# Patient Record
Sex: Female | Born: 1955
Health system: Southern US, Community
[De-identification: ages and names within clinical notes are randomized; demographics above are authoritative.]

## PROBLEM LIST (undated history)

## (undated) DIAGNOSIS — G4700 Insomnia, unspecified: Secondary | ICD-10-CM

## (undated) DIAGNOSIS — I493 Ventricular premature depolarization: Secondary | ICD-10-CM

## (undated) DIAGNOSIS — M8580 Other specified disorders of bone density and structure, unspecified site: Secondary | ICD-10-CM

## (undated) DIAGNOSIS — H269 Unspecified cataract: Secondary | ICD-10-CM

## (undated) DIAGNOSIS — I10 Essential (primary) hypertension: Secondary | ICD-10-CM

## (undated) DIAGNOSIS — E785 Hyperlipidemia, unspecified: Secondary | ICD-10-CM

## (undated) DIAGNOSIS — K219 Gastro-esophageal reflux disease without esophagitis: Secondary | ICD-10-CM

## (undated) DIAGNOSIS — E282 Polycystic ovarian syndrome: Secondary | ICD-10-CM

## (undated) DIAGNOSIS — F32A Depression, unspecified: Secondary | ICD-10-CM

## (undated) DIAGNOSIS — I341 Nonrheumatic mitral (valve) prolapse: Secondary | ICD-10-CM

## (undated) DIAGNOSIS — Z973 Presence of spectacles and contact lenses: Secondary | ICD-10-CM

## (undated) DIAGNOSIS — R5383 Other fatigue: Secondary | ICD-10-CM

## (undated) DIAGNOSIS — R7303 Prediabetes: Secondary | ICD-10-CM

## (undated) DIAGNOSIS — M1990 Unspecified osteoarthritis, unspecified site: Secondary | ICD-10-CM

## (undated) DIAGNOSIS — F419 Anxiety disorder, unspecified: Secondary | ICD-10-CM

## (undated) DIAGNOSIS — R739 Hyperglycemia, unspecified: Secondary | ICD-10-CM

## (undated) DIAGNOSIS — R011 Cardiac murmur, unspecified: Secondary | ICD-10-CM

## (undated) DIAGNOSIS — T7840XA Allergy, unspecified, initial encounter: Secondary | ICD-10-CM

## (undated) DIAGNOSIS — K589 Irritable bowel syndrome without diarrhea: Secondary | ICD-10-CM

## (undated) DIAGNOSIS — M797 Fibromyalgia: Secondary | ICD-10-CM

## (undated) DIAGNOSIS — E039 Hypothyroidism, unspecified: Secondary | ICD-10-CM

## (undated) DIAGNOSIS — F329 Major depressive disorder, single episode, unspecified: Secondary | ICD-10-CM

## (undated) HISTORY — DX: Other fatigue: R53.83

## (undated) HISTORY — DX: Hyperglycemia, unspecified: R73.9

## (undated) HISTORY — DX: Polycystic ovarian syndrome: E28.2

## (undated) HISTORY — PX: ABDOMINAL HYSTERECTOMY: SHX81

## (undated) HISTORY — DX: Allergy, unspecified, initial encounter: T78.40XA

## (undated) HISTORY — PX: CHOLECYSTECTOMY: SHX55

## (undated) HISTORY — PX: TONSILLECTOMY: SUR1361

## (undated) HISTORY — DX: Depression, unspecified: F32.A

## (undated) HISTORY — DX: Irritable bowel syndrome, unspecified: K58.9

## (undated) HISTORY — DX: Essential (primary) hypertension: I10

## (undated) HISTORY — DX: Presence of spectacles and contact lenses: Z97.3

## (undated) HISTORY — DX: Anxiety disorder, unspecified: F41.9

## (undated) HISTORY — DX: Hypothyroidism, unspecified: E03.9

## (undated) HISTORY — DX: Major depressive disorder, single episode, unspecified: F32.9

## (undated) HISTORY — PX: COLONOSCOPY: SHX174

## (undated) HISTORY — DX: Nonrheumatic mitral (valve) prolapse: I34.1

## (undated) HISTORY — DX: Unspecified cataract: H26.9

## (undated) HISTORY — DX: Prediabetes: R73.03

## (undated) HISTORY — DX: Gastro-esophageal reflux disease without esophagitis: K21.9

## (undated) HISTORY — PX: OVARY SURGERY: SHX727

## (undated) HISTORY — DX: Insomnia, unspecified: G47.00

## (undated) HISTORY — DX: Unspecified osteoarthritis, unspecified site: M19.90

## (undated) HISTORY — DX: Other specified disorders of bone density and structure, unspecified site: M85.80

## (undated) HISTORY — DX: Ventricular premature depolarization: I49.3

## (undated) HISTORY — DX: Cardiac murmur, unspecified: R01.1

## (undated) HISTORY — DX: Fibromyalgia: M79.7

## (undated) HISTORY — DX: Hyperlipidemia, unspecified: E78.5

---

## 1997-10-15 ENCOUNTER — Other Ambulatory Visit: Admission: RE | Admit: 1997-10-15 | Discharge: 1997-10-15 | Payer: Self-pay | Admitting: *Deleted

## 1998-09-10 ENCOUNTER — Ambulatory Visit: Admission: RE | Admit: 1998-09-10 | Discharge: 1998-09-10 | Payer: Self-pay | Admitting: Family Medicine

## 1998-11-06 ENCOUNTER — Other Ambulatory Visit: Admission: RE | Admit: 1998-11-06 | Discharge: 1998-11-06 | Payer: Self-pay | Admitting: *Deleted

## 1999-12-01 ENCOUNTER — Other Ambulatory Visit: Admission: RE | Admit: 1999-12-01 | Discharge: 1999-12-01 | Payer: Self-pay | Admitting: *Deleted

## 2000-12-14 ENCOUNTER — Other Ambulatory Visit: Admission: RE | Admit: 2000-12-14 | Discharge: 2000-12-14 | Payer: Self-pay | Admitting: *Deleted

## 2000-12-26 ENCOUNTER — Encounter: Admission: RE | Admit: 2000-12-26 | Discharge: 2000-12-26 | Payer: Self-pay | Admitting: *Deleted

## 2000-12-26 ENCOUNTER — Encounter: Payer: Self-pay | Admitting: *Deleted

## 2001-07-27 ENCOUNTER — Encounter: Admission: RE | Admit: 2001-07-27 | Discharge: 2001-07-27 | Payer: Self-pay | Admitting: Family Medicine

## 2001-07-27 ENCOUNTER — Encounter: Payer: Self-pay | Admitting: Family Medicine

## 2001-08-07 ENCOUNTER — Encounter: Admission: RE | Admit: 2001-08-07 | Discharge: 2001-08-07 | Payer: Self-pay | Admitting: Family Medicine

## 2001-08-07 ENCOUNTER — Encounter: Payer: Self-pay | Admitting: Family Medicine

## 2001-12-19 ENCOUNTER — Other Ambulatory Visit: Admission: RE | Admit: 2001-12-19 | Discharge: 2001-12-19 | Payer: Self-pay | Admitting: *Deleted

## 2002-08-22 ENCOUNTER — Emergency Department (HOSPITAL_COMMUNITY): Admission: EM | Admit: 2002-08-22 | Discharge: 2002-08-22 | Payer: Self-pay | Admitting: Emergency Medicine

## 2002-08-22 ENCOUNTER — Encounter: Payer: Self-pay | Admitting: Emergency Medicine

## 2002-12-18 ENCOUNTER — Encounter: Payer: Self-pay | Admitting: *Deleted

## 2002-12-18 ENCOUNTER — Encounter: Admission: RE | Admit: 2002-12-18 | Discharge: 2002-12-18 | Payer: Self-pay | Admitting: *Deleted

## 2002-12-25 ENCOUNTER — Other Ambulatory Visit: Admission: RE | Admit: 2002-12-25 | Discharge: 2002-12-25 | Payer: Self-pay | Admitting: *Deleted

## 2003-05-28 ENCOUNTER — Ambulatory Visit (HOSPITAL_COMMUNITY): Admission: RE | Admit: 2003-05-28 | Discharge: 2003-05-28 | Payer: Self-pay | Admitting: Family Medicine

## 2003-11-28 ENCOUNTER — Emergency Department (HOSPITAL_COMMUNITY): Admission: EM | Admit: 2003-11-28 | Discharge: 2003-11-28 | Payer: Self-pay | Admitting: Emergency Medicine

## 2003-12-19 ENCOUNTER — Encounter: Admission: RE | Admit: 2003-12-19 | Discharge: 2003-12-19 | Payer: Self-pay | Admitting: *Deleted

## 2004-01-16 ENCOUNTER — Other Ambulatory Visit: Admission: RE | Admit: 2004-01-16 | Discharge: 2004-01-16 | Payer: Self-pay | Admitting: *Deleted

## 2004-12-29 ENCOUNTER — Encounter: Admission: RE | Admit: 2004-12-29 | Discharge: 2004-12-29 | Payer: Self-pay | Admitting: *Deleted

## 2005-06-03 ENCOUNTER — Encounter: Admission: RE | Admit: 2005-06-03 | Discharge: 2005-06-03 | Payer: Self-pay | Admitting: Family Medicine

## 2005-10-21 ENCOUNTER — Encounter: Admission: RE | Admit: 2005-10-21 | Discharge: 2005-10-21 | Payer: Self-pay | Admitting: Family Medicine

## 2006-02-10 ENCOUNTER — Other Ambulatory Visit: Admission: RE | Admit: 2006-02-10 | Discharge: 2006-02-10 | Payer: Self-pay | Admitting: *Deleted

## 2006-06-30 ENCOUNTER — Ambulatory Visit (HOSPITAL_COMMUNITY): Admission: RE | Admit: 2006-06-30 | Discharge: 2006-06-30 | Payer: Self-pay | Admitting: *Deleted

## 2006-11-09 ENCOUNTER — Inpatient Hospital Stay (HOSPITAL_COMMUNITY): Admission: EM | Admit: 2006-11-09 | Discharge: 2006-11-11 | Payer: Self-pay | Admitting: Emergency Medicine

## 2008-02-15 ENCOUNTER — Encounter: Admission: RE | Admit: 2008-02-15 | Discharge: 2008-02-15 | Payer: Self-pay | Admitting: Family Medicine

## 2008-11-21 ENCOUNTER — Encounter: Admission: RE | Admit: 2008-11-21 | Discharge: 2008-11-21 | Payer: Self-pay | Admitting: Family Medicine

## 2008-12-26 ENCOUNTER — Encounter: Admission: RE | Admit: 2008-12-26 | Discharge: 2008-12-26 | Payer: Self-pay | Admitting: Family Medicine

## 2009-08-17 ENCOUNTER — Encounter: Admission: RE | Admit: 2009-08-17 | Discharge: 2009-08-17 | Payer: Self-pay | Admitting: Family Medicine

## 2009-12-17 ENCOUNTER — Encounter: Admission: RE | Admit: 2009-12-17 | Discharge: 2009-12-17 | Payer: Self-pay | Admitting: Obstetrics and Gynecology

## 2010-09-01 NOTE — H&P (Signed)
Glenda Evans, Glenda Evans              ACCOUNT NO.:  [id number]   MEDICAL RECORD NO.:  [id number]          PATIENT TYPE:  INP   LOCATION:  6734                         FACILITY:  [facility]   PHYSICIAN:  Corinna L. Lendell Caprice, MDDATE OF BIRTH:  1956-04-04   DATE OF ADMISSION:  11/09/2006  DATE OF DISCHARGE:                              HISTORY & PHYSICAL   CHIEF COMPLAINT:  Diarrhea and fainting spell.   HISTORY OF PRESENT ILLNESS:  Glenda Evans is a pleasant 55 year old white  female patient of Dr. Tiburcio Pea who presents to the emergency room with the  above complaints.  She cares for her 33-month-old [place] who was  recently diagnosed with C. difficile colitis.  Also the patient's mother  died of C. difficile related issues last year.  The patient reports that  beginning 2 days ago, she began having diarrhea approximately 20-30  times that day.  The subsequent day, she had diffuse abdominal pain.  Her appetite has been poor and she has been nauseated.  She has not  vomited.  She has been trying to drink liquids.  She has subjective  fevers and chills.  She drinks well water.  No recent travel.  No  previous similar episodes.  On Monday, she had maybe a bit of blood in  the stool but none since.  It has been watery.  Her diarrhea diminished  yesterday, but this morning about 4:00 a.m. she got up to go the  bathroom and apparently she had a syncopal episode, and when she awoke  she was covered in loose stool.  She also had a syncopal episode in  triage for a brief amount of time.  Currently she is feeling nauseated  and having quite a bit of abdominal pain.  She rates it 7 out of 10.   PAST MEDICAL HISTORY:  Depression, fibromyalgia, gastroesophageal reflux  disease, mitral valve prolapse, hypothyroidism.   MEDICATIONS:  1. Verapamil dose unknown.  2. Synthroid 112 mcg a day.  3. Wellbutrin XL 150 mg a day.  4. Prilosec 40 mg a day.   ALLERGIES:  She reports allergies.  She reports a rash  with SULFA.  She  reports an allergy with FLOXIN, but she is able to take Cipro and other  fluoroquinolones.  She reports an allergy to [facility].  She reports an  allergy to AUGMENTIN, but is able to take amoxicillin and other  penicillins.  These allergies consist of a swollen tongue, shortness of  breath, throat tightening.   SOCIAL HISTORY:  She is engaged to be married.  She does not smoke,  drink or use drugs.   FAMILY HISTORY:  As above.   SURGICAL HISTORY:  She has had a hysterectomy, cholecystectomy, C-  section.   REVIEW OF SYSTEMS:  As above otherwise negative.   PHYSICAL EXAMINATION:  VITAL SIGNS:  Her temperature is 97.5, blood  pressure of 90/57, pulse 86, respiratory rate 14, oxygen saturation 97.  Her blood pressure is currently 118/75.  GENERAL:  In general the patient is an ill-appearing white female.  HEENT: Normocephalic, atraumatic.  Pupils equal, round and reactive to  light.  Sclerae nonicteric.  Conjunctivae pink.  She has moist mucous  membranes.  Oropharynx is without erythema or exudate.  NECK:  Neck is supple.  No lymphadenopathy.  LUNGS:  Lungs are clear to auscultation bilaterally without wheezes,  rhonchi or rales.  CARDIOVASCULAR:  Regular rate and rhythm without murmurs, gallops or  rubs.  ABDOMEN:  Normal bowel sounds, soft.  She is diffusely tender with some  mild rebound tenderness, worse on the left lower quadrant.  GU AND RECTAL:  Deferred.  EXTREMITIES: No clubbing, cyanosis or edema.  SKIN:  No rash.  PSYCHIATRIC:  Normal affect.  NEUROLOGIC:  Alert and oriented.  Cranial nerves and sensorimotor exam  are intact.   LABORATORIES:  CBC is essentially normal.  Basic metabolic panel  significant for a bicarbonate of 19, potassium of 3.2, otherwise  unremarkable.   ASSESSMENT/PLAN:  1. Syncope probably secondary to hypotension, dehydration.  The      patient will get IV fluids.  She will be admitted to the hospital.  2. Abdominal pain  with diarrhea.  Suspect Clostridium difficile      colitis.  The patient has received a dose of IV Flagyl.  I will try      to change this to oral if at all possible.  She will be on contact      precautions.  We will check stool for Clostridium difficile.  I      will also check stool for Giardia and Cryptosporidia with her      history of well water consumption.  Also check stool culture.  She      is having quite a bit of pain and nausea, so she will get Dilaudid      as needed and Phenergan as needed.  She is quite tender.  I suspect      it is from colitis, but I will check a CT abdomen, pelvis to rule      out other intra-abdominal process.  3. Mitral valve prolapse.  Hold verapamil.  4. Gastroesophageal reflux disease.  Give IV Protonix for now.  5. Depression.  Continue Wellbutrin if tolerated.  6. Hypothyroidism.  Check TSH and continue Synthroid if tolerated.  7. Fibromyalgia, stable.  8. Hypokalemia.  This will be repleted IV.      Corinna L. Lendell Caprice, MD  Electronically Signed     CLS/MEDQ  D:  11/09/2006  T:  11/09/2006  Job:  [id number]   cc:   Holley Bouche, M.D.

## 2010-09-01 NOTE — Discharge Summary (Signed)
Glenda Evans, Glenda Evans              ACCOUNT NO.:  [id number]   MEDICAL RECORD NO.:  [id number]          PATIENT TYPE:  INP   LOCATION:  6734                         FACILITY:  [facility]   PHYSICIAN:  Corinna L. Lendell Caprice, MDDATE OF BIRTH:  April 27, 1955   DATE OF ADMISSION:  11/09/2006  DATE OF DISCHARGE:  11/11/2006                               DISCHARGE SUMMARY   DISCHARGE DIAGNOSES:  1. Syncope.  2. Infectious enteritis with diarrhea  3. Hypokalemia.  4. Hypothyroidism.  5. Gastroesophageal reflux disease  6. Mitral valve prolapse.  7. History of depression.  8. Fibromyalgia.   DISCHARGE MEDICATIONS:  1. Phenergan 12-1/2 mg p.o. q.6 h p.r.n. nausea  2. Ciprofloxacin 500 mg p.o. b.i.d. until gone  3. Metronidazole 250 mg p.o. four times a week, until gone.  4. She may resume her verapamil, Synthroid, Wellbutrin and Prilosec as      previous.  See H&P.   CONDITION:  Stable.   ACTIVITY:  Ad lib.   DIET:  Should be as tolerated with adequate fluid intake, avoid dairy  products for a week.   FOLLOW UP:  Follow with Dr. Tiburcio Pea next week to check serum potassium  and follow up symptoms.   CONSULTATIONS:  None.   PROCEDURES:  None.   PERTINENT LABORATORY DATA:  On admission, CBC unremarkable.  Basic  metabolic panel significant for potassium of 3.2.  Liver function tests  significant for an SGOT of 39, SGPT of 53, total protein 5.4, albumin  3.1, lipase normal.  TSH 1.579.  Fecal lactoferrin positive.  Giardia  screen negative.  Cryptosporidium screen negative, C diff toxin negative  x1.  Stool culture negative to date though results or not final.   SPECIAL STUDIES AND RADIOLOGY:  CT of the abdomen and pelvis showed  dilated, mildly thickened loops of small bowel with small amount of free  fluid, nonspecific, may be enteritis.   HISTORY AND HOSPITAL COURSE:  Glenda Evans is a pleasant 55 year old  white female, patient of Dr. Tiburcio Pea who had profuse diarrhea 2 days  prior to  admission.  She also had anorexia and nausea.  Her  granddaughter was recently diagnosed with Salmonella and apparently they  all ate at the same [place].  The granddaughter was  subsequently diagnosed with Clostridium difficile.  The patient's  diarrhea had improved but on the morning of admission she had a syncopal  episode while in the bathroom and when she awoke she had loose stool.  Her blood pressure was borderline in the emergency room.  She was alert  and oriented but was feeling very weak. She had also described some  abdominal pain.  She is on verapamil dose unknown, for mitral valve  prolapse.  Her other vital signs were normal.  She had moist mucous  membranes but appeared weak and ill.  She had diffuse abdominal  tenderness with some mild rebound tenderness worst in the left lower  quadrant.  She was started empirically on Flagyl.  CAT scan showed the  enteritis.  She subsequently was also started on ciprofloxacin.  Her  diarrhea did not return since  the morning of admission but she continued  to have abdominal pain and nausea.  She was started initially on clear  liquid diet.  She reports an allergy to Floxin but has been able to take  Cipro in the past and has suffered no ill effects.  On the day of  discharge she was tolerating a solid diet.  She was feeling better.  She  was ambulating without dizziness and her abdomen was less tender.  I  have repleted her potassium IV n.p.o. and she will need to have a  potassium level checked again in a week.  I will continue empiric  antibiotics and follow up the final results of the stool culture.      Corinna L. Lendell Caprice, MD  Electronically Signed     CLS/MEDQ  D:  11/11/2006  T:  11/11/2006  Job:  [id number]   cc:   Holley Bouche, M.D.

## 2010-11-18 ENCOUNTER — Encounter (INDEPENDENT_AMBULATORY_CARE_PROVIDER_SITE_OTHER): Payer: Self-pay | Admitting: General Surgery

## 2010-11-19 ENCOUNTER — Encounter (INDEPENDENT_AMBULATORY_CARE_PROVIDER_SITE_OTHER): Payer: Self-pay | Admitting: General Surgery

## 2010-11-19 ENCOUNTER — Ambulatory Visit (INDEPENDENT_AMBULATORY_CARE_PROVIDER_SITE_OTHER): Payer: Medicare Other | Admitting: General Surgery

## 2010-11-19 VITALS — BP 142/98 | HR 76 | Temp 97.6°F | Ht 65.0 in | Wt 192.6 lb

## 2010-11-19 DIAGNOSIS — D1739 Benign lipomatous neoplasm of skin and subcutaneous tissue of other sites: Secondary | ICD-10-CM

## 2010-11-19 DIAGNOSIS — D1720 Benign lipomatous neoplasm of skin and subcutaneous tissue of unspecified limb: Secondary | ICD-10-CM

## 2010-11-19 NOTE — Progress Notes (Signed)
Glenda Evans is a 55 y.o. female.    Chief Complaint  Patient presents with  . Other    eval painful lipoma Lt leg    HPI HPI 55 year old obese Caucasian female referred by Dr. Johny Blamer for evaluation of bilateral thigh lipomas. She states that she's had lipomas for several years. However over the past couple months the masses  have been  causing more and more discomfort. It causes discomfort all the time. She complains of numbness and weakness down both legs. She denies any weight loss. She denies any family or personal history of soft tissue cancers such as sarcomas. The lumps that cause her the most discomfort are on the left anterior thigh and the right inner and anterior thigh. She denies any night sweats or chills. She also has a lump on her right hip area but it does not bother her at the current time.  Past Medical History  Diagnosis Date  . Fibromyalgia   . Mitral valve prolapse   . Hypothyroid   . Depression   . IBS (irritable bowel syndrome)   . Asthma   . Migraine   . GERD (gastroesophageal reflux disease)   . Stein-Leventhal syndrome   . Fatigue   . Arthritis   . Wears glasses     Past Surgical History  Procedure Date  . Cesarean section   . Tonsillectomy   . Abdominal hysterectomy   . Cholecystectomy     Family History  Problem Relation Age of Onset  . COPD Father   . Hypertension Father   . Diabetes Sister   . Cancer Maternal Grandmother     breast cancer    Social History History  Substance Use Topics  . Smoking status: Never Smoker   . Smokeless tobacco: Not on file  . Alcohol Use: No    Allergies  Allergen Reactions  . Augmentin   . Cymbalta (Duloxetine Hcl)   . Erythromycin   . Floxin Otic   . Lexapro   . Lyrica (Pregabalin)   . Macrodantin   . Neurontin (Gabapentin)   . Pamelor   . Relafen (Nabumetone)   . Septra (Bactrim)   . Skelaxin   . Sulfa Antibiotics     Current Outpatient Prescriptions  Medication Sig  Dispense Refill  . buPROPion (WELLBUTRIN XL) 300 MG 24 hr tablet Take 300 mg by mouth daily.        . calcium carbonate (TUMS - DOSED IN MG ELEMENTAL CALCIUM) 500 MG chewable tablet Chew 1 tablet by mouth 3 (three) times daily.        . Cholecalciferol (VITAMIN D) 2000 UNITS CAPS Take by mouth once a week.       . citalopram (CELEXA) 20 MG tablet Take 20 mg by mouth daily.        . clonazePAM (KLONOPIN) 0.5 MG tablet Take 0.5 mg by mouth 2 (two) times daily as needed.        Marland Kitchen estradiol (ESTRACE) 1 MG tablet Take 1 mg by mouth daily.        Marland Kitchen levothyroxine (SYNTHROID, LEVOTHROID) 112 MCG tablet Take 112 mcg by mouth daily.        Marland Kitchen omeprazole (PRILOSEC) 20 MG capsule Take 20 mg by mouth daily.        . verapamil (CALAN) 120 MG tablet Take 120 mg by mouth 3 (three) times daily.        Marland Kitchen albuterol (PROVENTIL) 90 MCG/ACT inhaler Inhale 2 puffs into the lungs  as needed.       . fish oil-omega-3 fatty acids 1000 MG capsule Take 2 g by mouth daily.          Review of Systems Review of Systems  Constitutional: Positive for malaise/fatigue. Negative for fever, chills and weight loss.  HENT: Negative for nosebleeds and congestion.   Eyes: Negative for blurred vision and double vision.       +glasses  Respiratory: Negative for shortness of breath and wheezing.        H/o asthma, uses inhaler occasionally. No recent hospitalizations for asthma  Cardiovascular: Negative for chest pain, orthopnea, leg swelling and PND.       Some DOE  Gastrointestinal: Positive for heartburn. Negative for diarrhea, constipation, blood in stool and melena.  Genitourinary: Negative for dysuria and hematuria.  Musculoskeletal: Positive for joint pain.  Skin:       See hpi  Neurological: Positive for headaches (occassional). Negative for dizziness, sensory change, focal weakness and seizures.  Endo/Heme/Allergies: Does not bruise/bleed easily.  Psychiatric/Behavioral: Positive for depression. Negative for suicidal  ideas and substance abuse.    Physical Exam Physical Exam  Vitals reviewed. Constitutional: She is oriented to person, place, and time. She appears well-developed and well-nourished.       obese  HENT:  Head: Normocephalic and atraumatic.  Eyes: Conjunctivae are normal. No scleral icterus.  Neck: Normal range of motion. Neck supple.  Cardiovascular: Normal rate, regular rhythm, normal heart sounds and intact distal pulses.   Respiratory: Effort normal and breath sounds normal. No respiratory distress. She has no wheezes.  GI: Soft. Bowel sounds are normal. She exhibits no distension and no mass. There is no tenderness.       Well low transverse skin incision  Musculoskeletal: Normal range of motion.  Lymphadenopathy:    She has no cervical adenopathy.       Right: No inguinal and no supraclavicular adenopathy present.       Left: No inguinal and no supraclavicular adenopathy present.  Neurological: She is alert and oriented to person, place, and time.  Skin: Skin is warm and dry. No rash noted. No erythema.     Psychiatric: She has a normal mood and affect. Her behavior is normal. Judgment normal.     Blood pressure 142/98, pulse 76, temperature 97.6 F (36.4 C), height 5\' 5"  (1.651 m), weight 192 lb 9.6 oz (87.363 kg).   Data reviewed: I reviewed Dr. Johny Blamer note from November 09, 2010  Assessment/Plan 55 year old obese Caucasian female with fibromyalgia, hypothyroidism, depression, remote history of asthma, gastroesophageal reflux disease, vitamin D deficiency, and bilateral thigh subcutaneous masses most consistent with lipomas on physical exam  We discussed operative and nonoperative management. The patient is interested in  surgical excision of these 3 lesions. We discussed the risk and benefits of surgery including but not limited to bleeding, infection, injury to surrounding structures, hematoma formation, seroma formation, blood clot formation, scarring and the rare  remote possibility of cancer being detected in these masses.  We discussed the typical postoperative recovery course. We will plan on excising the left thigh, right inner thigh, and right anterior thigh subcutaneous lipomas.  I explained to her that I cannot guarantee that the numbness and weakness that she is experiencing in her legs will improve with excision of these lipomas.  Gaynelle Adu M 11/19/2010, 9:54 AM

## 2010-11-19 NOTE — Patient Instructions (Signed)

## 2010-11-25 ENCOUNTER — Encounter (HOSPITAL_BASED_OUTPATIENT_CLINIC_OR_DEPARTMENT_OTHER)
Admission: RE | Admit: 2010-11-25 | Discharge: 2010-11-25 | Disposition: A | Discharge status: Home / Self Care | Payer: Medicare Other | Source: Ambulatory Visit | Attending: General Surgery | Admitting: General Surgery

## 2010-11-25 LAB — CBC
HCT: 40.2 % (ref 36.0–46.0)
MCHC: 33.6 g/dL (ref 30.0–36.0)
Platelets: 283 10*3/uL (ref 150–400)

## 2010-11-25 LAB — DIFFERENTIAL
Eosinophils Relative: 2 % (ref 0–5)
Lymphocytes Relative: 31 % (ref 12–46)
Lymphs Abs: 2.3 10*3/uL (ref 0.7–4.0)
Monocytes Absolute: 0.5 10*3/uL (ref 0.1–1.0)
Monocytes Relative: 6 % (ref 3–12)
Neutrophils Relative %: 61 % (ref 43–77)

## 2010-11-25 LAB — BASIC METABOLIC PANEL
Potassium: 4.3 mEq/L (ref 3.5–5.1)
Sodium: 140 mEq/L (ref 135–145)

## 2010-11-26 ENCOUNTER — Ambulatory Visit (HOSPITAL_BASED_OUTPATIENT_CLINIC_OR_DEPARTMENT_OTHER)
Admission: RE | Admit: 2010-11-26 | Discharge: 2010-11-26 | Disposition: A | Discharge status: Home / Self Care | Payer: Medicare Other | Source: Ambulatory Visit | Attending: General Surgery | Admitting: General Surgery

## 2010-11-26 ENCOUNTER — Other Ambulatory Visit (INDEPENDENT_AMBULATORY_CARE_PROVIDER_SITE_OTHER): Payer: Self-pay | Admitting: General Surgery

## 2010-11-26 DIAGNOSIS — Z01810 Encounter for preprocedural cardiovascular examination: Secondary | ICD-10-CM | POA: Insufficient documentation

## 2010-11-26 DIAGNOSIS — D1739 Benign lipomatous neoplasm of skin and subcutaneous tissue of other sites: Secondary | ICD-10-CM | POA: Insufficient documentation

## 2010-11-26 DIAGNOSIS — Z01812 Encounter for preprocedural laboratory examination: Secondary | ICD-10-CM | POA: Insufficient documentation

## 2010-11-26 HISTORY — PX: LIPOMA EXCISION: SHX5283

## 2010-11-28 NOTE — Op Note (Signed)
[name], Glenda Evans              ACCOUNT NO.:  [id number]  MEDICAL RECORD NO.:  [id number]  LOCATION:                                 FACILITY:  PHYSICIAN:  Mary Sella. Andrey Campanile, MD     DATE OF BIRTH:  02/16/1956  DATE OF PROCEDURE:  11/26/2010 DATE OF DISCHARGE:                              OPERATIVE REPORT   PREOPERATIVE DIAGNOSIS:  Bilateral thighs subcutaneous masses.  POSTOPERATIVE DIAGNOSIS:  Bilateral thigh lipomas.  PROCEDURE: 1. Excision of left anterior thigh lipoma with two-layer closure. 2. Excision of right medial thigh lipoma x2, each with two-layer     closure.  SURGEON:  Mary Sella. Andrey Campanile, MD  ASSISTANT SURGEON:  None.  ANESTHESIA:  General with LMA plus local consisting of 0.25% Marcaine with epi.  SPECIMEN: 1. Left thigh subcutaneous mass. 2. Right thigh subcutaneous masses.  FINDINGS: 1. The left leg incision was 4 cm long. 2. The right medial thigh most medial incision was 2 cm and the outer     incision was 3 cm long.  ESTIMATED BLOOD LOSS:  Minimal.  INDICATIONS FOR PROCEDURE:  The patient is a 55 year old female who has had a longstanding history of subcutaneous masses in her lower legs over the past couple of months and has been causing more and more discomfort. Now, she has constant discomfort mainly over the area where the lumps were located.  She denies any weight loss, night sweats or fevers or chills.  She denies any family or personal history of soft tissue cancer.  We discussed the risks and benefits of surgery including but not limited to bleeding, infection, injury to surrounding structures, failure to ameliorate her discomfort, hematoma formation, seroma formation, scarring, possible need for additional procedures as well as typical postoperative course as well as general anesthesia risk.  She elects to proceed to the operating room.  DESCRIPTION OF PROCEDURE:  The bilateral thighs were marked in the holding area with the patient  confirming the operative site.  I had her re-identify the lesion, the subcutaneous masses that were particularly bothering her and these were outlined in the holding bay.  She was then taken into the operating room, placed supine on the operating room table.  General LMA anesthesia was established.  Her bilateral thighs and inner thighs were prepped and draped in usual standard surgical fashion with ChloraPrep.  She received antibiotics prior to skin incision.  Surgical time-out was performed.  Initially, I started on the left thigh lesion in anterior medial position several inches above the kneecap.  I made a transverse incision directly over the lesion of concern.  This was about 4 cm.  I then was able to express the lipoma up out of the incision.  I then freed it up from the surrounding tissue with the Bovie electrocautery.  I probed the cavity with my finger. There was one additional little lipoma underneath the inferior skin edge.  I was able to express this by palpating on the skin.  It essentially popped out.  The cavity was irrigated.  Hemostasis was achieved.  I injected local in the deep dermis, reapproximated the skin in two layers, the first layer being a deep dermis  with inverted interrupted 3-0 Vicryls, the skin with a running 4-0 Monocryl in a subcuticular fashion, followed by Benzoin, Steri-Strips, a 4x4 and a Tegaderm.  I then turned my attention to the right leg.  There was one in the medial distal thigh.  I made again transverse incision directly over it of approximately 2 cm in length, was able to express the lipoma from underneath the skin.  Irrigated the wound.  The other lesion was slightly more lateral near the right anterior thigh.  I made a 3-cm incision through the skin with a #15 blade, lifted up the skin edges with the Adson and took some Bovie electrocautery to lift the skin edges both superior and inferiorly.  I again was able to express the lipoma. There  was one lipoma that was further lateral and I was able to tunnel underneath the skin and express it through the same defect.  Both incisions were irrigated.  Local was infiltrated.  I then closed each incision with interrupted inverted 3-0 Vicryl for the skin with a running 4-0 Monocryl in subcuticular fashion followed by Steri-Strips, 2 x2s and 2 Tegaderms.  The patient was extubated and taken to the recovery room in stable addition.  There were no immediate complications.  The patient tolerated the procedure well.  All needle and sponge counts were correct x 2.     Mary Sella. Andrey Campanile, MD     EMW/MEDQ  D:  11/26/2010  T:  11/26/2010  Job:  [id number]  cc:   Melida Quitter, M.D.  Electronically Signed by Gaynelle Adu M.D. on 11/28/2010 12:13:43 PM

## 2010-11-30 ENCOUNTER — Telehealth (INDEPENDENT_AMBULATORY_CARE_PROVIDER_SITE_OTHER): Payer: Self-pay | Admitting: General Surgery

## 2010-11-30 NOTE — Telephone Encounter (Signed)
Message copied by Liliana Cline on Mon Nov 30, 2010  9:01 AM ------      Message from: Andrey Campanile, ERIC M      Created: Fri Nov 27, 2010  9:02 PM       Call pt with path report.

## 2010-11-30 NOTE — Telephone Encounter (Signed)
Spoke with patient. Advised this was a benign lipoma, no cancer. Patient is doing well. Had some nausea after surgery. I advised that is normal right after surgery, especially if taking pain medicine. Patient has now stopped pain medicine and has not felt sick today. I advised if she feels sick again to give [place] a call, but to keep an eye on this. She will call back with any problems and follow up at her scheduled appt.

## 2010-12-07 ENCOUNTER — Telehealth (INDEPENDENT_AMBULATORY_CARE_PROVIDER_SITE_OTHER): Payer: Self-pay | Admitting: General Surgery

## 2010-12-07 ENCOUNTER — Encounter (INDEPENDENT_AMBULATORY_CARE_PROVIDER_SITE_OTHER): Payer: Self-pay | Admitting: General Surgery

## 2010-12-07 ENCOUNTER — Ambulatory Visit (INDEPENDENT_AMBULATORY_CARE_PROVIDER_SITE_OTHER): Payer: Medicare Other | Admitting: General Surgery

## 2010-12-07 VITALS — Temp 97.1°F

## 2010-12-07 DIAGNOSIS — D1739 Benign lipomatous neoplasm of skin and subcutaneous tissue of other sites: Secondary | ICD-10-CM

## 2010-12-07 DIAGNOSIS — Z09 Encounter for follow-up examination after completed treatment for conditions other than malignant neoplasm: Secondary | ICD-10-CM

## 2010-12-07 DIAGNOSIS — D1720 Benign lipomatous neoplasm of skin and subcutaneous tissue of unspecified limb: Secondary | ICD-10-CM

## 2010-12-07 NOTE — Telephone Encounter (Signed)
Patient called in stating she developed bilateral itchy red rash on lower extremities, patient given appointment with Dr. Andrey Campanile for today (12/07/10)

## 2010-12-07 NOTE — Patient Instructions (Signed)
Can take motrin or aleve for Left wrist discomfort. Also try warm packs.  Can also take benadryl for itching.

## 2010-12-07 NOTE — Progress Notes (Signed)
Procedure: Status post excision of bilateral thigh lipomas November 26, 2010  History of Present Ilness: 55 year old Caucasian female comes in for her first postoperative appointment. She states the first couple days went okay. However over the weekend she developed some redness and itching over all 3 incision sites. The redness has gone away now. She denies any fevers or chills. She took Benadryl which did give her some relief. She also reports some bloody drainage from the lateral right thigh incision that happened on Sunday. She also complains of some left wrist discomfort. She states that is where her IV site was located. She denies any redness to the area. She denies any swelling.  Physical Exam: Temp(Src) 97.1 F (36.2 C) (Temporal)  Well-developed well-nourished obese Caucasian female in no apparent distress Left wrist-no edema. Palpable pulse. Small bruise on the dorsum of the left hand. Slightly tender to palpation. No gross deformity. Skin-bilateral thigh incisions are well approximated. Steri-Strips have been removed. There is no cellulitis or fluctuance. There is no induration. There may be a little bit of fluid underneath the right lateral thigh incision.  Pathology: Bilateral thigh subcutaneous masses were consistent with lipomas. No evidence of atypia or malignancy  Assessment and Plan: Status post excision of bilateral thigh lipomas. We discussed the pathology report. It appears that she might have a contact dermatitis reaction to Steri-Strips. I've asked her to add this to her allergy list. There's no sign of infection. I advised her to take Benadryl as needed for itching. I also encouraged her to take Motrin or Aleve for her left wrist discomfort. I believe this will improve with time. I will see her in 4 weeks

## 2010-12-10 ENCOUNTER — Encounter (INDEPENDENT_AMBULATORY_CARE_PROVIDER_SITE_OTHER): Payer: Medicare Other | Admitting: General Surgery

## 2011-01-06 ENCOUNTER — Ambulatory Visit (INDEPENDENT_AMBULATORY_CARE_PROVIDER_SITE_OTHER): Payer: Medicare Other | Admitting: General Surgery

## 2011-01-06 ENCOUNTER — Encounter (INDEPENDENT_AMBULATORY_CARE_PROVIDER_SITE_OTHER): Payer: Medicare Other | Admitting: General Surgery

## 2011-01-06 ENCOUNTER — Encounter (INDEPENDENT_AMBULATORY_CARE_PROVIDER_SITE_OTHER): Payer: Self-pay | Admitting: General Surgery

## 2011-01-06 VITALS — BP 116/88 | HR 60 | Temp 97.2°F | Resp 18 | Ht 65.0 in | Wt 194.0 lb

## 2011-01-06 DIAGNOSIS — Z09 Encounter for follow-up examination after completed treatment for conditions other than malignant neoplasm: Secondary | ICD-10-CM

## 2011-01-06 NOTE — Progress Notes (Signed)
Procedure: Status post excision of bilateral thigh lipomas November 26, 2010  History of Present Ilness: 55 year old Caucasian female comes in for her second postoperative appointment. She states she is doing well. She denies any drainage or swelling around her incisions.  Her left leg no longer bothers her. Her right leg will occasionally be sore. She denies any redness to the area. She denies any swelling. Her left wrist discomfort has resolved.    Physical Exam: BP 116/88  Pulse 60  Temp 97.2 F (36.2 C)  Resp 18  Ht 5\' 5"  (1.651 m)  Wt 194 lb (87.998 kg)  BMI 32.28 kg/m2  Well-developed well-nourished obese Caucasian female in no apparent distress  Skin-bilateral thigh incisions are well approximated. There is no cellulitis or fluctuance. There is no induration. There is no evidence of seromas or hematomas  Pathology: Bilateral thigh subcutaneous masses were consistent with lipomas. No evidence of atypia or malignancy  Assessment and Plan: Status post excision of bilateral thigh lipomas.  She appears to be doing well.  Her incisions are completely healed.  I have released her to full activities. I will her on a prn basis

## 2011-01-27 ENCOUNTER — Other Ambulatory Visit: Payer: Self-pay | Admitting: Obstetrics and Gynecology

## 2011-01-27 DIAGNOSIS — Z1231 Encounter for screening mammogram for malignant neoplasm of breast: Secondary | ICD-10-CM

## 2011-02-01 LAB — TSH: TSH: 1.579

## 2011-02-01 LAB — DIFFERENTIAL
Basophils Absolute: 0
Eosinophils Absolute: 0
Eosinophils Relative: 1
Monocytes Absolute: 0.9 — ABNORMAL HIGH

## 2011-02-01 LAB — I-STAT 8, (EC8 V) (CONVERTED LAB)
BUN: 13
Glucose, Bld: 123 — ABNORMAL HIGH
Potassium: 3.2 — ABNORMAL LOW
TCO2: 20
pH, Ven: 7.285

## 2011-02-01 LAB — BASIC METABOLIC PANEL
CO2: 24
Chloride: 114 — ABNORMAL HIGH
Creatinine, Ser: 0.83
GFR calc Af Amer: >60
Potassium: 3.2 — ABNORMAL LOW
Sodium: 146 — ABNORMAL HIGH

## 2011-02-01 LAB — HEPATIC FUNCTION PANEL
ALT: 53 — ABNORMAL HIGH
AST: 39 — ABNORMAL HIGH
Albumin: 3.1 — ABNORMAL LOW
Bilirubin, Direct: <0.1

## 2011-02-01 LAB — POCT I-STAT CREATININE: Operator id: 277751

## 2011-02-01 LAB — CBC
HCT: 35.9 — ABNORMAL LOW
Hemoglobin: 12.4
MCV: 91.7
Platelets: 234
RDW: 12.3

## 2011-02-01 LAB — STOOL CULTURE

## 2011-02-01 LAB — CLOSTRIDIUM DIFFICILE EIA

## 2011-02-01 LAB — GIARDIA/CRYPTOSPORIDIUM SCREEN(EIA)
Cryptosporidium Screen (EIA): NEGATIVE
Giardia Screen - EIA: NEGATIVE

## 2011-02-01 LAB — FECAL LACTOFERRIN, QUANT

## 2011-02-10 ENCOUNTER — Ambulatory Visit: Payer: Medicare Other

## 2011-02-18 ENCOUNTER — Ambulatory Visit: Payer: Medicare Other

## 2011-05-06 DIAGNOSIS — J029 Acute pharyngitis, unspecified: Secondary | ICD-10-CM | POA: Diagnosis not present

## 2011-05-13 ENCOUNTER — Ambulatory Visit
Admission: RE | Admit: 2011-05-13 | Discharge: 2011-05-13 | Disposition: A | Discharge status: Home / Self Care | Payer: Medicare Other | Source: Ambulatory Visit | Attending: Obstetrics and Gynecology | Admitting: Obstetrics and Gynecology

## 2011-05-13 DIAGNOSIS — Z1231 Encounter for screening mammogram for malignant neoplasm of breast: Secondary | ICD-10-CM

## 2011-06-01 DIAGNOSIS — J0100 Acute maxillary sinusitis, unspecified: Secondary | ICD-10-CM | POA: Diagnosis not present

## 2011-06-07 DIAGNOSIS — M279 Disease of jaws, unspecified: Secondary | ICD-10-CM | POA: Diagnosis not present

## 2011-06-07 DIAGNOSIS — H9209 Otalgia, unspecified ear: Secondary | ICD-10-CM | POA: Diagnosis not present

## 2011-06-07 DIAGNOSIS — G43909 Migraine, unspecified, not intractable, without status migrainosus: Secondary | ICD-10-CM | POA: Diagnosis not present

## 2011-06-23 DIAGNOSIS — M542 Cervicalgia: Secondary | ICD-10-CM | POA: Diagnosis not present

## 2011-06-23 DIAGNOSIS — M7530 Calcific tendinitis of unspecified shoulder: Secondary | ICD-10-CM | POA: Diagnosis not present

## 2011-07-12 DIAGNOSIS — J45909 Unspecified asthma, uncomplicated: Secondary | ICD-10-CM | POA: Diagnosis not present

## 2011-07-12 DIAGNOSIS — E559 Vitamin D deficiency, unspecified: Secondary | ICD-10-CM | POA: Diagnosis not present

## 2011-07-12 DIAGNOSIS — R5383 Other fatigue: Secondary | ICD-10-CM | POA: Diagnosis not present

## 2011-07-12 DIAGNOSIS — E785 Hyperlipidemia, unspecified: Secondary | ICD-10-CM | POA: Diagnosis not present

## 2011-07-12 DIAGNOSIS — IMO0001 Reserved for inherently not codable concepts without codable children: Secondary | ICD-10-CM | POA: Diagnosis not present

## 2011-07-12 DIAGNOSIS — E039 Hypothyroidism, unspecified: Secondary | ICD-10-CM | POA: Diagnosis not present

## 2011-07-12 DIAGNOSIS — F329 Major depressive disorder, single episode, unspecified: Secondary | ICD-10-CM | POA: Diagnosis not present

## 2011-07-12 DIAGNOSIS — K589 Irritable bowel syndrome without diarrhea: Secondary | ICD-10-CM | POA: Diagnosis not present

## 2011-07-12 DIAGNOSIS — K219 Gastro-esophageal reflux disease without esophagitis: Secondary | ICD-10-CM | POA: Diagnosis not present

## 2011-07-14 DIAGNOSIS — M7530 Calcific tendinitis of unspecified shoulder: Secondary | ICD-10-CM | POA: Diagnosis not present

## 2011-07-19 DIAGNOSIS — M5030 Other cervical disc degeneration, unspecified cervical region: Secondary | ICD-10-CM | POA: Diagnosis not present

## 2011-07-19 DIAGNOSIS — M542 Cervicalgia: Secondary | ICD-10-CM | POA: Diagnosis not present

## 2011-07-19 DIAGNOSIS — M4712 Other spondylosis with myelopathy, cervical region: Secondary | ICD-10-CM | POA: Diagnosis not present

## 2011-07-23 DIAGNOSIS — M4712 Other spondylosis with myelopathy, cervical region: Secondary | ICD-10-CM | POA: Diagnosis not present

## 2011-08-27 DIAGNOSIS — R6889 Other general symptoms and signs: Secondary | ICD-10-CM | POA: Diagnosis not present

## 2011-08-27 DIAGNOSIS — R51 Headache: Secondary | ICD-10-CM | POA: Diagnosis not present

## 2011-08-27 DIAGNOSIS — R269 Unspecified abnormalities of gait and mobility: Secondary | ICD-10-CM | POA: Diagnosis not present

## 2011-08-27 DIAGNOSIS — M47812 Spondylosis without myelopathy or radiculopathy, cervical region: Secondary | ICD-10-CM | POA: Diagnosis not present

## 2011-09-03 ENCOUNTER — Other Ambulatory Visit: Payer: Self-pay | Admitting: Neurology

## 2011-09-03 DIAGNOSIS — R269 Unspecified abnormalities of gait and mobility: Secondary | ICD-10-CM

## 2011-09-03 DIAGNOSIS — M4712 Other spondylosis with myelopathy, cervical region: Secondary | ICD-10-CM

## 2011-09-09 ENCOUNTER — Ambulatory Visit
Admission: RE | Admit: 2011-09-09 | Discharge: 2011-09-09 | Disposition: A | Discharge status: Home / Self Care | Payer: Medicare Other | Source: Ambulatory Visit | Attending: Neurology | Admitting: Neurology

## 2011-09-09 DIAGNOSIS — M4712 Other spondylosis with myelopathy, cervical region: Secondary | ICD-10-CM

## 2011-09-09 DIAGNOSIS — M47812 Spondylosis without myelopathy or radiculopathy, cervical region: Secondary | ICD-10-CM | POA: Diagnosis not present

## 2011-09-09 DIAGNOSIS — R269 Unspecified abnormalities of gait and mobility: Secondary | ICD-10-CM

## 2011-09-09 DIAGNOSIS — R51 Headache: Secondary | ICD-10-CM | POA: Diagnosis not present

## 2011-09-15 DIAGNOSIS — T148XXA Other injury of unspecified body region, initial encounter: Secondary | ICD-10-CM | POA: Diagnosis not present

## 2011-09-15 DIAGNOSIS — Z23 Encounter for immunization: Secondary | ICD-10-CM | POA: Diagnosis not present

## 2011-09-15 DIAGNOSIS — W010XXA Fall on same level from slipping, tripping and stumbling without subsequent striking against object, initial encounter: Secondary | ICD-10-CM | POA: Diagnosis not present

## 2011-09-15 DIAGNOSIS — S51009A Unspecified open wound of unspecified elbow, initial encounter: Secondary | ICD-10-CM | POA: Diagnosis not present

## 2011-09-15 DIAGNOSIS — S42409A Unspecified fracture of lower end of unspecified humerus, initial encounter for closed fracture: Secondary | ICD-10-CM | POA: Diagnosis not present

## 2011-09-15 DIAGNOSIS — S59919A Unspecified injury of unspecified forearm, initial encounter: Secondary | ICD-10-CM | POA: Diagnosis not present

## 2011-09-15 DIAGNOSIS — E7800 Pure hypercholesterolemia, unspecified: Secondary | ICD-10-CM | POA: Diagnosis not present

## 2011-09-20 DIAGNOSIS — M79609 Pain in unspecified limb: Secondary | ICD-10-CM | POA: Diagnosis not present

## 2011-09-22 DIAGNOSIS — M25539 Pain in unspecified wrist: Secondary | ICD-10-CM | POA: Diagnosis not present

## 2011-09-29 DIAGNOSIS — S5000XA Contusion of unspecified elbow, initial encounter: Secondary | ICD-10-CM | POA: Diagnosis not present

## 2011-12-08 DIAGNOSIS — E785 Hyperlipidemia, unspecified: Secondary | ICD-10-CM | POA: Diagnosis not present

## 2012-01-06 DIAGNOSIS — R197 Diarrhea, unspecified: Secondary | ICD-10-CM | POA: Diagnosis not present

## 2012-01-06 DIAGNOSIS — K219 Gastro-esophageal reflux disease without esophagitis: Secondary | ICD-10-CM | POA: Diagnosis not present

## 2012-01-06 DIAGNOSIS — J45909 Unspecified asthma, uncomplicated: Secondary | ICD-10-CM | POA: Diagnosis not present

## 2012-01-06 DIAGNOSIS — E785 Hyperlipidemia, unspecified: Secondary | ICD-10-CM | POA: Diagnosis not present

## 2012-01-06 DIAGNOSIS — IMO0001 Reserved for inherently not codable concepts without codable children: Secondary | ICD-10-CM | POA: Diagnosis not present

## 2012-01-06 DIAGNOSIS — K589 Irritable bowel syndrome without diarrhea: Secondary | ICD-10-CM | POA: Diagnosis not present

## 2012-01-06 DIAGNOSIS — E559 Vitamin D deficiency, unspecified: Secondary | ICD-10-CM | POA: Diagnosis not present

## 2012-01-06 DIAGNOSIS — E039 Hypothyroidism, unspecified: Secondary | ICD-10-CM | POA: Diagnosis not present

## 2012-01-06 DIAGNOSIS — F329 Major depressive disorder, single episode, unspecified: Secondary | ICD-10-CM | POA: Diagnosis not present

## 2012-02-07 ENCOUNTER — Encounter: Payer: Self-pay | Admitting: Gastroenterology

## 2012-02-10 ENCOUNTER — Encounter: Payer: Self-pay | Admitting: Gastroenterology

## 2012-02-10 ENCOUNTER — Ambulatory Visit (INDEPENDENT_AMBULATORY_CARE_PROVIDER_SITE_OTHER): Payer: Medicare Other | Admitting: Gastroenterology

## 2012-02-10 ENCOUNTER — Telehealth: Payer: Self-pay | Admitting: Gastroenterology

## 2012-02-10 VITALS — BP 106/80 | HR 64 | Ht 64.75 in | Wt 192.2 lb

## 2012-02-10 DIAGNOSIS — Z8371 Family history of colonic polyps: Secondary | ICD-10-CM | POA: Diagnosis not present

## 2012-02-10 DIAGNOSIS — R197 Diarrhea, unspecified: Secondary | ICD-10-CM

## 2012-02-10 MED ORDER — PEG-KCL-NACL-NASULF-NA ASC-C 100 G PO SOLR: 1.0000 | Freq: Once | ORAL | Status: DC

## 2012-02-10 MED ORDER — HYOSCYAMINE SULFATE 0.125 MG SL SUBL: SUBLINGUAL_TABLET | SUBLINGUAL | Status: DC

## 2012-02-10 NOTE — Telephone Encounter (Signed)
Patient states that Levsin is too expensive even with the generic. Can we switch her to Bentyl? It is covered by her insurance.

## 2012-02-10 NOTE — Telephone Encounter (Signed)
OK to change to dicyclomine 10 mg po ac, 1 month supply, 6 refills

## 2012-02-10 NOTE — Patient Instructions (Addendum)
You have been scheduled for a colonoscopy with propofol. Please follow written instructions given to you at your visit today.  Please pick up your prep kit at the pharmacy within the next 1-3 days. If you use inhalers (even only as needed), please bring them with you on the day of your procedure.   Your physician has requested that you go to the basement for the following lab work before leaving today: Stool studies.  We have sent the following medications to your pharmacy for you to pick up at your convenience: Levsin.  cc: Johny Blamer, MD

## 2012-02-10 NOTE — Telephone Encounter (Signed)
Left a message for patient to return my call. 

## 2012-02-10 NOTE — Progress Notes (Signed)
History of Present Illness: This is a 56 year old female who relates a 6 week history of urgent postprandial diarrhea associated with several episodes of fecal incontinence. She describes watery, nonbloody diarrhea. This is occasionally associated with crampy lower bowel pain. She relates no diet or medication changes recently. She denies recent use of any antibiotics in the past few months. Her prior bowel habit pattern has been stable for many years and consists of a bowel movement every day or every other day. She has family history of colon polyps and underwent colonoscopy in October 1999 which was normal. She was recommended to have a colonoscopy in 5 years however she did not return for followup. She also underwent upper endoscopy in October 1999 which showed mild esophagitis mild gastritis and a small hiatal hernia. A recent CBC performed in Dr. Johnathan Hausen office was unremarkable. Denies weight loss, constipation, change in stool caliber, melena, hematochezia, nausea, vomiting, dysphagia, reflux symptoms, chest pain.  Review of Systems: Pertinent positive and negative review of systems were noted in the above HPI section. All other review of systems were otherwise negative.  Current Medications, Allergies, Past Medical History, Past Surgical History, Family History and Social History were reviewed in [employer] record.  Physical Exam: General: Well developed , well nourished, no acute distress Head: Normocephalic and atraumatic Eyes:  sclerae anicteric, EOMI Ears: Normal auditory acuity Mouth: No deformity or lesions Neck: Supple, no masses or thyromegaly Lungs: Clear throughout to auscultation Heart: Regular rate and rhythm; no murmurs, rubs or bruits Abdomen: Soft, non tender and non distended. No masses, hepatosplenomegaly or hernias noted. Normal Bowel sounds Rectal: no lesions, normal sphincter tone, heme - stool Musculoskeletal: Symmetrical with no gross  deformities  Skin: No lesions on visible extremities Pulses:  Normal pulses noted Extremities: No clubbing, cyanosis, edema or deformities noted Neurological: Alert oriented x 4, grossly nonfocal Cervical Nodes:  No significant cervical adenopathy Inguinal Nodes: No significant inguinal adenopathy Psychological:  Alert and cooperative. Normal mood and affect  Assessment and Recommendations:  1. Diarrhea with lower abdominal pain and occasional fecal incontinence. R/O colitis, IBS, infectious process. Obtain stool studies. Schedule colonoscopy. The risks, benefits, and alternatives to colonoscopy with possible biopsy and possible polypectomy were discussed with the patient and they consent to proceed. Trial of hyoscyamine 1-2 before meals. Imodium twice a day when necessary.  2. Strong family history of colon polyps in her mother, father and maternal grandmother. Colonoscopy as above.

## 2012-02-11 MED ORDER — DICYCLOMINE HCL 10 MG PO CAPS: 10.0000 mg | ORAL_CAPSULE | Freq: Three times a day (TID) | ORAL | Status: DC

## 2012-02-11 NOTE — Telephone Encounter (Signed)
Prescription sent to patient's pharmacy. Told patient to call back if the medication is too expensive or does not help. Pt agreed.

## 2012-02-14 ENCOUNTER — Encounter: Payer: Self-pay | Admitting: Gastroenterology

## 2012-02-15 ENCOUNTER — Encounter: Payer: Self-pay | Admitting: Gastroenterology

## 2012-02-17 ENCOUNTER — Other Ambulatory Visit: Payer: Medicare Other

## 2012-02-17 DIAGNOSIS — Z8371 Family history of colonic polyps: Secondary | ICD-10-CM | POA: Diagnosis not present

## 2012-02-17 DIAGNOSIS — R197 Diarrhea, unspecified: Secondary | ICD-10-CM | POA: Diagnosis not present

## 2012-02-17 DIAGNOSIS — Z83719 Family history of colon polyps, unspecified: Secondary | ICD-10-CM | POA: Diagnosis not present

## 2012-02-18 LAB — FECAL FAT QUALITATIVE
Free Fatty Acids: NORMAL
NEUTRAL FAT: NORMAL

## 2012-02-18 LAB — FECAL LACTOFERRIN, QUANT: Lactoferrin: NEGATIVE

## 2012-02-18 LAB — CLOSTRIDIUM DIFFICILE BY PCR: Toxigenic C. Difficile by PCR: NOT DETECTED

## 2012-02-21 LAB — STOOL CULTURE

## 2012-02-28 ENCOUNTER — Telehealth: Payer: Self-pay | Admitting: Gastroenterology

## 2012-02-28 NOTE — Telephone Encounter (Signed)
All questions answered.  She will call back for any additional questions about her diet for colonoscopy

## 2012-03-07 ENCOUNTER — Encounter: Payer: Self-pay | Admitting: Gastroenterology

## 2012-03-07 ENCOUNTER — Ambulatory Visit (AMBULATORY_SURGERY_CENTER): Payer: Medicare Other | Admitting: Gastroenterology

## 2012-03-07 VITALS — BP 141/65 | HR 61 | Temp 98.0°F | Resp 21 | Ht 64.0 in | Wt 192.0 lb

## 2012-03-07 DIAGNOSIS — D126 Benign neoplasm of colon, unspecified: Secondary | ICD-10-CM

## 2012-03-07 DIAGNOSIS — Z8371 Family history of colonic polyps: Secondary | ICD-10-CM | POA: Diagnosis not present

## 2012-03-07 DIAGNOSIS — R197 Diarrhea, unspecified: Secondary | ICD-10-CM

## 2012-03-07 DIAGNOSIS — Z1211 Encounter for screening for malignant neoplasm of colon: Secondary | ICD-10-CM | POA: Diagnosis not present

## 2012-03-07 MED ORDER — SODIUM CHLORIDE 0.9 % IV SOLN: 500.0000 mL | INTRAVENOUS | Status: DC

## 2012-03-07 NOTE — Progress Notes (Signed)
No complaints noted in the recovery room. Maw   

## 2012-03-07 NOTE — Op Note (Signed)
[facility] [address] [place], [address]   COLONOSCOPY PROCEDURE REPORT  PATIENT: Glenda, Evans  MR#: [id number] BIRTHDATE: [birth year] , 55  yrs. old GENDER: Female ENDOSCOPIST: Meryl Dare, MD, [facility] PROCEDURE DATE:  03/07/2012 PROCEDURE:   Colonoscopy with biopsy ASA CLASS:   Class II INDICATIONS:unexplained diarrhea, lower abdominal pain and patient's family history of colon polyps. MEDICATIONS: MAC sedation, administered by CRNA and propofol (Diprivan) 300mg  IV DESCRIPTION OF PROCEDURE:   After the risks benefits and alternatives of the procedure were thoroughly explained, informed consent was obtained.  A digital rectal exam revealed no abnormalities of the rectum.   The LB CF-[id number]  endoscope was introduced through the anus and advanced to the terminal ileum which was intubated for a short distance. No adverse events experienced.   The quality of the prep was adequate, using MoviPrep The instrument was then slowly withdrawn as the colon was fully examined.  COLON FINDINGS: A normal appearing cecum, ileocecal valve, and appendiceal orifice were identified. The ascending, hepatic flexure, transverse, splenic flexure, descending, sigmoid colon and rectum appeared unremarkable. Random biopsies taken throughout the colon. No polyps or cancers were seen. The mucosa appeared normal in the terminal ileum.  Retroflexed views revealed no abnormalities. The time to cecum=3 minutes 57 seconds.  Withdrawal time=8 minutes 32 seconds.  The scope was withdrawn and the procedure completed.  COMPLICATIONS: There were no complications.  ENDOSCOPIC IMPRESSION: 1.   Normal colon 2.   Normal mucosa in the terminal ileum  RECOMMENDATIONS: 1.  Await pathology results 2.  Repeat Colonoscopy in 5 years. 3.  Continue treatment for IBS with Levsin, probiotics and diet  eSigned:  Meryl Dare, MD, [facility] 03/07/2012 3:44 PM   cc: Johny Blamer  MD

## 2012-03-07 NOTE — Patient Instructions (Addendum)
Handout was given to your care partner on IBS.  Per Dr. Russella Dar continue levsin, pro-botic and diet you were previously given.  You may also resume your other current medications today.  Please call if any questions or concerns.    YOU HAD AN ENDOSCOPIC PROCEDURE TODAY AT THE [facility]: Refer to the procedure report that was given to you for any specific questions about what was found during the examination.  If the procedure report does not answer your questions, please call your gastroenterologist to clarify.  If you requested that your care partner not be given the details of your procedure findings, then the procedure report has been included in a sealed envelope for you to review at your convenience later.  YOU SHOULD EXPECT: Some feelings of bloating in the abdomen. Passage of more gas than usual.  Walking can help get rid of the air that was put into your GI tract during the procedure and reduce the bloating. If you had a lower endoscopy (such as a colonoscopy or flexible sigmoidoscopy) you may notice spotting of blood in your stool or on the toilet paper. If you underwent a bowel prep for your procedure, then you may not have a normal bowel movement for a few days.  DIET: Your first meal following the procedure should be a light meal and then it is ok to progress to your normal diet.  A half-sandwich or bowl of soup is an example of a good first meal.  Heavy or fried foods are harder to digest and may make you feel nauseous or bloated.  Likewise meals heavy in dairy and vegetables can cause extra gas to form and this can also increase the bloating.  Drink plenty of fluids but you should avoid alcoholic beverages for 24 hours.  ACTIVITY: Your care partner should take you home directly after the procedure.  You should plan to take it easy, moving slowly for the rest of the day.  You can resume normal activity the day after the procedure however you should NOT DRIVE or use heavy machinery  for 24 hours (because of the sedation medicines used during the test).    SYMPTOMS TO REPORT IMMEDIATELY: A gastroenterologist can be reached at any hour.  During normal business hours, 8:30 AM to 5:00 PM Monday through Friday, call [phone].  After hours and on weekends, please call the GI answering service at [phone] who will take a message and have the physician on call contact you.   Following lower endoscopy (colonoscopy or flexible sigmoidoscopy):  Excessive amounts of blood in the stool  Significant tenderness or worsening of abdominal pains  Swelling of the abdomen that is new, acute  Fever of 100F or higher    Black, tarry-looking stools  FOLLOW UP: If any biopsies were taken you will be contacted by phone or by letter within the next 1-3 weeks.  Call your gastroenterologist if you have not heard about the biopsies in 3 weeks.  Our staff will call the home number listed on your records the next business day following your procedure to check on you and address any questions or concerns that you may have at that time regarding the information given to you following your procedure. This is a courtesy call and so if there is no answer at the home number and we have not heard from you through the emergency physician on call, we will assume that you have returned to your regular daily activities without incident.  SIGNATURES/CONFIDENTIALITY: You and/or your care partner have signed paperwork which will be entered into your electronic medical record.  These signatures attest to the fact that that the information above on your After Visit Summary has been reviewed and is understood.  Full responsibility of the confidentiality of this discharge information lies with you and/or your care-partner.

## 2012-03-07 NOTE — Progress Notes (Signed)
[id number] a/ox3 pleased report to [facility]

## 2012-03-07 NOTE — Progress Notes (Signed)
Patient did not experience any of the following events: a burn prior to discharge; a fall within the facility; wrong site/side/patient/procedure/implant event; or a hospital transfer or hospital admission upon discharge from the facility. (G8907) Patient did not have preoperative order for IV antibiotic SSI prophylaxis. (G8918)  

## 2012-03-08 ENCOUNTER — Telehealth: Payer: Self-pay | Admitting: *Deleted

## 2012-03-08 NOTE — Telephone Encounter (Signed)
  Follow up Call-  Call back number 03/07/2012  Post procedure Call Back phone  # [phone]  Permission to leave phone message Yes     Patient questions:  Do you have a fever, pain , or abdominal swelling? no Pain Score  0 *  Have you tolerated food without any problems? yes  Have you been able to return to your normal activities? yes  Do you have any questions about your discharge instructions: Diet   no Medications  no Follow up visit  no  Do you have questions or concerns about your Care? no  Actions: * If pain score is 4 or above: No action needed, pain <4.

## 2012-03-17 ENCOUNTER — Encounter: Payer: Self-pay | Admitting: Gastroenterology

## 2012-04-28 ENCOUNTER — Other Ambulatory Visit: Payer: Self-pay | Admitting: Obstetrics and Gynecology

## 2012-04-28 DIAGNOSIS — Z1231 Encounter for screening mammogram for malignant neoplasm of breast: Secondary | ICD-10-CM

## 2012-05-19 DIAGNOSIS — J029 Acute pharyngitis, unspecified: Secondary | ICD-10-CM | POA: Diagnosis not present

## 2012-05-31 ENCOUNTER — Ambulatory Visit
Admission: RE | Admit: 2012-05-31 | Discharge: 2012-05-31 | Disposition: A | Discharge status: Home / Self Care | Payer: Medicare Other | Source: Ambulatory Visit | Attending: Obstetrics and Gynecology | Admitting: Obstetrics and Gynecology

## 2012-05-31 DIAGNOSIS — Z1231 Encounter for screening mammogram for malignant neoplasm of breast: Secondary | ICD-10-CM

## 2012-07-04 DIAGNOSIS — E039 Hypothyroidism, unspecified: Secondary | ICD-10-CM | POA: Diagnosis not present

## 2012-07-04 DIAGNOSIS — IMO0001 Reserved for inherently not codable concepts without codable children: Secondary | ICD-10-CM | POA: Diagnosis not present

## 2012-07-04 DIAGNOSIS — E785 Hyperlipidemia, unspecified: Secondary | ICD-10-CM | POA: Diagnosis not present

## 2012-09-10 DIAGNOSIS — J019 Acute sinusitis, unspecified: Secondary | ICD-10-CM | POA: Diagnosis not present

## 2012-11-22 DIAGNOSIS — Z01419 Encounter for gynecological examination (general) (routine) without abnormal findings: Secondary | ICD-10-CM | POA: Diagnosis not present

## 2012-11-22 DIAGNOSIS — N898 Other specified noninflammatory disorders of vagina: Secondary | ICD-10-CM | POA: Diagnosis not present

## 2012-11-22 DIAGNOSIS — K649 Unspecified hemorrhoids: Secondary | ICD-10-CM | POA: Diagnosis not present

## 2012-12-05 DIAGNOSIS — IMO0001 Reserved for inherently not codable concepts without codable children: Secondary | ICD-10-CM | POA: Diagnosis not present

## 2012-12-05 DIAGNOSIS — E039 Hypothyroidism, unspecified: Secondary | ICD-10-CM | POA: Diagnosis not present

## 2012-12-05 DIAGNOSIS — E785 Hyperlipidemia, unspecified: Secondary | ICD-10-CM | POA: Diagnosis not present

## 2012-12-05 DIAGNOSIS — K219 Gastro-esophageal reflux disease without esophagitis: Secondary | ICD-10-CM | POA: Diagnosis not present

## 2012-12-05 DIAGNOSIS — E559 Vitamin D deficiency, unspecified: Secondary | ICD-10-CM | POA: Diagnosis not present

## 2012-12-05 DIAGNOSIS — I059 Rheumatic mitral valve disease, unspecified: Secondary | ICD-10-CM | POA: Diagnosis not present

## 2012-12-05 DIAGNOSIS — F329 Major depressive disorder, single episode, unspecified: Secondary | ICD-10-CM | POA: Diagnosis not present

## 2013-01-03 DIAGNOSIS — M545 Low back pain: Secondary | ICD-10-CM | POA: Diagnosis not present

## 2013-01-13 DIAGNOSIS — M47817 Spondylosis without myelopathy or radiculopathy, lumbosacral region: Secondary | ICD-10-CM | POA: Diagnosis not present

## 2013-01-22 DIAGNOSIS — M76899 Other specified enthesopathies of unspecified lower limb, excluding foot: Secondary | ICD-10-CM | POA: Diagnosis not present

## 2013-01-22 DIAGNOSIS — M545 Low back pain: Secondary | ICD-10-CM | POA: Diagnosis not present

## 2013-01-23 DIAGNOSIS — IMO0002 Reserved for concepts with insufficient information to code with codable children: Secondary | ICD-10-CM | POA: Diagnosis not present

## 2013-03-02 DIAGNOSIS — I10 Essential (primary) hypertension: Secondary | ICD-10-CM | POA: Diagnosis not present

## 2013-03-24 DIAGNOSIS — M940 Chondrocostal junction syndrome [Tietze]: Secondary | ICD-10-CM | POA: Diagnosis not present

## 2013-03-24 DIAGNOSIS — R079 Chest pain, unspecified: Secondary | ICD-10-CM | POA: Diagnosis not present

## 2013-03-29 DIAGNOSIS — E039 Hypothyroidism, unspecified: Secondary | ICD-10-CM | POA: Diagnosis not present

## 2013-03-29 DIAGNOSIS — IMO0001 Reserved for inherently not codable concepts without codable children: Secondary | ICD-10-CM | POA: Diagnosis not present

## 2013-03-29 DIAGNOSIS — I10 Essential (primary) hypertension: Secondary | ICD-10-CM | POA: Diagnosis not present

## 2013-03-29 DIAGNOSIS — E559 Vitamin D deficiency, unspecified: Secondary | ICD-10-CM | POA: Diagnosis not present

## 2013-03-29 DIAGNOSIS — I059 Rheumatic mitral valve disease, unspecified: Secondary | ICD-10-CM | POA: Diagnosis not present

## 2013-03-29 DIAGNOSIS — K219 Gastro-esophageal reflux disease without esophagitis: Secondary | ICD-10-CM | POA: Diagnosis not present

## 2013-03-29 DIAGNOSIS — E785 Hyperlipidemia, unspecified: Secondary | ICD-10-CM | POA: Diagnosis not present

## 2013-03-29 DIAGNOSIS — J45909 Unspecified asthma, uncomplicated: Secondary | ICD-10-CM | POA: Diagnosis not present

## 2013-04-23 ENCOUNTER — Other Ambulatory Visit: Payer: Self-pay

## 2013-04-23 DIAGNOSIS — Z1231 Encounter for screening mammogram for malignant neoplasm of breast: Secondary | ICD-10-CM

## 2013-05-12 DIAGNOSIS — J019 Acute sinusitis, unspecified: Secondary | ICD-10-CM | POA: Diagnosis not present

## 2013-05-21 DIAGNOSIS — H11159 Pinguecula, unspecified eye: Secondary | ICD-10-CM | POA: Diagnosis not present

## 2013-05-21 DIAGNOSIS — H2589 Other age-related cataract: Secondary | ICD-10-CM | POA: Diagnosis not present

## 2013-05-24 DIAGNOSIS — J019 Acute sinusitis, unspecified: Secondary | ICD-10-CM | POA: Diagnosis not present

## 2013-05-24 DIAGNOSIS — R61 Generalized hyperhidrosis: Secondary | ICD-10-CM | POA: Diagnosis not present

## 2013-06-01 ENCOUNTER — Ambulatory Visit
Admission: RE | Admit: 2013-06-01 | Discharge: 2013-06-01 | Disposition: A | Discharge status: Home / Self Care | Payer: Medicare Other | Source: Ambulatory Visit

## 2013-06-01 DIAGNOSIS — Z1231 Encounter for screening mammogram for malignant neoplasm of breast: Secondary | ICD-10-CM | POA: Diagnosis not present

## 2013-06-28 ENCOUNTER — Other Ambulatory Visit: Payer: Self-pay | Admitting: Family Medicine

## 2013-06-28 DIAGNOSIS — S060XAA Concussion with loss of consciousness status unknown, initial encounter: Secondary | ICD-10-CM | POA: Diagnosis not present

## 2013-06-28 DIAGNOSIS — S060X9A Concussion with loss of consciousness of unspecified duration, initial encounter: Secondary | ICD-10-CM

## 2013-06-28 DIAGNOSIS — R2689 Other abnormalities of gait and mobility: Secondary | ICD-10-CM

## 2013-06-28 DIAGNOSIS — R61 Generalized hyperhidrosis: Secondary | ICD-10-CM | POA: Diagnosis not present

## 2013-06-29 ENCOUNTER — Ambulatory Visit
Admission: RE | Admit: 2013-06-29 | Discharge: 2013-06-29 | Disposition: A | Discharge status: Home / Self Care | Payer: Medicare Other | Source: Ambulatory Visit | Attending: Family Medicine | Admitting: Family Medicine

## 2013-06-29 DIAGNOSIS — R2689 Other abnormalities of gait and mobility: Secondary | ICD-10-CM

## 2013-06-29 DIAGNOSIS — S060X9A Concussion with loss of consciousness of unspecified duration, initial encounter: Secondary | ICD-10-CM

## 2013-06-29 DIAGNOSIS — S060XAA Concussion with loss of consciousness status unknown, initial encounter: Secondary | ICD-10-CM | POA: Diagnosis not present

## 2013-07-03 ENCOUNTER — Encounter: Payer: Self-pay | Admitting: Neurology

## 2013-07-03 ENCOUNTER — Telehealth: Payer: Self-pay | Admitting: *Deleted

## 2013-07-03 ENCOUNTER — Ambulatory Visit (INDEPENDENT_AMBULATORY_CARE_PROVIDER_SITE_OTHER): Payer: Medicare Other | Admitting: Neurology

## 2013-07-03 VITALS — BP 110/60 | HR 74 | Temp 97.7°F | Ht 65.0 in | Wt 208.6 lb

## 2013-07-03 DIAGNOSIS — G589 Mononeuropathy, unspecified: Secondary | ICD-10-CM | POA: Diagnosis not present

## 2013-07-03 DIAGNOSIS — R269 Unspecified abnormalities of gait and mobility: Secondary | ICD-10-CM

## 2013-07-03 DIAGNOSIS — G629 Polyneuropathy, unspecified: Secondary | ICD-10-CM

## 2013-07-03 DIAGNOSIS — R209 Unspecified disturbances of skin sensation: Secondary | ICD-10-CM | POA: Diagnosis not present

## 2013-07-03 DIAGNOSIS — F0781 Postconcussional syndrome: Secondary | ICD-10-CM | POA: Diagnosis not present

## 2013-07-03 DIAGNOSIS — R2681 Unsteadiness on feet: Secondary | ICD-10-CM | POA: Insufficient documentation

## 2013-07-03 DIAGNOSIS — G609 Hereditary and idiopathic neuropathy, unspecified: Secondary | ICD-10-CM

## 2013-07-03 NOTE — Telephone Encounter (Signed)
Message copied by Ella Jubilee on Tue Jul 03, 2013  2:53 PM ------      Message from: Cameron Sprang      Created: Tue Jul 03, 2013  1:15 PM      Regarding: labs       I forgot to order labs, can we pls order B12, ESR, and SPEP for neuropathy on Glenda Evans?  Thanks! ------

## 2013-07-03 NOTE — Patient Instructions (Addendum)
1. Important to get physical and cognitive rest 2. Use cane for support 3. Follow-up in 6 weeks

## 2013-07-03 NOTE — Addendum Note (Signed)
Addended by: Ella Jubilee on: 07/03/2013 02:11 PM   Modules accepted: Orders

## 2013-07-03 NOTE — Telephone Encounter (Signed)
Labs were ordered and patient was notified. She states she will be in on tomorrow to have them drawn
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
## 2013-07-04 LAB — VITAMIN B12: Vitamin B-12: 430 pg/mL (ref 211–911)

## 2013-07-04 LAB — SEDIMENTATION RATE: SED RATE: 1 mm/h (ref 0–22)

## 2013-07-06 LAB — PROTEIN ELECTROPHORESIS, SERUM
Albumin ELP: 64.3 % (ref 55.8–66.1)
Alpha-1-Globulin: 4.4 % (ref 2.9–4.9)
Alpha-2-Globulin: 8.5 % (ref 7.1–11.8)
Beta 2: 3.7 % (ref 3.2–6.5)
Beta Globulin: 7.6 % — ABNORMAL HIGH (ref 4.7–7.2)
Gamma Globulin: 11.5 % (ref 11.1–18.8)
TOTAL PROTEIN, SERUM ELECTROPHOR: 6.6 g/dL (ref 6.0–8.3)

## 2013-08-14 ENCOUNTER — Ambulatory Visit: Payer: Medicare Other | Admitting: Neurology

## 2013-08-14 DIAGNOSIS — J209 Acute bronchitis, unspecified: Secondary | ICD-10-CM | POA: Diagnosis not present

## 2013-08-14 DIAGNOSIS — J069 Acute upper respiratory infection, unspecified: Secondary | ICD-10-CM | POA: Diagnosis not present

## 2013-08-24 ENCOUNTER — Ambulatory Visit: Payer: Medicare Other | Admitting: Neurology

## 2013-10-12 DIAGNOSIS — K5289 Other specified noninfective gastroenteritis and colitis: Secondary | ICD-10-CM | POA: Diagnosis not present

## 2013-10-12 DIAGNOSIS — E7800 Pure hypercholesterolemia, unspecified: Secondary | ICD-10-CM | POA: Diagnosis not present

## 2013-10-12 DIAGNOSIS — Z79899 Other long term (current) drug therapy: Secondary | ICD-10-CM | POA: Diagnosis not present

## 2013-10-12 DIAGNOSIS — R5381 Other malaise: Secondary | ICD-10-CM | POA: Diagnosis not present

## 2013-10-12 DIAGNOSIS — R109 Unspecified abdominal pain: Secondary | ICD-10-CM | POA: Diagnosis not present

## 2013-10-12 DIAGNOSIS — K7689 Other specified diseases of liver: Secondary | ICD-10-CM | POA: Diagnosis not present

## 2013-10-12 DIAGNOSIS — I10 Essential (primary) hypertension: Secondary | ICD-10-CM | POA: Diagnosis not present

## 2013-10-18 DIAGNOSIS — F3289 Other specified depressive episodes: Secondary | ICD-10-CM | POA: Diagnosis not present

## 2013-10-18 DIAGNOSIS — K219 Gastro-esophageal reflux disease without esophagitis: Secondary | ICD-10-CM | POA: Diagnosis not present

## 2013-10-18 DIAGNOSIS — F329 Major depressive disorder, single episode, unspecified: Secondary | ICD-10-CM | POA: Diagnosis not present

## 2013-10-18 DIAGNOSIS — I10 Essential (primary) hypertension: Secondary | ICD-10-CM | POA: Diagnosis not present

## 2013-10-18 DIAGNOSIS — E039 Hypothyroidism, unspecified: Secondary | ICD-10-CM | POA: Diagnosis not present

## 2013-10-18 DIAGNOSIS — IMO0001 Reserved for inherently not codable concepts without codable children: Secondary | ICD-10-CM | POA: Diagnosis not present

## 2013-10-18 DIAGNOSIS — R197 Diarrhea, unspecified: Secondary | ICD-10-CM | POA: Diagnosis not present

## 2013-10-18 DIAGNOSIS — E559 Vitamin D deficiency, unspecified: Secondary | ICD-10-CM | POA: Diagnosis not present

## 2013-10-18 DIAGNOSIS — E785 Hyperlipidemia, unspecified: Secondary | ICD-10-CM | POA: Diagnosis not present

## 2013-11-02 DIAGNOSIS — E876 Hypokalemia: Secondary | ICD-10-CM | POA: Diagnosis not present

## 2013-11-21 DIAGNOSIS — K219 Gastro-esophageal reflux disease without esophagitis: Secondary | ICD-10-CM | POA: Diagnosis not present

## 2013-11-21 DIAGNOSIS — K591 Functional diarrhea: Secondary | ICD-10-CM | POA: Diagnosis not present

## 2013-11-21 DIAGNOSIS — R1032 Left lower quadrant pain: Secondary | ICD-10-CM | POA: Diagnosis not present

## 2013-11-23 DIAGNOSIS — J0100 Acute maxillary sinusitis, unspecified: Secondary | ICD-10-CM | POA: Diagnosis not present

## 2013-12-06 DIAGNOSIS — J209 Acute bronchitis, unspecified: Secondary | ICD-10-CM | POA: Diagnosis not present

## 2013-12-27 ENCOUNTER — Ambulatory Visit
Admission: RE | Admit: 2013-12-27 | Discharge: 2013-12-27 | Disposition: A | Discharge status: Home / Self Care | Payer: Medicare Other | Source: Ambulatory Visit | Attending: Family Medicine | Admitting: Family Medicine

## 2013-12-27 ENCOUNTER — Other Ambulatory Visit: Payer: Self-pay | Admitting: Family Medicine

## 2013-12-27 DIAGNOSIS — M79671 Pain in right foot: Secondary | ICD-10-CM

## 2013-12-27 DIAGNOSIS — R209 Unspecified disturbances of skin sensation: Secondary | ICD-10-CM | POA: Diagnosis not present

## 2013-12-27 DIAGNOSIS — M79609 Pain in unspecified limb: Secondary | ICD-10-CM | POA: Diagnosis not present

## 2013-12-27 DIAGNOSIS — N289 Disorder of kidney and ureter, unspecified: Secondary | ICD-10-CM | POA: Diagnosis not present

## 2014-03-04 DIAGNOSIS — S0990XA Unspecified injury of head, initial encounter: Secondary | ICD-10-CM | POA: Diagnosis not present

## 2014-03-04 DIAGNOSIS — S199XXA Unspecified injury of neck, initial encounter: Secondary | ICD-10-CM | POA: Diagnosis not present

## 2014-03-04 DIAGNOSIS — M542 Cervicalgia: Secondary | ICD-10-CM | POA: Diagnosis not present

## 2014-03-04 DIAGNOSIS — I10 Essential (primary) hypertension: Secondary | ICD-10-CM | POA: Diagnosis not present

## 2014-03-04 DIAGNOSIS — F0781 Postconcussional syndrome: Secondary | ICD-10-CM | POA: Diagnosis not present

## 2014-03-04 DIAGNOSIS — W0110XA Fall on same level from slipping, tripping and stumbling with subsequent striking against unspecified object, initial encounter: Secondary | ICD-10-CM | POA: Diagnosis not present

## 2014-03-04 DIAGNOSIS — R51 Headache: Secondary | ICD-10-CM | POA: Diagnosis not present

## 2014-04-16 DIAGNOSIS — R233 Spontaneous ecchymoses: Secondary | ICD-10-CM | POA: Diagnosis not present

## 2014-04-16 DIAGNOSIS — F419 Anxiety disorder, unspecified: Secondary | ICD-10-CM | POA: Diagnosis not present

## 2014-04-16 DIAGNOSIS — E039 Hypothyroidism, unspecified: Secondary | ICD-10-CM | POA: Diagnosis not present

## 2014-04-16 DIAGNOSIS — E780 Pure hypercholesterolemia: Secondary | ICD-10-CM | POA: Diagnosis not present

## 2014-04-16 DIAGNOSIS — E876 Hypokalemia: Secondary | ICD-10-CM | POA: Diagnosis not present

## 2014-04-16 DIAGNOSIS — F329 Major depressive disorder, single episode, unspecified: Secondary | ICD-10-CM | POA: Diagnosis not present

## 2014-04-16 DIAGNOSIS — R5383 Other fatigue: Secondary | ICD-10-CM | POA: Diagnosis not present

## 2014-04-16 DIAGNOSIS — G4700 Insomnia, unspecified: Secondary | ICD-10-CM | POA: Diagnosis not present

## 2014-06-07 DIAGNOSIS — H2589 Other age-related cataract: Secondary | ICD-10-CM | POA: Diagnosis not present

## 2014-06-07 DIAGNOSIS — H52221 Regular astigmatism, right eye: Secondary | ICD-10-CM | POA: Diagnosis not present

## 2014-06-07 DIAGNOSIS — H524 Presbyopia: Secondary | ICD-10-CM | POA: Diagnosis not present

## 2014-06-07 DIAGNOSIS — H11153 Pinguecula, bilateral: Secondary | ICD-10-CM | POA: Diagnosis not present

## 2014-06-07 DIAGNOSIS — H5213 Myopia, bilateral: Secondary | ICD-10-CM | POA: Diagnosis not present

## 2014-06-10 DIAGNOSIS — K1370 Unspecified lesions of oral mucosa: Secondary | ICD-10-CM | POA: Diagnosis not present

## 2014-06-30 ENCOUNTER — Observation Stay (HOSPITAL_COMMUNITY)
Admission: EM | Admit: 2014-06-30 | Discharge: 2014-07-01 | Disposition: A | Discharge status: Home / Self Care | Payer: Medicare Other | Attending: Internal Medicine | Admitting: Internal Medicine

## 2014-06-30 ENCOUNTER — Encounter (HOSPITAL_COMMUNITY): Payer: Self-pay | Admitting: Emergency Medicine

## 2014-06-30 DIAGNOSIS — I10 Essential (primary) hypertension: Secondary | ICD-10-CM | POA: Diagnosis present

## 2014-06-30 DIAGNOSIS — K219 Gastro-esophageal reflux disease without esophagitis: Secondary | ICD-10-CM | POA: Insufficient documentation

## 2014-06-30 DIAGNOSIS — Z90710 Acquired absence of both cervix and uterus: Secondary | ICD-10-CM | POA: Insufficient documentation

## 2014-06-30 DIAGNOSIS — K589 Irritable bowel syndrome without diarrhea: Secondary | ICD-10-CM | POA: Diagnosis not present

## 2014-06-30 DIAGNOSIS — I341 Nonrheumatic mitral (valve) prolapse: Secondary | ICD-10-CM

## 2014-06-30 DIAGNOSIS — Z881 Allergy status to other antibiotic agents status: Secondary | ICD-10-CM | POA: Diagnosis not present

## 2014-06-30 DIAGNOSIS — M797 Fibromyalgia: Secondary | ICD-10-CM | POA: Diagnosis not present

## 2014-06-30 DIAGNOSIS — M545 Low back pain, unspecified: Secondary | ICD-10-CM | POA: Diagnosis present

## 2014-06-30 DIAGNOSIS — J45909 Unspecified asthma, uncomplicated: Secondary | ICD-10-CM | POA: Insufficient documentation

## 2014-06-30 DIAGNOSIS — R55 Syncope and collapse: Principal | ICD-10-CM | POA: Diagnosis present

## 2014-06-30 DIAGNOSIS — Z9049 Acquired absence of other specified parts of digestive tract: Secondary | ICD-10-CM | POA: Insufficient documentation

## 2014-06-30 DIAGNOSIS — F329 Major depressive disorder, single episode, unspecified: Secondary | ICD-10-CM | POA: Diagnosis not present

## 2014-06-30 DIAGNOSIS — F419 Anxiety disorder, unspecified: Secondary | ICD-10-CM | POA: Insufficient documentation

## 2014-06-30 DIAGNOSIS — Z888 Allergy status to other drugs, medicaments and biological substances status: Secondary | ICD-10-CM | POA: Diagnosis not present

## 2014-06-30 DIAGNOSIS — E039 Hypothyroidism, unspecified: Secondary | ICD-10-CM | POA: Diagnosis not present

## 2014-06-30 DIAGNOSIS — G43909 Migraine, unspecified, not intractable, without status migrainosus: Secondary | ICD-10-CM | POA: Diagnosis not present

## 2014-06-30 DIAGNOSIS — G8929 Other chronic pain: Secondary | ICD-10-CM | POA: Diagnosis not present

## 2014-06-30 DIAGNOSIS — Z882 Allergy status to sulfonamides status: Secondary | ICD-10-CM | POA: Insufficient documentation

## 2014-06-30 DIAGNOSIS — R339 Retention of urine, unspecified: Secondary | ICD-10-CM | POA: Diagnosis not present

## 2014-06-30 DIAGNOSIS — J4520 Mild intermittent asthma, uncomplicated: Secondary | ICD-10-CM | POA: Diagnosis not present

## 2014-06-30 DIAGNOSIS — E785 Hyperlipidemia, unspecified: Secondary | ICD-10-CM | POA: Diagnosis not present

## 2014-06-30 DIAGNOSIS — M5416 Radiculopathy, lumbar region: Secondary | ICD-10-CM

## 2014-06-30 DIAGNOSIS — E782 Mixed hyperlipidemia: Secondary | ICD-10-CM | POA: Diagnosis present

## 2014-06-30 LAB — CBC
HEMATOCRIT: 40.3 % (ref 36.0–46.0)
Hemoglobin: 13.5 g/dL (ref 12.0–15.0)
MCH: 30.9 pg (ref 26.0–34.0)
MCHC: 33.5 g/dL (ref 30.0–36.0)
MCV: 92.2 fL (ref 78.0–100.0)
Platelets: 346 10*3/uL (ref 150–400)
RBC: 4.37 MIL/uL (ref 3.87–5.11)
RDW: 13.1 % (ref 11.5–15.5)
WBC: 7.9 10*3/uL (ref 4.0–10.5)

## 2014-06-30 LAB — COMPREHENSIVE METABOLIC PANEL
ALBUMIN: 3.7 g/dL (ref 3.5–5.2)
ALT: 23 U/L (ref 0–35)
AST: 23 U/L (ref 0–37)
Alkaline Phosphatase: 44 U/L (ref 39–117)
Anion gap: 10 (ref 5–15)
BILIRUBIN TOTAL: 0.8 mg/dL (ref 0.3–1.2)
BUN: 11 mg/dL (ref 6–23)
CO2: 28 mmol/L (ref 19–32)
CREATININE: 0.79 mg/dL (ref 0.50–1.10)
Calcium: 9.4 mg/dL (ref 8.4–10.5)
Chloride: 103 mmol/L (ref 96–112)
GFR calc non Af Amer: >90 mL/min (ref >90)
GLUCOSE: 108 mg/dL — AB (ref 70–99)
POTASSIUM: 3.5 mmol/L (ref 3.5–5.1)
Sodium: 141 mmol/L (ref 135–145)
Total Protein: 6.3 g/dL (ref 6.0–8.3)

## 2014-06-30 LAB — URINALYSIS, ROUTINE W REFLEX MICROSCOPIC
Bilirubin Urine: NEGATIVE
Glucose, UA: NEGATIVE mg/dL
Hgb urine dipstick: NEGATIVE
KETONES UR: NEGATIVE mg/dL
Leukocytes, UA: NEGATIVE
Nitrite: NEGATIVE
Protein, ur: NEGATIVE mg/dL
SPECIFIC GRAVITY, URINE: 1.016 (ref 1.005–1.030)
Urobilinogen, UA: 0.2 mg/dL (ref 0.0–1.0)
pH: 5.5 (ref 5.0–8.0)

## 2014-06-30 LAB — CBG MONITORING, ED: GLUCOSE-CAPILLARY: 91 mg/dL (ref 70–99)

## 2014-06-30 MED ORDER — ALUM & MAG HYDROXIDE-SIMETH 200-200-20 MG/5ML PO SUSP: 30.0000 mL | Freq: Four times a day (QID) | ORAL | Status: DC | PRN

## 2014-06-30 MED ORDER — SODIUM CHLORIDE 0.9 % IV SOLN
Freq: Once | INTRAVENOUS | Status: AC
  Administered 2014-06-30: 16:00:00 via INTRAVENOUS

## 2014-06-30 MED ORDER — CITALOPRAM HYDROBROMIDE 20 MG PO TABS
20.0000 mg | ORAL_TABLET | Freq: Every day | ORAL | Status: DC
  Administered 2014-07-01: 20 mg via ORAL
  Filled 2014-06-30: qty 1

## 2014-06-30 MED ORDER — ONDANSETRON HCL 4 MG PO TABS: 4.0000 mg | ORAL_TABLET | Freq: Four times a day (QID) | ORAL | Status: DC | PRN

## 2014-06-30 MED ORDER — ENOXAPARIN SODIUM 40 MG/0.4ML ~~LOC~~ SOLN
40.0000 mg | Freq: Every day | SUBCUTANEOUS | Status: DC
  Administered 2014-06-30: 40 mg via SUBCUTANEOUS
  Filled 2014-06-30 (×2): qty 0.4

## 2014-06-30 MED ORDER — ACETAMINOPHEN 325 MG PO TABS
650.0000 mg | ORAL_TABLET | Freq: Four times a day (QID) | ORAL | Status: DC | PRN
Start: 2014-06-30 — End: 2014-07-01
  Administered 2014-06-30 – 2014-07-01 (×2): 650 mg via ORAL
  Filled 2014-06-30 (×2): qty 2

## 2014-06-30 MED ORDER — HYDROCHLOROTHIAZIDE 25 MG PO TABS
25.0000 mg | ORAL_TABLET | Freq: Every day | ORAL | Status: DC
  Administered 2014-07-01: 25 mg via ORAL
  Filled 2014-06-30: qty 1

## 2014-06-30 MED ORDER — OXYCODONE HCL 5 MG PO TABS
5.0000 mg | ORAL_TABLET | ORAL | Status: DC | PRN
  Filled 2014-06-30 (×2): qty 1

## 2014-06-30 MED ORDER — SODIUM CHLORIDE 0.9 % IV BOLUS (SEPSIS)
1000.0000 mL | Freq: Once | INTRAVENOUS | Status: AC
  Administered 2014-06-30: 1000 mL via INTRAVENOUS

## 2014-06-30 MED ORDER — ONDANSETRON HCL 4 MG/2ML IJ SOLN
4.0000 mg | Freq: Once | INTRAMUSCULAR | Status: AC
  Administered 2014-06-30: 4 mg via INTRAVENOUS

## 2014-06-30 MED ORDER — CLONAZEPAM 1 MG PO TABS: 1.0000 mg | ORAL_TABLET | Freq: Every evening | ORAL | Status: DC | PRN

## 2014-06-30 MED ORDER — PANTOPRAZOLE SODIUM 40 MG PO TBEC
40.0000 mg | DELAYED_RELEASE_TABLET | Freq: Every day | ORAL | Status: DC
  Administered 2014-07-01: 40 mg via ORAL
  Filled 2014-06-30: qty 1

## 2014-06-30 MED ORDER — HYDROMORPHONE HCL 1 MG/ML IJ SOLN: 0.5000 mg | INTRAMUSCULAR | Status: DC | PRN

## 2014-06-30 MED ORDER — LOSARTAN POTASSIUM-HCTZ 100-25 MG PO TABS: 1.0000 | ORAL_TABLET | Freq: Every day | ORAL | Status: DC

## 2014-06-30 MED ORDER — ADULT MULTIVITAMIN W/MINERALS CH
1.0000 | ORAL_TABLET | Freq: Every day | ORAL | Status: DC
  Administered 2014-07-01: 1 via ORAL
  Filled 2014-06-30: qty 1

## 2014-06-30 MED ORDER — SODIUM CHLORIDE 0.9 % IV SOLN
INTRAVENOUS | Status: AC
  Administered 2014-06-30: 21:00:00 via INTRAVENOUS

## 2014-06-30 MED ORDER — BUPROPION HCL ER (XL) 300 MG PO TB24
300.0000 mg | ORAL_TABLET | Freq: Every day | ORAL | Status: DC
  Administered 2014-07-01: 300 mg via ORAL
  Filled 2014-06-30: qty 1

## 2014-06-30 MED ORDER — SODIUM CHLORIDE 0.9 % IJ SOLN
3.0000 mL | Freq: Two times a day (BID) | INTRAMUSCULAR | Status: DC
  Administered 2014-06-30: 3 mL via INTRAVENOUS

## 2014-06-30 MED ORDER — VITAMIN D (ERGOCALCIFEROL) 1.25 MG (50000 UNIT) PO CAPS: 50000.0000 [IU] | ORAL_CAPSULE | ORAL | Status: DC

## 2014-06-30 MED ORDER — ACETAMINOPHEN 650 MG RE SUPP: 650.0000 mg | Freq: Four times a day (QID) | RECTAL | Status: DC | PRN

## 2014-06-30 MED ORDER — ONDANSETRON HCL 4 MG/2ML IJ SOLN: 4.0000 mg | Freq: Four times a day (QID) | INTRAMUSCULAR | Status: DC | PRN

## 2014-06-30 MED ORDER — ONDANSETRON HCL 4 MG/2ML IJ SOLN
INTRAMUSCULAR | Status: AC
  Filled 2014-06-30: qty 2

## 2014-06-30 MED ORDER — ONDANSETRON HCL 4 MG/2ML IJ SOLN: 4.0000 mg | Freq: Once | INTRAMUSCULAR | Status: DC

## 2014-06-30 MED ORDER — LOSARTAN POTASSIUM 50 MG PO TABS
100.0000 mg | ORAL_TABLET | Freq: Every day | ORAL | Status: DC
  Administered 2014-07-01: 100 mg via ORAL
  Filled 2014-06-30: qty 2

## 2014-06-30 MED ORDER — VERAPAMIL HCL ER 240 MG PO TBCR
240.0000 mg | EXTENDED_RELEASE_TABLET | Freq: Every morning | ORAL | Status: DC
  Administered 2014-07-01: 240 mg via ORAL
  Filled 2014-06-30: qty 1

## 2014-06-30 MED ORDER — ONDANSETRON HCL 4 MG/2ML IJ SOLN
4.0000 mg | Freq: Once | INTRAMUSCULAR | Status: AC
  Administered 2014-06-30: 4 mg via INTRAVENOUS
  Filled 2014-06-30: qty 2

## 2014-06-30 MED ORDER — LEVOTHYROXINE SODIUM 100 MCG PO TABS
100.0000 ug | ORAL_TABLET | Freq: Every day | ORAL | Status: DC
  Administered 2014-07-01: 100 ug via ORAL
  Filled 2014-06-30 (×2): qty 1

## 2014-06-30 NOTE — ED Notes (Signed)
MD at bedside. 

## 2014-06-30 NOTE — ED Notes (Signed)
Pt c/o back pain and fatigue x 1 week. Today pt reports unable to urinate. Pt attempted to urinate around 0500 today but very little success.

## 2014-06-30 NOTE — ED Notes (Signed)
Attempted to call report

## 2014-06-30 NOTE — ED Notes (Signed)
Dr. Jenkins at the bedside.  

## 2014-06-30 NOTE — ED Notes (Addendum)
Pt escorted to restroom, tolerated well. After using restroom, pt stated she was feeling nauseated and like she was going to faint, pt placed in wheel chair. Upon entering the room, pt became diaphoretic, pale,  had syncopal episode. Remained in wheelchair during episode, family and this RN supporting patients head and neck. Dr. Christy Gentles aware and in to see patient at this time.

## 2014-06-30 NOTE — ED Notes (Signed)
Dr. Christy Gentles advised to remove catheter at this time, provide PO fluids and evaluate if patient is able to urinate on her own.

## 2014-06-30 NOTE — ED Notes (Addendum)
Nevin Bloodgood, EDT in room, stated pt became diaphoretic, nauseated and unresponsive. Cool cloth placed to pts forehead, Doctor Wickline made aware, RN's to room, IV's and fluids started.

## 2014-06-30 NOTE — ED Notes (Signed)
Foley placed, no urine output at this time. Dr Christy Gentles aware, advised to wait a few minutes, flush catheter if no urine output has occurred.

## 2014-06-30 NOTE — ED Notes (Signed)
Foley catheter irrigated with sterile water, some sediment present. Urine output into foley increased from original output.

## 2014-06-30 NOTE — ED Notes (Signed)
Bladder scan: 561ml, Dr. Christy Gentles aware. Advised to place foley catheter.

## 2014-06-30 NOTE — H&P (Addendum)
[facility] Admission History and Physical       Glenda Evans OFB:[id number] DOB: 1956-02-09 DOA: 06/30/2014  Referring physician: EDP PCP: Shirline Frees, MD  Specialists:   Chief Complaint: Difficulty Voiding  HPI: Glenda Evans is a 59 y.o. female with a history of Asthma , HTN, Hypothyroid, and Fibromyalgia who presented to the ED with complaints of difficulty voiding for the past 12-14 hours.   She has had low back pain for the past week, and denies any history of fall or trauma or previous injury.  She denies any fevers or chills or chest pain , but she has had headaches.   She reports that she became nauseous, and diaphoretic and passed out when she tried to urinate.  And in the ED a bladder scan was perfrmed and found residual urine so a foley catheter was placed and she felt diaphoretic and nauseous, and almost passed out again.   She was referred for admission.     Review of Systems:  Constitutional: No Weight Loss, No Weight Gain, Night Sweats, Fevers, Chills, Dizziness, Light Headedness, Fatigue, or Generalized Weakness HEENT: No Headaches, Difficulty Swallowing,Tooth/Dental Problems,Sore Throat,  No Sneezing, Rhinitis, Ear Ache, Nasal Congestion, or Post Nasal Drip,  Cardio-vascular:  No Chest pain, Orthopnea, PND, Edema in Lower Extremities, Anasarca, Dizziness, Palpitations  Resp: No Dyspnea, No DOE, No Productive Cough, No Non-Productive Cough, No Hemoptysis, No Wheezing.    GI: No Heartburn, Indigestion, Abdominal Pain, Nausea, Vomiting, Diarrhea, Constipation, Hematemesis, Hematochezia, Melena, Change in Bowel Habits,  Loss of Appetite  GU: No Dysuria, No Change in Color of Urine, No Urgency, +Urinary Retention, or Urinary Frequency, No Flank pain.  Musculoskeletal: No Joint Pain or Swelling, No Decreased Range of Motion, + Low Back Pain.  Neurologic: +Syncope, No Seizures, Muscle Weakness, Paresthesia, Vision Disturbance or Loss, No Diplopia, No Vertigo,  No Difficulty Walking,  Skin: No Rash or Lesions. Psych: No Change in Mood or Affect, No Depression or Anxiety, No Memory loss, No Confusion, or Hallucinations   Past Medical History  Diagnosis Date  . Fibromyalgia   . Mitral valve prolapse   . Hypothyroidism   . Depression   . IBS (irritable bowel syndrome)   . Asthma   . Migraine   . GERD (gastroesophageal reflux disease)   . Stein-Leventhal syndrome   . Fatigue   . Arthritis   . Wears glasses   . Anxiety   . Diverticulosis   . HLD (hyperlipidemia)      Past Surgical History  Procedure Laterality Date  . Cesarean section      x 2  . Tonsillectomy    . Abdominal hysterectomy    . Cholecystectomy    . Lipoma excision  11/26/10    thigh x3 cyst      Prior to Admission medications   Medication Sig Start Date End Date Taking? Authorizing Provider  acetaminophen (TYLENOL) 500 MG tablet Take 500 mg by mouth every 6 (six) hours as needed.   Yes Historical Provider, MD  albuterol (PROVENTIL HFA;VENTOLIN HFA) 108 (90 BASE) MCG/ACT inhaler Inhale 1 puff into the lungs every 6 (six) hours as needed for wheezing or shortness of breath.   Yes Historical Provider, MD  buPROPion (WELLBUTRIN XL) 300 MG 24 hr tablet Take 300 mg by mouth daily.     Yes Historical Provider, MD  citalopram (CELEXA) 20 MG tablet Take 20 mg by mouth daily.     Yes Historical Provider, MD  clonazePAM (KLONOPIN) 1 MG  tablet Take by mouth at bedtime as needed. 05/07/14  Yes Historical Provider, MD  HYDROcodone-acetaminophen (NORCO/VICODIN) 5-325 MG per tablet Take 2 tablets by mouth every 6 (six) hours as needed for moderate pain.   Yes Historical Provider, MD  levothyroxine (SYNTHROID, LEVOTHROID) 100 MCG tablet Take 100 mcg by mouth every morning. On an empty stomach 05/20/14  Yes Historical Provider, MD  losartan-hydrochlorothiazide (HYZAAR) 100-25 MG per tablet Take 1 tablet by mouth daily.   Yes Historical Provider, MD  Multiple Vitamin (MULTIVITAMIN) tablet  Take 1 tablet by mouth daily.   Yes Historical Provider, MD  NEXIUM 40 MG capsule Take 40 mg by mouth daily. 05/30/14  Yes Historical Provider, MD  omeprazole (PRILOSEC) 20 MG capsule Take 20 mg by mouth 2 (two) times daily.    Yes Historical Provider, MD  verapamil (CALAN-SR) 240 MG CR tablet Take 240 mg by mouth every morning. 05/17/14  Yes Historical Provider, MD  Vitamin D, Ergocalciferol, (DRISDOL) 50000 UNITS CAPS capsule Take 50,000 Units by mouth every 7 (seven) days.   Yes Historical Provider, MD     Allergies  Allergen Reactions  . Augmentin [Amoxicillin-Pot Clavulanate]   . Cymbalta [Duloxetine Hcl]   . Erythromycin   . Escitalopram Oxalate   . Lyrica [Pregabalin]   . Macrobid [Nitrofurantoin Macrocrystal]   . Neurontin [Gabapentin]   . Ofloxacin   . Pamelor   . Relafen [Nabumetone]   . Septra [Bactrim]   . Skelaxin   . Sulfa Antibiotics     Social History:  reports that she has never smoked. She does not have any smokeless tobacco history on file. She reports that she does not drink alcohol or use illicit drugs.    Family History  Problem Relation Age of Onset  . COPD Father   . Hypertension Father   . Diabetes Sister   . Breast cancer Maternal Grandmother   . Ovarian cancer Paternal Aunt   . Colon polyps Mother   . Colon polyps Sister   . Colon polyps Father   . Colon polyps Maternal Aunt     x 2  . Colon polyps Maternal Grandmother   . Diabetes Maternal Grandmother   . Diabetes Maternal Aunt   . Heart disease Maternal Grandmother   . Heart disease Sister     MVP  . Heart disease Mother     MVP  . COPD Maternal Grandmother   . COPD Maternal Aunt        Physical Exam:  GEN:  Pleasant  Well Nourished and Well Developed  59 y.o. Caucasian female examined and in no acute distress; cooperative with exam Filed Vitals:   06/30/14 1815 06/30/14 1845 06/30/14 1911 06/30/14 2012  BP: 117/71 116/72 117/73 124/70  Pulse: 61 53 53 62  Temp:   97.4 F (36.3 C)  98.1 F (36.7 C)  TempSrc:    Oral  Resp: 14 16 16 18   Height:      Weight:      SpO2: 100% 99% 99% 95%   Blood pressure 124/70, pulse 62, temperature 98.1 F (36.7 C), temperature source Oral, resp. rate 18, height 5\' 5"  (1.651 m), weight 88.905 kg (196 lb), SpO2 95 %. PSYCH: She is alert and oriented x4; does not appear anxious does not appear depressed; affect is normal HEENT: Normocephalic and Atraumatic, Mucous membranes pink; PERRLA; EOM intact; Fundi:  Benign;  No scleral icterus, Nares: Patent, Oropharynx: Clear, Fair Dentition,    Neck:  FROM, No Cervical Lymphadenopathy nor  Thyromegaly or Carotid Bruit; No JVD; Breasts:: Not examined CHEST WALL: No tenderness CHEST: Normal respiration, clear to auscultation bilaterally HEART: Regular rate and rhythm; no murmurs rubs or gallops BACK: No kyphosis or scoliosis; No CVA tenderness ABDOMEN: Positive Bowel Sounds, Soft Non-Tender, No Rebound or Guarding; No Masses, No Organomegaly, No Pannus; No Intertriginous candida. Rectal Exam: Not done EXTREMITIES: No Cyanosis, Clubbing, or Edema; No Ulcerations. Genitalia: not examined PULSES: 2+ and symmetric SKIN: Normal hydration no rash or ulceration CNS:  Alert and Oriented x 4, No Focal Deficits Vascular: pulses palpable throughout    Labs on Admission:  Basic Metabolic Panel:  Recent Labs Lab 06/30/14 1305  NA 141  K 3.5  CL 103  CO2 28  GLUCOSE 108*  BUN 11  CREATININE 0.79  CALCIUM 9.4   Liver Function Tests:  Recent Labs Lab 06/30/14 1305  AST 23  ALT 23  ALKPHOS 44  BILITOT 0.8  PROT 6.3  ALBUMIN 3.7   No results for input(s): LIPASE, AMYLASE in the last 168 hours. No results for input(s): AMMONIA in the last 168 hours. CBC:  Recent Labs Lab 06/30/14 1305  WBC 7.9  HGB 13.5  HCT 40.3  MCV 92.2  PLT 346   Cardiac Enzymes: No results for input(s): CKTOTAL, CKMB, CKMBINDEX, TROPONINI in the last 168 hours.  BNP (last 3 results) No results for  input(s): BNP in the last 8760 hours.  ProBNP (last 3 results) No results for input(s): PROBNP in the last 8760 hours.  CBG:  Recent Labs Lab 06/30/14 1911  GLUCAP 91    Radiological Exams on Admission: No results found.   EKG: Independently reviewed. Sinus Bradycardia Rate = 55 No acute Changes   Assessment/Plan:   58 y.o. female with  Principal Problem:   1.   Syncope- May be due to Micturition    Syncope Work up   Telemetry Monitoring   Check Orthostatics      Active Problems:   2.   Urinary retention   Trial on Bethanechol   Straight Cath PRN   May Need a Urology Consultation or Referral     3.   Low back pain   MRI of Lumbar and Sacral Spine  in AM due to #2 and #3     4.   Asthma   Albuterol Nebs PRN     5.   Essential hypertension   Continue Verapamil and Losartan /HCTZ as BP tolerates   Monitor BPs     6.   Hypothyroidism   Continue Levothyroxine Rx   Check TSH Level     7.   Mitral valve prolapse   2D Echo in AM     8.   HLD (hyperlipidemia)   Continue      9.   Fibromyalgia   On Hydrocodone/APAP rx at home    10.  DVT Prophylaxis   Lovenox         Code Status:     FULL CODE     Family Communication:   Family at Bedside     Disposition Plan:   Observation Status        Time spent:  [place] [facility] Pager [phone]   If [place] Please Contact the Day Rounding Team MD for [facility]  If 7PM-7AM, Please Contact Night-Floor Coverage  [email or web address] Password [email or web address] 06/30/2014, 8:29 PM     ADDENDUM:   Patient was seen and examined on 06/30/2014

## 2014-06-30 NOTE — ED Notes (Addendum)
Pt awake, responding to verbal stimuli at this time, oriented. Vitals stable.

## 2014-06-30 NOTE — ED Provider Notes (Signed)
CSN: [id number]     Arrival date & time 06/30/14  1247 History   First MD Initiated Contact with Patient 06/30/14 1507     Chief Complaint  Patient presents with  . Urinary Retention  . Back Pain  . Fatigue     The history is provided by the patient.  Patient presents for urinary retention.  This started at 5am this morning and it is worsening.  Nothing improves her symptoms, and movement worsens her symptoms.  She has never had this before.  No fever/vomiting.  No leg weakness.  She is ambulatory.  No fecal incontinence.  No cp.  She has suprapubic pain but no other abd pain.  She reports intermittent low back pain but reports h/o back pain.  No h/o back surgery.  No h/o diabetes.  No new medications.   She also reports intermittent generalized fatigue and also headache.   No falls/trauma No new OTC meds used  Past Medical History  Diagnosis Date  . Fibromyalgia   . Mitral valve prolapse   . Hypothyroidism   . Depression   . IBS (irritable bowel syndrome)   . Asthma   . Migraine   . GERD (gastroesophageal reflux disease)   . Stein-Leventhal syndrome   . Fatigue   . Arthritis   . Wears glasses   . Anxiety   . Diverticulosis   . HLD (hyperlipidemia)    Past Surgical History  Procedure Laterality Date  . Cesarean section      x 2  . Tonsillectomy    . Abdominal hysterectomy    . Cholecystectomy    . Lipoma excision  11/26/10    thigh x3 cyst   Family History  Problem Relation Age of Onset  . COPD Father   . Hypertension Father   . Diabetes Sister   . Breast cancer Maternal Grandmother   . Ovarian cancer Paternal Aunt   . Colon polyps Mother   . Colon polyps Sister   . Colon polyps Father   . Colon polyps Maternal Aunt     x 2  . Colon polyps Maternal Grandmother   . Diabetes Maternal Grandmother   . Diabetes Maternal Aunt   . Heart disease Maternal Grandmother   . Heart disease Sister     MVP  . Heart disease Mother     MVP  . COPD Maternal Grandmother   .  COPD Maternal Aunt    History  Substance Use Topics  . Smoking status: Never Smoker   . Smokeless tobacco: Not on file  . Alcohol Use: No   OB History    No data available     Review of Systems  Constitutional: Positive for fatigue. Negative for fever.  Cardiovascular: Negative for chest pain.  Gastrointestinal: Positive for nausea. Negative for vomiting.  Genitourinary: Positive for difficulty urinating.  Neurological: Positive for headaches.  All other systems reviewed and are negative.     Allergies  Augmentin; Cymbalta; Erythromycin; Escitalopram oxalate; Lyrica; Macrobid; Neurontin; Ofloxacin; Pamelor; Relafen; Septra; Skelaxin; and Sulfa antibiotics  Home Medications   Prior to Admission medications   Medication Sig Start Date End Date Taking? Authorizing Provider  buPROPion (WELLBUTRIN XL) 300 MG 24 hr tablet Take 300 mg by mouth daily.      Historical Provider, MD  citalopram (CELEXA) 20 MG tablet Take 20 mg by mouth daily.      Historical Provider, MD  clonazePAM (KLONOPIN) 0.5 MG tablet Take 0.5 mg by mouth 2 (two) times  daily as needed.      Historical Provider, MD  colesevelam (WELCHOL) 625 MG tablet Take by mouth 2 (two) times daily with a meal.    Historical Provider, MD  estradiol (ESTRACE) 1 MG tablet Take 1 mg by mouth daily.      Historical Provider, MD  fluticasone (FLONASE) 50 MCG/ACT nasal spray Place into both nostrils daily.    Historical Provider, MD  levothyroxine (SYNTHROID, LEVOTHROID) 125 MCG tablet Take 125 mcg by mouth daily before breakfast.    Historical Provider, MD  losartan-hydrochlorothiazide (HYZAAR) 100-25 MG per tablet Take 1 tablet by mouth daily.    Historical Provider, MD  omeprazole (PRILOSEC) 20 MG capsule Take 20 mg by mouth 2 (two) times daily.     Historical Provider, MD  verapamil (COVERA HS) 240 MG (CO) 24 hr tablet Take 240 mg by mouth at bedtime.    Historical Provider, MD  Vitamin D, Ergocalciferol, (DRISDOL) 50000 UNITS CAPS  capsule Take 50,000 Units by mouth every 7 (seven) days.    Historical Provider, MD   BP 123/78 mmHg  Pulse 65  Temp(Src) 97.9 F (36.6 C) (Oral)  Resp 18  Ht 5\' 5"  (1.651 m)  Wt 196 lb (88.905 kg)  BMI 32.62 kg/m2  SpO2 95% Physical Exam CONSTITUTIONAL: Well developed/well nourished HEAD: Normocephalic/atraumatic EYES: EOMI/PERRL ENMT: Mucous membranes moist NECK: supple no meningeal signs SPINE/BACK:entire spine nontender, No bruising/crepitance/stepoffs noted to spine She reports pain in the lumbar paraspinal region CV: S1/S2 noted, no murmurs/rubs/gallops noted LUNGS: Lungs are clear to auscultation bilaterally, no apparent distress ABDOMEN: soft, mild suprapubic tenderness, no rebound or guarding GU:no cva tenderness NEURO: Awake/alert,  equal motor 5/5 strength noted with the following: hip flexion/knee flexion/extension, foot dorsi/plantar flexion, great toe extension intact bilaterally, no clonus bilaterally, plantar reflex appropriate (toes downgoing), no sensory deficit in any dermatome.  Equal patellar/achilles reflex noted (2+) in bilateral lower extremities.  Pt is able to ambulate unassisted. EXTREMITIES: pulses normal, full ROM SKIN: warm, color normal PSYCH: no abnormalities of mood noted, alert and oriented to situation   ED Course  Procedures   3:33 PM Pt well appearing She does have >560ml urine Will place foley No focal neuro deficits noted, she is ambulatory, doubt cauda equina No obvious medication cause noted 3:55 PM Pt had foley placed and had pain during procedure and became nauseous/diaphoretic and felt weak Will place on monitor and order EKG Suspect vagal response to pain 6:41 PM At her request foley was removed to try to spontaneous urinate While going to BR, she felt light headed and had near syncopal episode She appears ill Unclear cause of syncope Will admit Denies CP at this time 6:56 PM D/w dr Arnoldo Morale will admit to obs tele for  recurrent syncope   Labs Review Labs Reviewed  COMPREHENSIVE METABOLIC PANEL - Abnormal; Notable for the following:    Glucose, Bld 108 (*)    All other components within normal limits  URINE CULTURE  CBC  URINALYSIS, ROUTINE W REFLEX MICROSCOPIC   Medications  ondansetron (ZOFRAN) 4 MG/2ML injection (not administered)  ondansetron (ZOFRAN) injection 4 mg (4 mg Intravenous Given 06/30/14 1600)  0.9 %  sodium chloride infusion ( Intravenous Stopped 06/30/14 1628)  ondansetron (ZOFRAN) injection 4 mg (4 mg Intravenous Given 06/30/14 1837)  sodium chloride 0.9 % bolus 1,000 mL (1,000 mLs Intravenous New Bag/Given 06/30/14 1854)     EKG Interpretation  Date/Time:  Sunday June 30 2014 18:41:51 EDT Ventricular Rate:  55 PR Interval:  143 QRS Duration: 102 QT Interval:  476 QTC Calculation: 455 R Axis:   17 Text Interpretation:  Sinus rhythm No significant change since last tracing Confirmed by Christy Gentles  MD, Sueo Cullen ([id number]) on 06/30/2014 6:45:16 PM       MDM   Final diagnoses:  Syncope, unspecified syncope type  Urinary retention    Nursing notes including past medical history and social history reviewed and considered in documentation Labs/vital reviewed myself and considered during evaluation     Ripley Fraise, MD 06/30/14 1856

## 2014-06-30 NOTE — ED Notes (Signed)
Foley catheter showing urine output at this time.

## 2014-07-01 ENCOUNTER — Observation Stay (HOSPITAL_COMMUNITY): Payer: Medicare Other

## 2014-07-01 DIAGNOSIS — R55 Syncope and collapse: Secondary | ICD-10-CM | POA: Diagnosis not present

## 2014-07-01 DIAGNOSIS — E038 Other specified hypothyroidism: Secondary | ICD-10-CM

## 2014-07-01 DIAGNOSIS — M5127 Other intervertebral disc displacement, lumbosacral region: Secondary | ICD-10-CM | POA: Diagnosis not present

## 2014-07-01 DIAGNOSIS — R339 Retention of urine, unspecified: Secondary | ICD-10-CM | POA: Diagnosis not present

## 2014-07-01 DIAGNOSIS — J4520 Mild intermittent asthma, uncomplicated: Secondary | ICD-10-CM | POA: Diagnosis not present

## 2014-07-01 LAB — BASIC METABOLIC PANEL
Anion gap: 3 — ABNORMAL LOW (ref 5–15)
BUN: 8 mg/dL (ref 6–23)
CALCIUM: 8.7 mg/dL (ref 8.4–10.5)
CO2: 32 mmol/L (ref 19–32)
Chloride: 107 mmol/L (ref 96–112)
Creatinine, Ser: 0.79 mg/dL (ref 0.50–1.10)
Glucose, Bld: 128 mg/dL — ABNORMAL HIGH (ref 70–99)
Potassium: 3.5 mmol/L (ref 3.5–5.1)
Sodium: 142 mmol/L (ref 135–145)

## 2014-07-01 LAB — URINE CULTURE
COLONY COUNT: NO GROWTH
CULTURE: NO GROWTH

## 2014-07-01 LAB — CBC
HCT: 36.5 % (ref 36.0–46.0)
HEMOGLOBIN: 12.2 g/dL (ref 12.0–15.0)
MCH: 31.5 pg (ref 26.0–34.0)
MCHC: 33.4 g/dL (ref 30.0–36.0)
MCV: 94.3 fL (ref 78.0–100.0)
Platelets: 280 10*3/uL (ref 150–400)
RBC: 3.87 MIL/uL (ref 3.87–5.11)
RDW: 13.4 % (ref 11.5–15.5)
WBC: 7.6 10*3/uL (ref 4.0–10.5)

## 2014-07-01 MED ORDER — LORAZEPAM 2 MG/ML IJ SOLN
1.0000 mg | Freq: Once | INTRAMUSCULAR | Status: AC
  Administered 2014-07-01: 1 mg via INTRAVENOUS

## 2014-07-01 MED ORDER — BETHANECHOL CHLORIDE 10 MG PO TABS
10.0000 mg | ORAL_TABLET | Freq: Three times a day (TID) | ORAL | Status: DC
  Administered 2014-07-01: 10 mg via ORAL
  Filled 2014-07-01 (×3): qty 1

## 2014-07-01 MED ORDER — LORAZEPAM 2 MG/ML IJ SOLN
INTRAMUSCULAR | Status: AC
  Administered 2014-07-01: 1 mg via INTRAVENOUS
  Filled 2014-07-01: qty 1

## 2014-07-01 MED ORDER — GADOBENATE DIMEGLUMINE 529 MG/ML IV SOLN
20.0000 mL | Freq: Once | INTRAVENOUS | Status: AC | PRN
  Administered 2014-07-01: 20 mL via INTRAVENOUS

## 2014-07-01 NOTE — Progress Notes (Signed)
UR completed 

## 2014-07-01 NOTE — Progress Notes (Signed)
Pt has voided 2 times since she has been on the floor but neither were measurable. Bladder scanned pt to be sure that she wasn't still retaining. Bladder scan showed 58mL. Pt has now been provided with a hat for the toilet and instructions on how to use it. Pt is aware that we need to measure her output. Will continue to monitor.

## 2014-07-01 NOTE — Discharge Summary (Addendum)
Physician Discharge Summary  Glenda Evans YTK:[id number] DOB: Aug 18, 1955 DOA: 06/30/2014  PCP: Shirline Frees, MD  Admit date: 06/30/2014 Discharge date: 07/01/2014  Time spent: 45 minutes  Recommendations for Outpatient Follow-up:  1. Have provided number for [facility] if urological issues recur   Discharge Condition: stable Diet recommendation: heart healthy  Discharge Diagnoses:  Principal Problem:   Vasovagal syncope Active Problems:   Urinary retention- possibly related to Vicodin   Essential hypertension   Hypothyroidism   Mitral valve prolapse   HLD (hyperlipidemia)   Low back pain   Fibromyalgia   Asthma   Asthma, mild intermittent   History of present illness:  Glenda Evans is a 59 y.o. female with a history of Asthma , HTN, Hypothyroid, and Fibromyalgia who presented to the ED with complaints of difficulty voiding for the past 12-14 hours. She has had low back pain for the past week, and denies any history of fall or trauma or previous injury. She denies any fevers or chills or chest pain , but she has had headaches. She reports that she became nauseous, and diaphoretic and passed out when she tried to urinate. And in the ED a bladder scan was perfrmed and found residual urine so a foley catheter was placed and she felt diaphoretic and nauseous, and almost passed out again. She was referred for admission.  Hospital Course:  Syncope - likely vaso-vagal with possible superimposed dehydration- patient stopped drinking fluids when she was unable to void- symptoms have not recurred  Urinary retention - after taking 2 Vicodin - she does not take it on a regular basis - foley placed in ER- once bladder drained, foley removed- now voiding well without issues- no feeling of incomplete emptying after voiding- no urgency or dysruia - have asked her to continue to monitor for a relation of these symptoms to the Vidocin - know to f/u with Urology if she  continues to have mild- mod issues- will come back to ER if retention occurs again  Chronic back pain - MRI ordered to ensure there was not relation to above mentioned urinary retention- results are mentioned below- no acute issues noted- pain is currently mild and in lower back- she has not had an exacerbation in the pain recently.    Procedures:  none  Consultations:  none  Discharge Exam: Filed Weights   06/30/14 1255  Weight: 88.905 kg (196 lb)   Filed Vitals:   07/01/14 1030  BP: 120/66  Pulse:   Temp:   Resp:     General: AAO x 3, no distress Cardiovascular: RRR, no murmurs  Respiratory: clear to auscultation bilaterally GI: soft, non-tender, non-distended, bowel sound positive Back: mild tenderness in lumbar area bilaterally just lateral to spine  Discharge Instructions You were cared for by a hospitalist during your hospital stay. If you have any questions about your discharge medications or the care you received while you were in the hospital after you are discharged, you can call the unit and asked to speak with the hospitalist on call if the hospitalist that took care of you is not available. Once you are discharged, your primary care physician will handle any further medical issues. Please note that NO REFILLS for any discharge medications will be authorized once you are discharged, as it is imperative that you return to your primary care physician (or establish a relationship with a primary care physician if you do not have one) for your aftercare needs so that they can reassess  your need for medications and monitor your lab values.     Medication List    ASK your doctor about these medications        acetaminophen 500 MG tablet  Commonly known as:  TYLENOL  Take 500 mg by mouth every 6 (six) hours as needed.     albuterol 108 (90 BASE) MCG/ACT inhaler  Commonly known as:  PROVENTIL HFA;VENTOLIN HFA  Inhale 1 puff into the lungs every 6 (six) hours as  needed for wheezing or shortness of breath.     buPROPion 300 MG 24 hr tablet  Commonly known as:  WELLBUTRIN XL  Take 300 mg by mouth daily.     citalopram 20 MG tablet  Commonly known as:  CELEXA  Take 20 mg by mouth daily.     clonazePAM 1 MG tablet  Commonly known as:  KLONOPIN  Take by mouth at bedtime as needed.     HYDROcodone-acetaminophen 5-325 MG per tablet  Commonly known as:  NORCO/VICODIN  Take 2 tablets by mouth every 6 (six) hours as needed for moderate pain.     levothyroxine 100 MCG tablet  Commonly known as:  SYNTHROID, LEVOTHROID  Take 100 mcg by mouth every morning. On an empty stomach     losartan-hydrochlorothiazide 100-25 MG per tablet  Commonly known as:  HYZAAR  Take 1 tablet by mouth daily.     multivitamin tablet  Take 1 tablet by mouth daily.     NEXIUM 40 MG capsule  Generic drug:  esomeprazole  Take 40 mg by mouth daily.     omeprazole 20 MG capsule  Commonly known as:  PRILOSEC  Take 20 mg by mouth 2 (two) times daily.     verapamil 240 MG CR tablet  Commonly known as:  CALAN-SR  Take 240 mg by mouth every morning.     Vitamin D (Ergocalciferol) 50000 UNITS Caps capsule  Commonly known as:  DRISDOL  Take 50,000 Units by mouth every 7 (seven) days.       Allergies  Allergen Reactions  . Augmentin [Amoxicillin-Pot Clavulanate]   . Cymbalta [Duloxetine Hcl]   . Erythromycin   . Escitalopram Oxalate   . Lyrica [Pregabalin]   . Macrobid [Nitrofurantoin Macrocrystal]   . Neurontin [Gabapentin]   . Ofloxacin   . Pamelor   . Relafen [Nabumetone]   . Septra [Bactrim]   . Skelaxin   . Sulfa Antibiotics       The results of significant diagnostics from this hospitalization (including imaging, microbiology, ancillary and laboratory) are listed below for reference.    Significant Diagnostic Studies: Mr Lumbar Spine W Wo Contrast  07/01/2014   CLINICAL DATA:  Difficulty voiding for the last day. Low back pain for the last week.   EXAM: MRI LUMBAR SPINE WITHOUT AND WITH CONTRAST  TECHNIQUE: Multiplanar and multiecho pulse sequences of the lumbar spine were obtained without and with intravenous contrast.  CONTRAST:  55mL MULTIHANCE GADOBENATE DIMEGLUMINE 529 MG/ML IV SOLN  COMPARISON:  CT 10/12/2013.  MRI 01/13/2013.  FINDINGS: There is no abnormality at T11-12, T12-L1 or L1-2. The distal cord and conus are normal with conus tip at L1.  L2-3:  Minimal desiccation and bulging of the disc.  No stenosis.  L3-4:  Normal interspace.  L4-5:  Normal interspace.  L5-S1: Mild bulging of the disc with small annular fissure. Some congenital asymmetry of the facet joints but no hypertrophic degenerative change or neural compression.  After contrast administration, no abnormal enhancement  occurs.  No progressive change since the previous study. A disc bulge L5-S1 has actually involuted.  IMPRESSION: No acute or likely significant finding. Minimal noncompressive disc bulges at L2-3 and L5-S1.   Electronically Signed   By: Nelson Chimes M.D.   On: 07/01/2014 09:14    Microbiology: No results found for this or any previous visit (from the past 240 hour(s)).   Labs: Basic Metabolic Panel:  Recent Labs Lab 06/30/14 1305 07/01/14 1018  NA 141 142  K 3.5 3.5  CL 103 107  CO2 28 32  GLUCOSE 108* 128*  BUN 11 8  CREATININE 0.79 0.79  CALCIUM 9.4 8.7   Liver Function Tests:  Recent Labs Lab 06/30/14 1305  AST 23  ALT 23  ALKPHOS 44  BILITOT 0.8  PROT 6.3  ALBUMIN 3.7   No results for input(s): LIPASE, AMYLASE in the last 168 hours. No results for input(s): AMMONIA in the last 168 hours. CBC:  Recent Labs Lab 06/30/14 1305 07/01/14 1018  WBC 7.9 7.6  HGB 13.5 12.2  HCT 40.3 36.5  MCV 92.2 94.3  PLT 346 280   Cardiac Enzymes: No results for input(s): CKTOTAL, CKMB, CKMBINDEX, TROPONINI in the last 168 hours. BNP: BNP (last 3 results) No results for input(s): BNP in the last 8760 hours.  ProBNP (last 3 results) No  results for input(s): PROBNP in the last 8760 hours.  CBG:  Recent Labs Lab 06/30/14 1911  GLUCAP 91       Signed:  Debbe Odea, MD [facility] 07/01/2014, 12:04 PM

## 2014-07-05 DIAGNOSIS — E039 Hypothyroidism, unspecified: Secondary | ICD-10-CM | POA: Diagnosis not present

## 2014-07-05 DIAGNOSIS — R339 Retention of urine, unspecified: Secondary | ICD-10-CM | POA: Diagnosis not present

## 2014-07-08 ENCOUNTER — Other Ambulatory Visit: Payer: Self-pay | Admitting: Family Medicine

## 2014-07-08 ENCOUNTER — Other Ambulatory Visit: Payer: Self-pay

## 2014-07-08 DIAGNOSIS — R1030 Lower abdominal pain, unspecified: Secondary | ICD-10-CM

## 2014-07-08 DIAGNOSIS — N63 Unspecified lump in unspecified breast: Secondary | ICD-10-CM

## 2014-07-08 DIAGNOSIS — R339 Retention of urine, unspecified: Secondary | ICD-10-CM

## 2014-07-09 ENCOUNTER — Other Ambulatory Visit: Payer: Self-pay | Admitting: Obstetrics and Gynecology

## 2014-07-09 DIAGNOSIS — N63 Unspecified lump in unspecified breast: Secondary | ICD-10-CM

## 2014-07-09 DIAGNOSIS — N6320 Unspecified lump in the left breast, unspecified quadrant: Secondary | ICD-10-CM

## 2014-07-09 DIAGNOSIS — N644 Mastodynia: Secondary | ICD-10-CM | POA: Diagnosis not present

## 2014-07-11 ENCOUNTER — Ambulatory Visit
Admission: RE | Admit: 2014-07-11 | Discharge: 2014-07-11 | Disposition: A | Discharge status: Home / Self Care | Payer: Medicare Other | Source: Ambulatory Visit | Attending: Family Medicine | Admitting: Family Medicine

## 2014-07-11 DIAGNOSIS — R1030 Lower abdominal pain, unspecified: Secondary | ICD-10-CM

## 2014-07-11 DIAGNOSIS — Z9049 Acquired absence of other specified parts of digestive tract: Secondary | ICD-10-CM | POA: Diagnosis not present

## 2014-07-11 DIAGNOSIS — R339 Retention of urine, unspecified: Secondary | ICD-10-CM | POA: Diagnosis not present

## 2014-07-11 DIAGNOSIS — K760 Fatty (change of) liver, not elsewhere classified: Secondary | ICD-10-CM | POA: Diagnosis not present

## 2014-07-12 ENCOUNTER — Ambulatory Visit
Admission: RE | Admit: 2014-07-12 | Discharge: 2014-07-12 | Disposition: A | Discharge status: Home / Self Care | Payer: Medicare Other | Source: Ambulatory Visit | Attending: Obstetrics and Gynecology | Admitting: Obstetrics and Gynecology

## 2014-07-12 DIAGNOSIS — R928 Other abnormal and inconclusive findings on diagnostic imaging of breast: Secondary | ICD-10-CM | POA: Diagnosis not present

## 2014-07-12 DIAGNOSIS — N644 Mastodynia: Secondary | ICD-10-CM

## 2014-07-12 DIAGNOSIS — N6320 Unspecified lump in the left breast, unspecified quadrant: Secondary | ICD-10-CM

## 2014-07-12 DIAGNOSIS — N63 Unspecified lump in unspecified breast: Secondary | ICD-10-CM

## 2014-07-29 DIAGNOSIS — D1030 Benign neoplasm of unspecified part of mouth: Secondary | ICD-10-CM | POA: Diagnosis not present

## 2014-08-08 DIAGNOSIS — D1039 Benign neoplasm of other parts of mouth: Secondary | ICD-10-CM | POA: Diagnosis not present

## 2014-09-14 DIAGNOSIS — R05 Cough: Secondary | ICD-10-CM | POA: Diagnosis not present

## 2014-09-14 DIAGNOSIS — J019 Acute sinusitis, unspecified: Secondary | ICD-10-CM | POA: Diagnosis not present

## 2014-09-18 DIAGNOSIS — J209 Acute bronchitis, unspecified: Secondary | ICD-10-CM | POA: Diagnosis not present

## 2014-09-18 DIAGNOSIS — J0100 Acute maxillary sinusitis, unspecified: Secondary | ICD-10-CM | POA: Diagnosis not present

## 2014-12-09 DIAGNOSIS — N898 Other specified noninflammatory disorders of vagina: Secondary | ICD-10-CM | POA: Diagnosis not present

## 2014-12-09 DIAGNOSIS — Z01411 Encounter for gynecological examination (general) (routine) with abnormal findings: Secondary | ICD-10-CM | POA: Diagnosis not present

## 2014-12-17 DIAGNOSIS — N760 Acute vaginitis: Secondary | ICD-10-CM | POA: Diagnosis not present

## 2014-12-17 DIAGNOSIS — F321 Major depressive disorder, single episode, moderate: Secondary | ICD-10-CM | POA: Diagnosis not present

## 2014-12-31 DIAGNOSIS — M859 Disorder of bone density and structure, unspecified: Secondary | ICD-10-CM | POA: Diagnosis not present

## 2014-12-31 DIAGNOSIS — M8589 Other specified disorders of bone density and structure, multiple sites: Secondary | ICD-10-CM | POA: Diagnosis not present

## 2015-01-15 DIAGNOSIS — F324 Major depressive disorder, single episode, in partial remission: Secondary | ICD-10-CM | POA: Diagnosis not present

## 2015-01-15 DIAGNOSIS — E780 Pure hypercholesterolemia: Secondary | ICD-10-CM | POA: Diagnosis not present

## 2015-01-15 DIAGNOSIS — E039 Hypothyroidism, unspecified: Secondary | ICD-10-CM | POA: Diagnosis not present

## 2015-01-15 DIAGNOSIS — E559 Vitamin D deficiency, unspecified: Secondary | ICD-10-CM | POA: Diagnosis not present

## 2015-01-15 DIAGNOSIS — R5383 Other fatigue: Secondary | ICD-10-CM | POA: Diagnosis not present

## 2015-01-20 ENCOUNTER — Ambulatory Visit: Payer: Medicare Other | Admitting: Podiatry

## 2015-01-22 ENCOUNTER — Encounter: Payer: Self-pay | Admitting: Sports Medicine

## 2015-01-22 ENCOUNTER — Ambulatory Visit (INDEPENDENT_AMBULATORY_CARE_PROVIDER_SITE_OTHER): Payer: Medicare Other

## 2015-01-22 ENCOUNTER — Ambulatory Visit (INDEPENDENT_AMBULATORY_CARE_PROVIDER_SITE_OTHER): Payer: Medicare Other | Admitting: Sports Medicine

## 2015-01-22 VITALS — BP 126/74 | HR 73 | Resp 14

## 2015-01-22 DIAGNOSIS — M7750 Other enthesopathy of unspecified foot: Secondary | ICD-10-CM

## 2015-01-22 DIAGNOSIS — R52 Pain, unspecified: Secondary | ICD-10-CM

## 2015-01-22 DIAGNOSIS — M6588 Other synovitis and tenosynovitis, other site: Secondary | ICD-10-CM | POA: Diagnosis not present

## 2015-01-22 DIAGNOSIS — M779 Enthesopathy, unspecified: Secondary | ICD-10-CM

## 2015-01-22 DIAGNOSIS — M778 Other enthesopathies, not elsewhere classified: Secondary | ICD-10-CM

## 2015-01-22 DIAGNOSIS — M7751 Other enthesopathy of right foot: Secondary | ICD-10-CM | POA: Diagnosis not present

## 2015-01-22 MED ORDER — TRIAMCINOLONE ACETONIDE 10 MG/ML IJ SUSP: 10.0000 mg | Freq: Once | INTRAMUSCULAR | Status: AC
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
## 2015-02-05 ENCOUNTER — Ambulatory Visit: Payer: Medicare Other | Admitting: Sports Medicine

## 2015-02-06 ENCOUNTER — Ambulatory Visit (INDEPENDENT_AMBULATORY_CARE_PROVIDER_SITE_OTHER): Payer: Medicare Other | Admitting: Sports Medicine

## 2015-02-06 ENCOUNTER — Encounter: Payer: Self-pay | Admitting: Sports Medicine

## 2015-02-06 DIAGNOSIS — M779 Enthesopathy, unspecified: Principal | ICD-10-CM

## 2015-02-06 DIAGNOSIS — R52 Pain, unspecified: Secondary | ICD-10-CM

## 2015-02-06 DIAGNOSIS — M778 Other enthesopathies, not elsewhere classified: Secondary | ICD-10-CM

## 2015-02-06 DIAGNOSIS — R600 Localized edema: Secondary | ICD-10-CM | POA: Diagnosis not present

## 2015-02-06 DIAGNOSIS — M6588 Other synovitis and tenosynovitis, other site: Secondary | ICD-10-CM | POA: Diagnosis not present

## 2015-02-06 DIAGNOSIS — M7751 Other enthesopathy of right foot: Secondary | ICD-10-CM | POA: Diagnosis not present

## 2015-02-06 DIAGNOSIS — N898 Other specified noninflammatory disorders of vagina: Secondary | ICD-10-CM | POA: Diagnosis not present

## 2015-02-06 DIAGNOSIS — R10819 Abdominal tenderness, unspecified site: Secondary | ICD-10-CM | POA: Diagnosis not present

## 2015-02-06 DIAGNOSIS — M7750 Other enthesopathy of unspecified foot: Secondary | ICD-10-CM

## 2015-02-06 NOTE — Progress Notes (Signed)
Patient ID: Glenda Evans, female   DOB: 1955-06-17, 59 y.o.   MRN: [id number] Subjective: Glenda Evans is a 59 y.o. female patient who returns to office for follow up evaluation of Right  foot pain. Patient  states that she is feeling better; 70% improved. Last visit Patient had steroid injection #1 to dorsum of right foot without complication. Patient has been using topical voltaren, CAM walker 1 week, and ACE with relief in symptoms. Patient denies any other pedal complaints.   Objective:  Physical Exam  General: Well developed, nourished, in no acute distress, alert and oriented x3   Dermatological: Skin is warm, dry and supple bilateral. Nails x 10 are well manicured; remaining integument appears unremarkable at this time. There are no open sores, no preulcerative lesions, no rash or signs of infection present.  Neruovascular status: Intact. Focal edema to dorsum of right foot, much improved.  Musculoskeletal: No pain with tuning fork to dorsum of right foot. No pain with dorsiflexion of the right foot and direct palpation to the extensor tendons of 2-4. Decreased tenderness with palpation extensor tendon to the 2nd toe at the level of the midfoot, No crepitus, or limitation noted with foot and ankle range of motion bilateral. Muscular strength 5/5 in all groups tested bilateral.  Assessment and Plan: Problem List Items Addressed This Visit    None    Visit Diagnoses    Capsulitis of foot, right    -  Primary    Tendonitis of foot        Localized edema        dorsal right foot, improving    Pain           -Complete examination performed -Discussed long term care; Advised patient to continue ith volteran topical and ice as needed. If begins to fare back up to use CAM walker and come in for re-evaluation. -Dispensed anklet to give support and compression/prevent edema to use daily as needed on right foot -Patient to return to office as needed or sooner if condition  worsens.  Landis Martins, DPM

## 2015-02-17 DIAGNOSIS — J209 Acute bronchitis, unspecified: Secondary | ICD-10-CM | POA: Diagnosis not present

## 2015-04-08 DIAGNOSIS — R635 Abnormal weight gain: Secondary | ICD-10-CM | POA: Diagnosis not present

## 2015-04-08 DIAGNOSIS — Z209 Contact with and (suspected) exposure to unspecified communicable disease: Secondary | ICD-10-CM | POA: Diagnosis not present

## 2015-07-02 DIAGNOSIS — J069 Acute upper respiratory infection, unspecified: Secondary | ICD-10-CM | POA: Diagnosis not present

## 2015-07-08 ENCOUNTER — Other Ambulatory Visit: Payer: Self-pay

## 2015-07-08 DIAGNOSIS — Z1231 Encounter for screening mammogram for malignant neoplasm of breast: Secondary | ICD-10-CM

## 2015-07-17 DIAGNOSIS — R739 Hyperglycemia, unspecified: Secondary | ICD-10-CM | POA: Diagnosis not present

## 2015-07-17 DIAGNOSIS — L989 Disorder of the skin and subcutaneous tissue, unspecified: Secondary | ICD-10-CM | POA: Diagnosis not present

## 2015-07-17 DIAGNOSIS — K219 Gastro-esophageal reflux disease without esophagitis: Secondary | ICD-10-CM | POA: Diagnosis not present

## 2015-07-17 DIAGNOSIS — M79671 Pain in right foot: Secondary | ICD-10-CM | POA: Diagnosis not present

## 2015-07-17 DIAGNOSIS — F324 Major depressive disorder, single episode, in partial remission: Secondary | ICD-10-CM | POA: Diagnosis not present

## 2015-07-17 DIAGNOSIS — M797 Fibromyalgia: Secondary | ICD-10-CM | POA: Diagnosis not present

## 2015-07-17 DIAGNOSIS — E039 Hypothyroidism, unspecified: Secondary | ICD-10-CM | POA: Diagnosis not present

## 2015-07-17 DIAGNOSIS — R002 Palpitations: Secondary | ICD-10-CM | POA: Diagnosis not present

## 2015-07-17 DIAGNOSIS — I10 Essential (primary) hypertension: Secondary | ICD-10-CM | POA: Diagnosis not present

## 2015-07-18 ENCOUNTER — Telehealth: Payer: Self-pay | Admitting: Internal Medicine

## 2015-07-18 NOTE — Telephone Encounter (Signed)
Received records from [place] for appointment on 08/07/15 with Dr Debara Pickett.  Records given to [facility] (medical records) for Dr Lysbeth Penner schedule on 08/07/15 lp

## 2015-07-23 DIAGNOSIS — D225 Melanocytic nevi of trunk: Secondary | ICD-10-CM | POA: Diagnosis not present

## 2015-07-23 DIAGNOSIS — D1801 Hemangioma of skin and subcutaneous tissue: Secondary | ICD-10-CM | POA: Diagnosis not present

## 2015-07-23 DIAGNOSIS — L918 Other hypertrophic disorders of the skin: Secondary | ICD-10-CM | POA: Diagnosis not present

## 2015-07-23 DIAGNOSIS — D2239 Melanocytic nevi of other parts of face: Secondary | ICD-10-CM | POA: Diagnosis not present

## 2015-07-23 DIAGNOSIS — D485 Neoplasm of uncertain behavior of skin: Secondary | ICD-10-CM | POA: Diagnosis not present

## 2015-07-24 ENCOUNTER — Ambulatory Visit
Admission: RE | Admit: 2015-07-24 | Discharge: 2015-07-24 | Disposition: A | Discharge status: Home / Self Care | Payer: Medicare Other | Source: Ambulatory Visit

## 2015-07-24 DIAGNOSIS — M25571 Pain in right ankle and joints of right foot: Secondary | ICD-10-CM | POA: Diagnosis not present

## 2015-07-24 DIAGNOSIS — M25572 Pain in left ankle and joints of left foot: Secondary | ICD-10-CM | POA: Diagnosis not present

## 2015-07-24 DIAGNOSIS — Z1231 Encounter for screening mammogram for malignant neoplasm of breast: Secondary | ICD-10-CM | POA: Diagnosis not present

## 2015-07-30 DIAGNOSIS — M722 Plantar fascial fibromatosis: Secondary | ICD-10-CM | POA: Diagnosis not present

## 2015-07-30 DIAGNOSIS — M25571 Pain in right ankle and joints of right foot: Secondary | ICD-10-CM | POA: Diagnosis not present

## 2015-07-30 DIAGNOSIS — M25572 Pain in left ankle and joints of left foot: Secondary | ICD-10-CM | POA: Diagnosis not present

## 2015-08-06 DIAGNOSIS — M25571 Pain in right ankle and joints of right foot: Secondary | ICD-10-CM | POA: Diagnosis not present

## 2015-08-06 DIAGNOSIS — M722 Plantar fascial fibromatosis: Secondary | ICD-10-CM | POA: Diagnosis not present

## 2015-08-06 DIAGNOSIS — I493 Ventricular premature depolarization: Secondary | ICD-10-CM | POA: Insufficient documentation

## 2015-08-06 DIAGNOSIS — R739 Hyperglycemia, unspecified: Secondary | ICD-10-CM | POA: Insufficient documentation

## 2015-08-06 DIAGNOSIS — M25572 Pain in left ankle and joints of left foot: Secondary | ICD-10-CM | POA: Diagnosis not present

## 2015-08-07 ENCOUNTER — Encounter: Payer: Self-pay | Admitting: Internal Medicine

## 2015-08-07 ENCOUNTER — Ambulatory Visit (INDEPENDENT_AMBULATORY_CARE_PROVIDER_SITE_OTHER): Payer: Medicare Other | Admitting: Internal Medicine

## 2015-08-07 ENCOUNTER — Encounter (INDEPENDENT_AMBULATORY_CARE_PROVIDER_SITE_OTHER): Payer: Medicare Other

## 2015-08-07 VITALS — BP 130/96 | HR 66 | Ht 65.0 in | Wt 216.5 lb

## 2015-08-07 DIAGNOSIS — I493 Ventricular premature depolarization: Secondary | ICD-10-CM

## 2015-08-07 DIAGNOSIS — I10 Essential (primary) hypertension: Secondary | ICD-10-CM

## 2015-08-07 DIAGNOSIS — I341 Nonrheumatic mitral (valve) prolapse: Secondary | ICD-10-CM

## 2015-08-07 DIAGNOSIS — R0602 Shortness of breath: Secondary | ICD-10-CM

## 2015-08-07 DIAGNOSIS — R0609 Other forms of dyspnea: Secondary | ICD-10-CM

## 2015-08-07 DIAGNOSIS — M797 Fibromyalgia: Secondary | ICD-10-CM

## 2015-08-07 DIAGNOSIS — R002 Palpitations: Secondary | ICD-10-CM

## 2015-08-07 NOTE — Progress Notes (Signed)
OFFICE NOTE  Chief Complaint:  Palpitations, shortness of breath  Primary Care Physician: Glenda Frees, MD  HPI:  Glenda Evans is a 60 y.o. female with a past medical history significant for some anxiety, fibromyalgia, hypertension, palpitations and hypothyroidism. She initially had evaluation of her palpitations in the early 2000's by Glenda Evans. At the time she wore monitor, had a stress test and echocardiogram. The echocardiogram suggested mitral valve prolapse. Her monitor failed to show clear etiology of her symptoms. She does have a history of PVCs on her chart however those were not noted by EKG today. Recently she said she's had some increasing burden of palpitations. She denies any worsening stress. She also has sharp chest discomfort. She feels like this is different than her fibromyalgia, however it was reproduced during auscultation with my stethoscope today. She's had close to 20 pound weight gain over the past several months due to some orthopedic problems in her feet which she says is decreased her exercise. This certainly could explain some of her shortness of breath. She feels like the episodes of palpitations that occur very infrequently are paroxysmal. Oftentimes include heart racing and then can be followed by feelings of significant fatigue which is worse than her daily chronic fatigue.  PMHx:  Past Medical History  Diagnosis Date  . Fibromyalgia   . Mitral valve prolapse   . Hypothyroidism   . Depression   . IBS (irritable bowel syndrome)   . Asthma   . Migraine   . GERD (gastroesophageal reflux disease)   . Stein-Leventhal syndrome   . Fatigue   . Arthritis   . Wears glasses   . Anxiety   . Diverticulosis   . HLD (hyperlipidemia)   . PVC (premature ventricular contraction)   . Hyperglycemia     Past Surgical History  Procedure Laterality Date  . Cesarean section      x 2  . Tonsillectomy    . Abdominal hysterectomy    . Cholecystectomy      . Lipoma excision  11/26/10    thigh x3 cyst    FAMHx:  Family History  Problem Relation Age of Onset  . COPD Father   . Hypertension Father   . Diabetes Sister   . Breast cancer Maternal Grandmother   . Ovarian cancer Paternal Aunt   . Colon polyps Mother   . Colon polyps Sister   . Colon polyps Father   . Colon polyps Maternal Aunt     x 2  . Colon polyps Maternal Grandmother   . Diabetes Maternal Grandmother   . Diabetes Maternal Aunt   . Heart disease Maternal Grandmother   . Heart disease Sister     MVP  . Heart disease Mother     MVP  . COPD Maternal Grandmother   . COPD Maternal Aunt     SOCHx:   reports that she has never smoked. She has never used smokeless tobacco. She reports that she does not drink alcohol or use illicit drugs.  ALLERGIES:  Allergies  Allergen Reactions  . Augmentin [Amoxicillin-Pot Clavulanate]   . Cymbalta [Duloxetine Hcl]   . Erythromycin   . Escitalopram Oxalate   . Lyrica [Pregabalin]   . Macrobid [Nitrofurantoin Macrocrystal]   . Neurontin [Gabapentin]   . Ofloxacin   . Pamelor   . Relafen [Nabumetone]   . Septra [Bactrim]   . Skelaxin   . Sulfa Antibiotics     ROS: Pertinent items noted in HPI  and remainder of comprehensive ROS otherwise negative.  HOME MEDS: Current Outpatient Prescriptions  Medication Sig Dispense Refill  . albuterol (PROVENTIL HFA;VENTOLIN HFA) 108 (90 BASE) MCG/ACT inhaler Inhale 1 puff into the lungs every 6 (six) hours as needed for wheezing or shortness of breath.    . citalopram (CELEXA) 20 MG tablet Take 20 mg by mouth daily.      . clonazePAM (KLONOPIN) 1 MG tablet Take by mouth at bedtime as needed.  5  . DULoxetine HCl (CYMBALTA PO) Take by mouth daily.    Marland Kitchen estradiol (ESTRACE) 1 MG tablet TAKE 1 TABLET ONCE A DAY BY MOUTH 90 DAYS  1  . levothyroxine (SYNTHROID, LEVOTHROID) 100 MCG tablet Take 100 mcg by mouth every morning. On an empty stomach  0  . losartan-hydrochlorothiazide (HYZAAR)  100-25 MG per tablet Take 0.5 tablets by mouth daily.     . Multiple Vitamin (MULTIVITAMIN) tablet Take 1 tablet by mouth daily.    Marland Kitchen NEXIUM 40 MG capsule Take 40 mg by mouth daily.  0  . verapamil (CALAN-SR) 240 MG CR tablet Take 240 mg by mouth every morning.  0  . Vitamin D, Ergocalciferol, (DRISDOL) 50000 UNITS CAPS capsule Take 50,000 Units by mouth every 7 (seven) days.     Current Facility-Administered Medications  Medication Dose Route Frequency Provider Last Rate Last Dose  . triamcinolone acetonide (KENALOG) 10 MG/ML injection 10 mg  10 mg Other Once Landis Martins, DPM        LABS/IMAGING: No results found for this or any previous visit (from the past 48 hour(s)). No results found.  WEIGHTS: Wt Readings from Last 3 Encounters:  08/07/15 216 lb 8 oz (98.204 kg)  06/30/14 196 lb (88.905 kg)  07/03/13 208 lb 9.6 oz (94.62 kg)    VITALS: BP 130/96 mmHg  Pulse 66  Ht 5\' 5"  (1.651 m)  Wt 216 lb 8 oz (98.204 kg)  BMI 36.03 kg/m2  EXAM: General appearance: alert and no distress Neck: no carotid bruit, no JVD and thyroid not enlarged, symmetric, no tenderness/mass/nodules Lungs: clear to auscultation bilaterally Heart: regular rate and rhythm, S1, S2 normal and systolic murmur: early systolic 2/6, blowing throughout the precordium Abdomen: soft, non-tender; bowel sounds normal; no masses,  no organomegaly Extremities: extremities normal, atraumatic, no cyanosis or edema Pulses: 2+ and symmetric Skin: Skin color, texture, turgor normal. No rashes or lesions Neurologic: Grossly normal Psych: Pleasant  EKG: Normal sinus rhythm at 66  ASSESSMENT: 1. Palpitations 2. Dyspnea on exertion 3. Recent weight gain 4. Essential hypertension 5. Atypical chest wall pain-likely related to fibromyalgia 6. Anxiety  PLAN: 1.   Glenda Evans has recently had an increase in her palpitations. I like her to wear a 30 day monitor to see if we can pick up on what these are. She is taking  verapamil which is been on for number of years however recent increase in the dose did not seem to make a difference. We'll also check an echocardiogram because of her shortness of breath and to reevaluate her mitral valve. She may not have diagnostic valve prolapse given the older date of diagnosis. Our criteria for diagnosing mitral valve prolapse have changed significantly. As she understands, endocarditis prophylaxis is no longer necessary. Plan to see her back to discuss results of her monitor and echocardiogram in a few weeks. We'll see if we can find the report of her previous echocardiogram with [facility] cardiology a number of years ago.  Thanks for the kind  referral.  Pixie Casino, MD, [facility] Attending Cardiologist [place] 08/07/2015, 1:05 PM

## 2015-08-07 NOTE — Patient Instructions (Signed)
Your physician has requested that you have an echocardiogram @ [address]. Echocardiography is a painless test that uses sound waves to create images of your heart. It provides your doctor with information about the size and shape of your heart and how well your heart's chambers and valves are working. This procedure takes approximately one hour. There are no restrictions for this procedure.  Your physician has recommended that you wear an event monitor for 30 days. Event monitors are medical devices that record the heart's electrical activity. Doctors most often [place] these monitors to diagnose arrhythmias. Arrhythmias are problems with the speed or rhythm of the heartbeat. The monitor is a small, portable device. You can wear one while you do your normal daily activities. This is usually used to diagnose what is causing palpitations/syncope (passing out).  Your physician recommends that you schedule a follow-up appointment after your testing.

## 2015-08-11 ENCOUNTER — Telehealth: Payer: Self-pay | Admitting: Internal Medicine

## 2015-08-11 NOTE — Telephone Encounter (Signed)
Returned call to patient. Advised her to call [employer] to request sensitive skin electrodes. She agreed with the plan. She will ask what she should do with the monitor as far as if she should place on hold until she gets the new electrodes to try. She is aware the contact information is in on the front page of the pamphlet.

## 2015-08-11 NOTE — Telephone Encounter (Signed)
Pt called in stating that she would be returning the heart monitor tomorrow due to the electrodes causing blisters on her skin. Please f/u with her  Thanks

## 2015-08-13 DIAGNOSIS — M25571 Pain in right ankle and joints of right foot: Secondary | ICD-10-CM | POA: Diagnosis not present

## 2015-08-13 DIAGNOSIS — M722 Plantar fascial fibromatosis: Secondary | ICD-10-CM | POA: Diagnosis not present

## 2015-08-13 DIAGNOSIS — M25572 Pain in left ankle and joints of left foot: Secondary | ICD-10-CM | POA: Diagnosis not present

## 2015-08-15 DIAGNOSIS — M25571 Pain in right ankle and joints of right foot: Secondary | ICD-10-CM | POA: Diagnosis not present

## 2015-08-15 DIAGNOSIS — M722 Plantar fascial fibromatosis: Secondary | ICD-10-CM | POA: Diagnosis not present

## 2015-08-15 DIAGNOSIS — M25572 Pain in left ankle and joints of left foot: Secondary | ICD-10-CM | POA: Diagnosis not present

## 2015-08-18 DIAGNOSIS — J0100 Acute maxillary sinusitis, unspecified: Secondary | ICD-10-CM | POA: Diagnosis not present

## 2015-08-20 DIAGNOSIS — M25572 Pain in left ankle and joints of left foot: Secondary | ICD-10-CM | POA: Diagnosis not present

## 2015-08-20 DIAGNOSIS — M722 Plantar fascial fibromatosis: Secondary | ICD-10-CM | POA: Diagnosis not present

## 2015-08-20 DIAGNOSIS — M25571 Pain in right ankle and joints of right foot: Secondary | ICD-10-CM | POA: Diagnosis not present

## 2015-08-21 ENCOUNTER — Other Ambulatory Visit: Payer: Self-pay

## 2015-08-21 ENCOUNTER — Ambulatory Visit (HOSPITAL_COMMUNITY): Payer: Medicare Other | Attending: Cardiology

## 2015-08-21 DIAGNOSIS — M797 Fibromyalgia: Secondary | ICD-10-CM | POA: Insufficient documentation

## 2015-08-21 DIAGNOSIS — R0602 Shortness of breath: Secondary | ICD-10-CM | POA: Diagnosis not present

## 2015-08-21 DIAGNOSIS — R002 Palpitations: Secondary | ICD-10-CM | POA: Diagnosis not present

## 2015-08-21 DIAGNOSIS — I341 Nonrheumatic mitral (valve) prolapse: Secondary | ICD-10-CM | POA: Insufficient documentation

## 2015-08-21 DIAGNOSIS — R0600 Dyspnea, unspecified: Secondary | ICD-10-CM | POA: Diagnosis present

## 2015-08-21 DIAGNOSIS — E785 Hyperlipidemia, unspecified: Secondary | ICD-10-CM | POA: Diagnosis not present

## 2015-08-22 DIAGNOSIS — M25572 Pain in left ankle and joints of left foot: Secondary | ICD-10-CM | POA: Diagnosis not present

## 2015-08-22 DIAGNOSIS — M722 Plantar fascial fibromatosis: Secondary | ICD-10-CM | POA: Diagnosis not present

## 2015-08-22 DIAGNOSIS — M25571 Pain in right ankle and joints of right foot: Secondary | ICD-10-CM | POA: Diagnosis not present

## 2015-08-25 DIAGNOSIS — R002 Palpitations: Secondary | ICD-10-CM | POA: Diagnosis not present

## 2015-08-27 ENCOUNTER — Ambulatory Visit: Payer: Medicare Other | Admitting: Gastroenterology

## 2015-08-27 ENCOUNTER — Telehealth: Payer: Self-pay | Admitting: Gastroenterology

## 2015-08-27 NOTE — Telephone Encounter (Signed)
No charge this time per Dr. Fuller Plan

## 2015-08-28 DIAGNOSIS — M79672 Pain in left foot: Secondary | ICD-10-CM | POA: Diagnosis not present

## 2015-08-28 DIAGNOSIS — M79671 Pain in right foot: Secondary | ICD-10-CM | POA: Diagnosis not present

## 2015-09-03 ENCOUNTER — Encounter: Payer: Self-pay | Admitting: Internal Medicine

## 2015-09-03 DIAGNOSIS — M722 Plantar fascial fibromatosis: Secondary | ICD-10-CM | POA: Diagnosis not present

## 2015-09-03 DIAGNOSIS — M25571 Pain in right ankle and joints of right foot: Secondary | ICD-10-CM | POA: Diagnosis not present

## 2015-09-03 DIAGNOSIS — M25572 Pain in left ankle and joints of left foot: Secondary | ICD-10-CM | POA: Diagnosis not present

## 2015-09-04 DIAGNOSIS — M25571 Pain in right ankle and joints of right foot: Secondary | ICD-10-CM | POA: Diagnosis not present

## 2015-09-10 DIAGNOSIS — M25571 Pain in right ankle and joints of right foot: Secondary | ICD-10-CM | POA: Diagnosis not present

## 2015-09-10 DIAGNOSIS — M25572 Pain in left ankle and joints of left foot: Secondary | ICD-10-CM | POA: Diagnosis not present

## 2015-09-17 ENCOUNTER — Ambulatory Visit (INDEPENDENT_AMBULATORY_CARE_PROVIDER_SITE_OTHER): Payer: Medicare Other | Admitting: Internal Medicine

## 2015-09-17 ENCOUNTER — Encounter: Payer: Self-pay | Admitting: Internal Medicine

## 2015-09-17 VITALS — BP 116/80 | HR 80 | Ht 65.0 in | Wt 215.0 lb

## 2015-09-17 DIAGNOSIS — I493 Ventricular premature depolarization: Secondary | ICD-10-CM

## 2015-09-17 DIAGNOSIS — R002 Palpitations: Secondary | ICD-10-CM | POA: Diagnosis not present

## 2015-09-17 DIAGNOSIS — I10 Essential (primary) hypertension: Secondary | ICD-10-CM

## 2015-09-17 MED ORDER — DILTIAZEM HCL ER COATED BEADS 240 MG PO CP24: 240.0000 mg | ORAL_CAPSULE | Freq: Every day | ORAL | Status: DC

## 2015-09-17 MED ORDER — LOSARTAN POTASSIUM-HCTZ 100-25 MG PO TABS: 1.0000 | ORAL_TABLET | Freq: Every day | ORAL | Status: DC

## 2015-09-17 NOTE — Progress Notes (Signed)
OFFICE NOTE  Chief Complaint:  Palpitations, follow-up monitor  Primary Care Physician: Shirline Frees, MD  HPI:  Glenda Evans is a 60 y.o. female with a past medical history significant for some anxiety, fibromyalgia, hypertension, palpitations and hypothyroidism. She initially had evaluation of her palpitations in the early 2000's by Dr. Pernell Dupre. At the time she wore monitor, had a stress test and echocardiogram. The echocardiogram suggested mitral valve prolapse. Her monitor failed to show clear etiology of her symptoms. She does have a history of PVCs on her chart however those were not noted by EKG today. Recently she said she's had some increasing burden of palpitations. She denies any worsening stress. She also has sharp chest discomfort. She feels like this is different than her fibromyalgia, however it was reproduced during auscultation with my stethoscope today. She's had close to 20 pound weight gain over the past several months due to some orthopedic problems in her feet which she says is decreased her exercise. This certainly could explain some of her shortness of breath. She feels like the episodes of palpitations that occur very infrequently are paroxysmal. Oftentimes include heart racing and then can be followed by feelings of significant fatigue which is worse than her daily chronic fatigue.  09/17/2015  Glenda Evans returns today for follow-up. She wore a monitor which demonstrated PACs and has had some PVCs. Her echocardiogram showed normal systolic function with mild diastolic dysfunction. There was no evidence of mitral valve prolapse that was diagnostically significant. We talked about possible medication changes to try to help with her palpitations. She does note that she has significant side effects from verapamil including possible constipation.   PMHx:  Past Medical History  Diagnosis Date  . Fibromyalgia   . Mitral valve prolapse   . Hypothyroidism   .  Depression   . IBS (irritable bowel syndrome)   . Asthma   . Migraine   . GERD (gastroesophageal reflux disease)   . Stein-Leventhal syndrome   . Fatigue   . Arthritis   . Wears glasses   . Anxiety   . Diverticulosis   . HLD (hyperlipidemia)   . PVC (premature ventricular contraction)   . Hyperglycemia     Past Surgical History  Procedure Laterality Date  . Cesarean section      x 2  . Tonsillectomy    . Abdominal hysterectomy    . Cholecystectomy    . Lipoma excision  11/26/10    thigh x3 cyst    FAMHx:  Family History  Problem Relation Age of Onset  . COPD Father   . Hypertension Father   . Diabetes Sister   . Breast cancer Maternal Grandmother   . Ovarian cancer Paternal Aunt   . Colon polyps Mother   . Colon polyps Sister   . Colon polyps Father   . Colon polyps Maternal Aunt     x 2  . Colon polyps Maternal Grandmother   . Diabetes Maternal Grandmother   . Diabetes Maternal Aunt   . Heart disease Maternal Grandmother   . Heart disease Sister     MVP  . Heart disease Mother     MVP  . COPD Maternal Grandmother   . COPD Maternal Aunt     SOCHx:   reports that she has never smoked. She has never used smokeless tobacco. She reports that she does not drink alcohol or use illicit drugs.  ALLERGIES:  Allergies  Allergen Reactions  . Augmentin [Amoxicillin-Pot  Clavulanate]   . Cymbalta [Duloxetine Hcl]   . Erythromycin   . Escitalopram Oxalate   . Lyrica [Pregabalin]   . Macrobid [Nitrofurantoin Macrocrystal]   . Neurontin [Gabapentin]   . Ofloxacin   . Pamelor   . Relafen [Nabumetone]   . Septra [Bactrim]   . Skelaxin   . Sulfa Antibiotics     ROS: Pertinent items noted in HPI and remainder of comprehensive ROS otherwise negative.  HOME MEDS: Current Outpatient Prescriptions  Medication Sig Dispense Refill  . albuterol (PROVENTIL HFA;VENTOLIN HFA) 108 (90 BASE) MCG/ACT inhaler Inhale 1 puff into the lungs every 6 (six) hours as needed for  wheezing or shortness of breath.    . clonazePAM (KLONOPIN) 1 MG tablet Take by mouth at bedtime as needed.  5  . DULoxetine HCl (CYMBALTA PO) Take by mouth daily.    Marland Kitchen estradiol (ESTRACE) 1 MG tablet TAKE 1 TABLET ONCE A DAY BY MOUTH 90 DAYS  1  . levothyroxine (SYNTHROID, LEVOTHROID) 100 MCG tablet Take 100 mcg by mouth every morning. On an empty stomach  0  . losartan-hydrochlorothiazide (HYZAAR) 100-25 MG tablet Take 1 tablet by mouth daily. 30 tablet 5  . Multiple Vitamin (MULTIVITAMIN) tablet Take 1 tablet by mouth daily.    Marland Kitchen NEXIUM 40 MG capsule Take 40 mg by mouth daily.  0  . Vitamin D, Ergocalciferol, (DRISDOL) 50000 UNITS CAPS capsule Take 50,000 Units by mouth every 7 (seven) days.    Marland Kitchen diltiazem (CARDIZEM CD) 240 MG 24 hr capsule Take 1 capsule (240 mg total) by mouth daily. 30 capsule 5   Current Facility-Administered Medications  Medication Dose Route Frequency Provider Last Rate Last Dose  . triamcinolone acetonide (KENALOG) 10 MG/ML injection 10 mg  10 mg Other Once Landis Martins, DPM        LABS/IMAGING: No results found for this or any previous visit (from the past 48 hour(s)). No results found.  WEIGHTS: Wt Readings from Last 3 Encounters:  09/17/15 215 lb (97.523 kg)  08/07/15 216 lb 8 oz (98.204 kg)  06/30/14 196 lb (88.905 kg)    VITALS: BP 116/80 mmHg  Pulse 80  Ht 5\' 5"  (1.651 m)  Wt 215 lb (97.523 kg)  BMI 35.78 kg/m2  EXAM: Deferred  EKG: Deferred  ASSESSMENT: 1. Palpitations - PAC's and PVC's 2. Dyspnea on exertion - normal systolic function and mild diastolic dysfunction 3. Essential hypertension 4. Atypical chest wall pain-likely related to fibromyalgia 5. Anxiety  PLAN: 1.   Glenda Evans has had more palpitations recently. Her monitor shows PACs and PVCs. There is no evidence for atrial fibrillation. She may benefit from the addition of beta blocker however we discussed that today including possible side effects and she is very  concerned about worsening depression and fatigue. She does have chronic fatigue and depression a ready that she struggles with. She did seem to be interested in possibly changing her calcium channel blocker. Because of her constipation we may be able to lessen that and get a little better palpitation control with a dihydropyridine. I would recommend switching her from verapamil over to diltiazem 240 mg daily. As this is not as potent of a blood pressure medication, I would advise she increase her Hyzaar to a full tablet, which is 100/25 mg daily. We will have her follow-up in her hypertension clinic for blood pressure check in one month and follow-up with me in 3 months.  Glenda Casino, MD, [facility] Attending Cardiologist [place]  Glenda Evans 09/17/2015, 1:02 PM

## 2015-09-17 NOTE — Patient Instructions (Signed)
Your physician recommends that you schedule a follow-up appointment in [place] with clinical pharmacist for a blood pressure check   Your physician recommends that you schedule a follow-up appointment in: [place] with Dr. Debara Pickett   Your physician has recommended you make the following change in your medication...  1. STOP verapamil  2. START diltiazem 240mg  once daily 3. INCREASE losartan-hctz to one whole tablet daily

## 2015-09-29 DIAGNOSIS — J069 Acute upper respiratory infection, unspecified: Secondary | ICD-10-CM | POA: Diagnosis not present

## 2015-09-29 DIAGNOSIS — J209 Acute bronchitis, unspecified: Secondary | ICD-10-CM | POA: Diagnosis not present

## 2015-10-02 DIAGNOSIS — M25572 Pain in left ankle and joints of left foot: Secondary | ICD-10-CM | POA: Diagnosis not present

## 2015-10-02 DIAGNOSIS — M722 Plantar fascial fibromatosis: Secondary | ICD-10-CM | POA: Diagnosis not present

## 2015-10-02 DIAGNOSIS — M25571 Pain in right ankle and joints of right foot: Secondary | ICD-10-CM | POA: Diagnosis not present

## 2015-10-15 DIAGNOSIS — M25571 Pain in right ankle and joints of right foot: Secondary | ICD-10-CM | POA: Diagnosis not present

## 2015-10-15 DIAGNOSIS — M722 Plantar fascial fibromatosis: Secondary | ICD-10-CM | POA: Diagnosis not present

## 2015-10-15 DIAGNOSIS — M25572 Pain in left ankle and joints of left foot: Secondary | ICD-10-CM | POA: Diagnosis not present

## 2015-10-16 ENCOUNTER — Ambulatory Visit (INDEPENDENT_AMBULATORY_CARE_PROVIDER_SITE_OTHER): Payer: Medicare Other | Admitting: Gastroenterology

## 2015-10-16 ENCOUNTER — Encounter: Payer: Self-pay | Admitting: Gastroenterology

## 2015-10-16 VITALS — BP 104/76 | HR 72 | Ht 65.0 in | Wt 214.0 lb

## 2015-10-16 DIAGNOSIS — R1314 Dysphagia, pharyngoesophageal phase: Secondary | ICD-10-CM | POA: Diagnosis not present

## 2015-10-16 DIAGNOSIS — K219 Gastro-esophageal reflux disease without esophagitis: Secondary | ICD-10-CM | POA: Diagnosis not present

## 2015-10-16 DIAGNOSIS — R0689 Other abnormalities of breathing: Secondary | ICD-10-CM | POA: Diagnosis not present

## 2015-10-16 DIAGNOSIS — R0989 Other specified symptoms and signs involving the circulatory and respiratory systems: Secondary | ICD-10-CM

## 2015-10-16 NOTE — Patient Instructions (Signed)
You have been scheduled for a modified barium swallow on 10/22/15 at 11:00am. Please arrive 15 minutes prior to your test for registration. You will go to [facility] Radiology (1st Floor) for your appointment. Please refrain from eating or drinking anything 4 hours prior to your test. Should you need to cancel or reschedule your appointment, please contact [phone] [facility]) or [phone] [place]). _____________________________________________________________________ A Modified Barium Swallow Study, or MBS, is a special x-ray that is taken to check swallowing skills. It is carried out by a Stage manager and a Psychologist, clinical (SLP). During this test, yourmouth, throat, and esophagus, a muscular tube which connects your mouth to your stomach, is checked. The test will help you, your doctor, and the SLP plan what types of foods and liquids are easier for you to swallow. The SLP will also identify positions and ways to help you swallow more easily and safely. What will happen during an MBS? You will be taken to an x-ray room and seated comfortably. You will be asked to swallow small amounts of food and liquid mixed with barium. Barium is a liquid or paste that allows images of your mouth, throat and esophagus to be seen on x-ray. The x-ray captures moving images of the food you are swallowing as it travels from your mouth through your throat and into your esophagus. This test helps identify whether food or liquid is entering your lungs (aspiration). The test also shows which part of your mouth or throat lacks strength or coordination to move the food or liquid in the right direction. This test typically takes 30 minutes to 1 hour to complete. _______________________________________________________________________  Glenda Evans have been scheduled for an endoscopy. Please follow written instructions given to you at your visit today. If you use inhalers (even only as needed), please bring them with  you on the day of your procedure. Your physician has requested that you go to www.startemmi.com and enter the access code given to you at your visit today. This web site gives a general overview about your procedure. However, you should still follow specific instructions given to you by our office regarding your preparation for the procedure.  Patient advised to avoid spicy, acidic, citrus, chocolate, mints, fruit and fruit juices.  Limit the intake of caffeine, alcohol and Soda.  Don't exercise too soon after eating.  Don't lie down within 3-4 hours of eating.  Elevate the head of your bed.  Normal BMI (Body Mass Index- based on height and weight) is between 19 and 25. Your BMI today is Body mass index is 35.61 kg/(m^2). Marland Kitchen Please consider follow up  regarding your BMI with your Primary Care Provider.  Thank you for choosing me and [facility].  [name]. [name]., MD., [facility]

## 2015-10-16 NOTE — Progress Notes (Signed)
    History of Present Illness: This is a 60 year old female self referred for the evaluation of GERD. She is accompanied by her fianc. She has had about a 20 year history of reflux and has been on medications almost on a daily basis since that time was recently she's been treated with Nexium 40 mg daily with good control of reflux symptoms however she misses a dose her symptoms promptly returned. She has had problems with frequent episodes of choking and difficulty swallowing this happens with all consistencies of foods including liquid soft foods meats and breads. Recurrent coughing and choking is her main complaint. Symptoms have been present for many months. They do not necessarily improve when her reflux is well controlled. EGD in 1999 showed a small hiatal hernia and nonerosive antral gastritis and grade 1 distal esophagitis.   Review of Systems: Pertinent positive and negative review of systems were noted in the above HPI section. All other review of systems were otherwise negative.  Current Medications, Allergies, Past Medical History, Past Surgical History, Family History and Social History were reviewed in [employer] record.  Physical Exam: General: Well developed, well nourished, no acute distress Head: Normocephalic and atraumatic Eyes:  sclerae anicteric, EOMI Ears: Normal auditory acuity Mouth: No deformity or lesions Neck: Supple, no masses or thyromegaly Lungs: Clear throughout to auscultation Heart: Regular rate and rhythm; no murmurs, rubs or bruits Abdomen: Soft, non tender and non distended. No masses, hepatosplenomegaly or hernias noted. Normal Bowel sounds Musculoskeletal: Symmetrical with no gross deformities  Skin: No lesions on visible extremities Pulses:  Normal pulses noted Extremities: No clubbing, cyanosis, edema or deformities noted Neurological: Alert oriented x 4, grossly nonfocal Cervical Nodes:  No significant cervical  adenopathy Inguinal Nodes: No significant inguinal adenopathy Psychological:  Alert and cooperative. Anxious.   Assessment and Recommendations:  1. GERD with choking, coughing and dysphagia. Schedule modified barium swallow study and barium esophagram. Following that schedule EGD. Continue Nexium 40 mg daily and follow standard antireflux measures. The risks (including bleeding, perforation, infection, missed lesions, medication reactions and possible hospitalization or surgery if complications occur), benefits, and alternatives to endoscopy with possible biopsy and possible dilation were discussed with the patient and they consent to proceed.   2. History of IBS. Symptoms currently inactive.  3. Family history of colon polyps in father, mother and grandparent. Five-year interval screening colonoscopy is due in November 2018.

## 2015-10-16 NOTE — Addendum Note (Signed)
Addended by: Marzella Schlein on: 10/16/2015 02:38 PM   Modules accepted: Orders

## 2015-10-17 ENCOUNTER — Other Ambulatory Visit (HOSPITAL_COMMUNITY): Payer: Self-pay | Admitting: Gastroenterology

## 2015-10-17 DIAGNOSIS — R1310 Dysphagia, unspecified: Secondary | ICD-10-CM

## 2015-10-22 ENCOUNTER — Ambulatory Visit (HOSPITAL_COMMUNITY)
Admission: RE | Admit: 2015-10-22 | Discharge: 2015-10-22 | Disposition: A | Discharge status: Home / Self Care | Payer: Medicare Other | Source: Ambulatory Visit | Attending: Gastroenterology | Admitting: Gastroenterology

## 2015-10-22 DIAGNOSIS — R0989 Other specified symptoms and signs involving the circulatory and respiratory systems: Secondary | ICD-10-CM | POA: Insufficient documentation

## 2015-10-22 DIAGNOSIS — K219 Gastro-esophageal reflux disease without esophagitis: Secondary | ICD-10-CM | POA: Diagnosis not present

## 2015-10-22 DIAGNOSIS — R1314 Dysphagia, pharyngoesophageal phase: Secondary | ICD-10-CM | POA: Diagnosis not present

## 2015-10-22 DIAGNOSIS — R1310 Dysphagia, unspecified: Secondary | ICD-10-CM | POA: Diagnosis not present

## 2015-10-22 DIAGNOSIS — R0689 Other abnormalities of breathing: Secondary | ICD-10-CM

## 2015-10-22 DIAGNOSIS — K449 Diaphragmatic hernia without obstruction or gangrene: Secondary | ICD-10-CM | POA: Insufficient documentation

## 2015-10-22 DIAGNOSIS — K224 Dyskinesia of esophagus: Secondary | ICD-10-CM | POA: Insufficient documentation

## 2015-10-23 ENCOUNTER — Ambulatory Visit (INDEPENDENT_AMBULATORY_CARE_PROVIDER_SITE_OTHER): Payer: Medicare Other | Admitting: Pharmacist

## 2015-10-23 ENCOUNTER — Ambulatory Visit: Payer: Medicare Other

## 2015-10-23 VITALS — BP 126/94 | Wt 213.6 lb

## 2015-10-23 DIAGNOSIS — I10 Essential (primary) hypertension: Secondary | ICD-10-CM | POA: Diagnosis not present

## 2015-10-23 NOTE — Patient Instructions (Signed)
Return for a a follow up appointment in 1 month  Your blood pressure today is 126/94   Check your blood pressure at home daily (if able) and keep record of the readings.  Take your BP meds as follows: Continue same medications as prescribed  Bring all of your meds, your BP cuff and your record of home blood pressures to your next appointment.  Exercise as you're able, try to walk approximately 30 minutes per day.  Keep salt intake to a minimum, especially watch canned and prepared boxed foods.  Eat more fresh fruits and vegetables and fewer canned items.  Avoid eating in fast food restaurants.    HOW TO TAKE YOUR BLOOD PRESSURE: . Rest 5 minutes before taking your blood pressure. .  Don't smoke or drink caffeinated beverages for at least 30 minutes before. . Take your blood pressure before (not after) you eat. . Sit comfortably with your back supported and both feet on the floor (don't cross your legs). . Elevate your arm to heart level on a table or a desk. . Use the proper sized cuff. It should fit smoothly and snugly around your bare upper arm. There should be enough room to slip a fingertip under the cuff. The bottom edge of the cuff should be 1 inch above the crease of the elbow. . Ideally, take 3 measurements at one sitting and record the average.

## 2015-10-24 ENCOUNTER — Encounter: Payer: Self-pay | Admitting: Pharmacist

## 2015-10-24 NOTE — Progress Notes (Signed)
Patient ID: Glenda Evans                 DOB: 10/20/1955                      MRN: [id number]     HPI: Glenda Evans is a 60 y.o. female patient of Dr. Debara Pickett who presents for hypertension evaluation. She was recently changed from verapamil to diltiazem due to constipation. She reports that the constipation is greatly improved but her blood pressures are elevated. She also reports being very sleepy; so much that she had to move her appt this morning to this afternoon because she could not get out of bed. Of note she also reports reducing the amount of Cymbalta she is taking her self. She also reports using her albuterol inhaler rather frequently recently.   She reports that she currently has a lot of stress in her home life which will hopefully be resolved this evening after a lie-detector test. She believes her blood pressure has been elevated secondary to all this stress.   Dr. Debara Pickett discussed beta blocker therapy for her given her history of PVC but she is very hesitant to take a beta blocker due to fatigue.   Cardiac Hx:  Htn, PVC, asthma, HLD, palpitations, prediabetes  Current HTN meds:  Diltiazem 240mg  daily Losartan-HCTZ 100/25mg  daily  Previously tried:   Verapamil - discontinued due to constipation  BP goal: <140/90  Family History: Mother has mitral valve prolapse Father had hypertension and COPD She has 2 sisters one of which has no cardiac issues the other has an enlarged heart secondary to pulmonary hypertension.   Social History: Denies tobacco and alcohol.   Diet: She admits to poor diet and states that a staple in her diet is ice cream. She reports only eating one meal a day and that meal generally is fast food. She drinks water mostly because she is extremely thirsty all the time. She never adds salt to her food and she denies caffeine intake.   Exercise: She states she plans to be more active now that she has had shots in her ankles and she has gotten new shoes  to help with ankle support. She states she danced last night to 2 songs and then was out of breath. She set a goal to dance or walk for 20 minutes over a week and slowly build to 20 minutes 3 times a week.   Home BP readings: She has a wrist cuff and states her pressures have been elevated recently due to the stress at home though she did not bring a log or the cuff today. She reports systolics in [id number] and diastolics in mid 0000000 and never >100.   Wt Readings from Last 3 Encounters:  10/16/15 214 lb (97.07 kg)  09/17/15 215 lb (97.523 kg)  08/07/15 216 lb 8 oz (98.204 kg)   BP Readings from Last 3 Encounters:  10/16/15 104/76  09/17/15 116/80  08/07/15 130/96   Pulse Readings from Last 3 Encounters:  10/16/15 72  09/17/15 80  08/07/15 66    Renal function: CrCl cannot be calculated (Patient has no serum creatinine result on file.).  Past Medical History  Diagnosis Date  . Fibromyalgia   . Mitral valve prolapse   . Hypothyroidism   . Depression   . IBS (irritable bowel syndrome)   . Asthma   . Migraine   . GERD (gastroesophageal reflux disease)   . Stein-Leventhal syndrome   .  Fatigue   . Arthritis   . Wears glasses   . Anxiety   . HLD (hyperlipidemia)   . PVC (premature ventricular contraction)   . Hyperglycemia     Current Outpatient Prescriptions on File Prior to Visit  Medication Sig Dispense Refill  . albuterol (PROVENTIL HFA;VENTOLIN HFA) 108 (90 BASE) MCG/ACT inhaler Inhale 1 puff into the lungs every 6 (six) hours as needed for wheezing or shortness of breath.    . clonazePAM (KLONOPIN) 1 MG tablet Take by mouth at bedtime as needed.  5  . diltiazem (CARDIZEM CD) 240 MG 24 hr capsule Take 1 capsule (240 mg total) by mouth daily. 30 capsule 5  . DULoxetine HCl (CYMBALTA PO) Take by mouth daily.    Marland Kitchen estradiol (ESTRACE) 1 MG tablet TAKE 1 TABLET ONCE A DAY BY MOUTH 90 DAYS  1  . levothyroxine (SYNTHROID, LEVOTHROID) 100 MCG tablet Take 100 mcg by mouth every  morning. On an empty stomach  0  . losartan-hydrochlorothiazide (HYZAAR) 100-25 MG tablet Take 1 tablet by mouth daily. 30 tablet 5  . Multiple Vitamin (MULTIVITAMIN) tablet Take 1 tablet by mouth daily.    Marland Kitchen NEXIUM 40 MG capsule Take 40 mg by mouth daily.  0  . Vitamin D, Ergocalciferol, (DRISDOL) 50000 UNITS CAPS capsule Take 50,000 Units by mouth every 7 (seven) days.     Current Facility-Administered Medications on File Prior to Visit  Medication Dose Route Frequency Provider Last Rate Last Dose  . triamcinolone acetonide (KENALOG) 10 MG/ML injection 10 mg  10 mg Other Once Landis Martins, DPM        Allergies  Allergen Reactions  . Augmentin [Amoxicillin-Pot Clavulanate]   . Cymbalta [Duloxetine Hcl]   . Erythromycin   . Escitalopram Oxalate   . Lyrica [Pregabalin]   . Macrobid [Nitrofurantoin Macrocrystal]   . Neurontin [Gabapentin]   . Ofloxacin   . Pamelor   . Relafen [Nabumetone]   . Septra [Bactrim]   . Skelaxin   . Sulfa Antibiotics      Assessment/Plan: Hypertension: BP borderline at goal today. Given that she is going through an extremely stressful situation at home will have her continue to monitor daily until she comes back for follow-up in 4 weeks. At that time if pressures remain elevated will consider an additional agent.  Have also instructed her to remain on her prescribed dose of Cymbalta or to contact her primary care about decreasing her dose. I believe her uncontrolled depression may be playing a role in her sleepiness.    Thank you, Lelan Pons. Patterson Hammersmith, [place]

## 2015-10-24 NOTE — Assessment & Plan Note (Signed)
BP borderline at goal today. Given that she is going through an extremely stressful situation at home will have her continue to monitor daily until she comes back for follow-up in 4 weeks. At that time if pressures remain elevated will consider an additional agent.

## 2015-10-28 ENCOUNTER — Encounter: Payer: Self-pay | Admitting: Gastroenterology

## 2015-10-28 ENCOUNTER — Ambulatory Visit (AMBULATORY_SURGERY_CENTER): Payer: Medicare Other | Admitting: Gastroenterology

## 2015-10-28 VITALS — BP 115/80 | HR 68 | Temp 97.5°F | Resp 13 | Ht 65.0 in | Wt 214.0 lb

## 2015-10-28 DIAGNOSIS — J45909 Unspecified asthma, uncomplicated: Secondary | ICD-10-CM | POA: Diagnosis not present

## 2015-10-28 DIAGNOSIS — K219 Gastro-esophageal reflux disease without esophagitis: Secondary | ICD-10-CM

## 2015-10-28 DIAGNOSIS — K2970 Gastritis, unspecified, without bleeding: Secondary | ICD-10-CM

## 2015-10-28 DIAGNOSIS — K2990 Gastroduodenitis, unspecified, without bleeding: Secondary | ICD-10-CM | POA: Diagnosis not present

## 2015-10-28 DIAGNOSIS — R1314 Dysphagia, pharyngoesophageal phase: Secondary | ICD-10-CM

## 2015-10-28 DIAGNOSIS — E039 Hypothyroidism, unspecified: Secondary | ICD-10-CM | POA: Diagnosis not present

## 2015-10-28 DIAGNOSIS — K317 Polyp of stomach and duodenum: Secondary | ICD-10-CM

## 2015-10-28 DIAGNOSIS — I10 Essential (primary) hypertension: Secondary | ICD-10-CM | POA: Diagnosis not present

## 2015-10-28 MED ORDER — SODIUM CHLORIDE 0.9 % IV SOLN: 500.0000 mL | INTRAVENOUS | Status: DC

## 2015-10-28 NOTE — Progress Notes (Signed)
Called to room to assist during endoscopic procedure.  Patient ID and intended procedure confirmed with present staff. Received instructions for my participation in the procedure from the performing physician.  Per Dr. Fuller Plan put gastric body bx and gastric polyps bx in the same bottle was reported to Cori Razor, [place]. maw

## 2015-10-28 NOTE — Op Note (Signed)
[facility] Patient Name: Glenda Evans Procedure Date: 10/28/2015 9:33 AM MRN: [id number] Endoscopist: Ladene Artist , MD Age: 60 Referring MD:  Date of Birth: Feb 18, 1956 Gender: Female Account #: [id number] Procedure:                Upper GI endoscopy Indications:              Dysphagia, Esophageal reflux Medicines:                Monitored Anesthesia Care Procedure:                Pre-Anesthesia Assessment:                           - Prior to the procedure, a History and Physical                            was performed, and patient medications and                            allergies were reviewed. The patient's tolerance of                            previous anesthesia was also reviewed. The risks                            and benefits of the procedure and the sedation                            options and risks were discussed with the patient.                            All questions were answered, and informed consent                            was obtained. Prior Anticoagulants: The patient has                            taken no previous anticoagulant or antiplatelet                            agents. ASA Grade Assessment: II - A patient with                            mild systemic disease. After reviewing the risks                            and benefits, the patient was deemed in                            satisfactory condition to undergo the procedure.                           After obtaining informed consent, the endoscope was  passed under direct vision. Throughout the                            procedure, the patient's blood pressure, pulse, and                            oxygen saturations were monitored continuously. The                            Model GIF-[id number]) scope was introduced                            through the mouth, and advanced to the second part                            of duodenum. The upper  GI endoscopy was                            accomplished without difficulty. The patient                            tolerated the procedure well. Scope In: Scope Out: Findings:                 No endoscopic abnormality was evident in the                            esophagus to explain the patient's complaint of                            dysphagia. It was decided, however, to proceed with                            dilation of the entire esophagus. A guidewire was                            placed and the scope was withdrawn. Dilation was                            performed with a Savary dilator with no resistance                            at 16 mm. Estimated blood loss: none.                           A few 2 to 4 mm sessile polyps with no bleeding and                            no stigmata of recent bleeding were found in the                            stomach. Biopsies were taken with a cold forceps  for histology.                           Diffuse mild inflammation characterized by erythema                            and granularity was found in the entire examined                            stomach. Biopsies were taken with a cold forceps                            for histology.                           The exam of the stomach was otherwise normal.                           The duodenal bulb and second portion of the                            duodenum were normal. Complications:            No immediate complications. Estimated Blood Loss:     Estimated blood loss: none. Impression:               - No endoscopic esophageal abnormality to explain                            patient's dysphagia. Esophagus dilated. Dilated.                           - A few gastric polyps. Biopsied.                           - Gastritis. Biopsied.                           - Normal duodenal bulb and second portion of the                             duodenum. Recommendation:           - Patient has a contact number available for                            emergencies. The signs and symptoms of potential                            delayed complications were discussed with the                            patient. Return to normal activities tomorrow.                            Written discharge instructions were provided to the  patient.                           - Clear liquid diet for 2 hours then advance as                            tolerated to soft diet today. Resume prior diet                            with antireflux measures tomorrow.                           - Continue present medications.                           - Await pathology results. Ladene Artist, MD 10/28/2015 9:54:54 AM This report has been signed electronically.

## 2015-10-28 NOTE — Progress Notes (Signed)
Stable to RR 

## 2015-10-28 NOTE — Patient Instructions (Signed)
YOU HAD AN ENDOSCOPIC PROCEDURE TODAY AT [facility]:   Refer to the procedure report that was given to you for any specific questions about what was found during the examination.  If the procedure report does not answer your questions, please call your gastroenterologist to clarify.  If you requested that your care partner not be given the details of your procedure findings, then the procedure report has been included in a sealed envelope for you to review at your convenience later.  YOU SHOULD EXPECT: Some feelings of bloating in the abdomen. Passage of more gas than usual.  Walking can help get rid of the air that was put into your GI tract during the procedure and reduce the bloating.   Please Note:  You might notice some irritation and congestion in your nose or some drainage.  This is from the oxygen used during your procedure.  There is no need for concern and it should clear up in a day or so.  SYMPTOMS TO REPORT IMMEDIATELY:   Following upper endoscopy (EGD)  Vomiting of blood or coffee ground material  New chest pain or pain under the shoulder blades  Painful or persistently difficult swallowing  New shortness of breath  Fever of 100F or higher  Black, tarry-looking stools  For urgent or emergent issues, a gastroenterologist can be reached at any hour by calling [phone].   DIET: FOLLOW DILATION DIET- SEE HANDOUT  Drink plenty of fluids but you should avoid alcoholic beverages for 24 hours.  ACTIVITY:  You should plan to take it easy for the rest of today and you should NOT DRIVE or use heavy machinery until tomorrow (because of the sedation medicines used during the test).    FOLLOW UP: Our staff will call the number listed on your records the next business day following your procedure to check on you and address any questions or concerns that you may have regarding the information given to you following your procedure. If we do not reach you, we will leave  a message.  However, if you are feeling well and you are not experiencing any problems, there is no need to return our call.  We will assume that you have returned to your regular daily activities without incident.  If any biopsies were taken you will be contacted by phone or by letter within the next 1-3 weeks.  Please call us at [phone] if you have not heard about the biopsies in 3 weeks.    SIGNATURES/CONFIDENTIALITY: You and/or your care partner have signed paperwork which will be entered into your electronic medical record.  These signatures attest to the fact that that the information above on your After Visit Summary has been reviewed and is understood.  Full responsibility of the confidentiality of this discharge information lies with you and/or your care-partner.  Await pathology  Please follow dilation diet today  Please read over handout about gastritis

## 2015-10-29 ENCOUNTER — Telehealth: Payer: Self-pay

## 2015-10-29 NOTE — Telephone Encounter (Signed)
  Follow up Call-  Call back number 10/28/2015  Post procedure Call Back phone  # [phone]  Permission to leave phone message Yes     Patient questions:  Do you have a fever, pain , or abdominal swelling? No. Pain Score  0 *  Have you tolerated food without any problems? Yes.    Have you been able to return to your normal activities? Yes.    Do you have any questions about your discharge instructions: Diet   No. Medications  No. Follow up visit  No.  Do you have questions or concerns about your Care? No.  Actions: * If pain score is 4 or above: No action needed, pain <4.

## 2015-10-30 ENCOUNTER — Encounter: Payer: Self-pay | Admitting: Gastroenterology

## 2015-10-31 ENCOUNTER — Telehealth: Payer: Self-pay | Admitting: Gastroenterology

## 2015-10-31 NOTE — Telephone Encounter (Signed)
Left message for patient to call back  

## 2015-11-03 ENCOUNTER — Telehealth: Payer: Self-pay | Admitting: Gastroenterology

## 2015-11-03 NOTE — Telephone Encounter (Signed)
Dear [name],  I am writing to inform you that the biopsies taken during your recent endoscopic examination showed benign fundic gland polyps and normal stomach lining.    Please call us at Dept: [phone] if you have persistent problems or have questions about your condition that have not been fully answered at this time.  Sincerely,  Ladene Artist, MD The patient has been notified of this information and all questions answered.

## 2015-11-04 NOTE — Telephone Encounter (Signed)
Left message for patient to call back  

## 2015-11-05 ENCOUNTER — Telehealth: Payer: Self-pay | Admitting: Gastroenterology

## 2015-11-05 NOTE — Telephone Encounter (Signed)
Patient notified.  At this time she dose not wish to reschedule.  She will call back if she wants to pursue at a later date.

## 2015-11-05 NOTE — Telephone Encounter (Signed)
It can be rescheduled if she wants to do that. If she wants to have further evaluation of her swallowing problems manometry is the next step.

## 2015-11-05 NOTE — Telephone Encounter (Signed)
No return call from the patient.  A letter was mailed to her home with bx results.  I will await a return call from the patient.

## 2015-11-05 NOTE — Telephone Encounter (Signed)
Patient would like to cancel esophgeal manometry for Monday.  Dr. Fuller Plan she asks how imperative it that she have the manometry?  If you feel very strongly she will reschedule.  She is cancelling for Monday because she has a sore throat.  Please advise

## 2015-11-10 ENCOUNTER — Ambulatory Visit (HOSPITAL_COMMUNITY): Admission: RE | Admit: 2015-11-10 | Payer: Medicare Other | Source: Ambulatory Visit | Admitting: Gastroenterology

## 2015-11-10 ENCOUNTER — Encounter (HOSPITAL_COMMUNITY): Admission: RE | Payer: Self-pay | Source: Ambulatory Visit

## 2015-11-10 SURGERY — MANOMETRY, ESOPHAGUS
Anesthesia: Topical

## 2015-11-20 ENCOUNTER — Ambulatory Visit (INDEPENDENT_AMBULATORY_CARE_PROVIDER_SITE_OTHER): Payer: Medicare Other | Admitting: Pharmacist Clinician (PhC)/ Clinical Pharmacy Specialist

## 2015-11-20 DIAGNOSIS — I10 Essential (primary) hypertension: Secondary | ICD-10-CM

## 2015-11-20 NOTE — Assessment & Plan Note (Signed)
Patient with primary diastolic hypertension, today much improved. Her home readings have still been running on the high end of normal.  Since we have not been able to verify the accuracy of her home cuff, I asked that she bring it with her when coming to see Dr. Debara Pickett later this month.  Until then I praised her getting out and moving more, as well as cutting back on ice cream and choosing healthier evening snacks.  She was encouraged to talk to her PCP about her sleep habits, and continue to improve her diet.

## 2015-11-20 NOTE — Patient Instructions (Signed)
Call if you see you home BP rise to consistently > 140/90  (watch the bottom number more carefully)  Your blood pressure today is 132/74 (goal is <140/90)   Check your blood pressure at home several times each week and keep record of the readings.  Take your BP meds as follows:  Diltiazem 240 mg daily  Losartan HCTZ 100/25 mg daily  Bring all of your meds, your BP cuff and your record of home blood pressures to your next appointment.  Exercise as you're able, try to walk approximately 30 minutes per day.  Keep salt intake to a minimum, especially watch canned and prepared boxed foods.  Eat more fresh fruits and vegetables and fewer canned items.  Avoid eating in fast food restaurants.    HOW TO TAKE YOUR BLOOD PRESSURE: . Rest 5 minutes before taking your blood pressure. .  Don't smoke or drink caffeinated beverages for at least 30 minutes before. . Take your blood pressure before (not after) you eat. . Sit comfortably with your back supported and both feet on the floor (don't cross your legs). . Elevate your arm to heart level on a table or a desk. . Use the proper sized cuff. It should fit smoothly and snugly around your bare upper arm. There should be enough room to slip a fingertip under the cuff. The bottom edge of the cuff should be 1 inch above the crease of the elbow. . Ideally, take 3 measurements at one sitting and record the average.

## 2015-11-20 NOTE — Progress Notes (Signed)
Patient ID: Glenda Evans                 DOB: Jan 27, 1956                      MRN: [id number]     HPI: Glenda Evans is a 60 y.o. female patient of Dr. Debara Pickett who presents for hypertension follow up. She was unable to tolerate verapamil due to constipation and is now doing well with diltiazem.  She reports continued severe drowsiness; she routinely does not get up until 11 am -2 pm, despite going to bed around 10-11pm.  She suffers from fibromyalgia and is pre-diabetic according to her PCP, who watches her A1c closely.    The stress in her life has eased off some over the past few weeks, much of it has been related to family.    Dr. Debara Pickett discussed beta blocker therapy for her given her history of PVC but she is very hesitant to take a beta blocker due to fatigue.   Cardiac Hx:  Htn, PVC, asthma, HLD, palpitations, prediabetes  Current HTN meds:  Diltiazem 240mg  daily Losartan-HCTZ 100/25mg  daily  Previously tried:   Verapamil - discontinued due to constipation  BP goal: <140/90  Family History: Mother has mitral valve prolapse Father had hypertension and COPD She has 2 sisters one of which has no cardiac issues the other has an enlarged heart secondary to pulmonary hypertension.   Social History: Denies tobacco and alcohol.   Diet: She eats only 1 meal per day, usually not at home. Today she had fettucini alfredo with garlic bread for lunch.  She will eat the meal usually shortly after she gets out of bed, then snack later in the afternoon/evening.  Previously she admitted to eating large amounts of ice cream, but states that she has cut back and not had any since her last appointment here.    Exercise: Ankle swelling improved, but still sometimes a problem.  Has been trying to walk more, to the mailbox as well as to her mother's house next door.  Also recently worked to clean up her flower beds.    Home BP readings: She has a wrist cuff and states it has been accurate on her  fiancee.  Average of 16 readings in the past 3 weeks 129-87, with only 1 systolic reading > [id number].  Diastolic readings all [id number].    Wt Readings from Last 3 Encounters:  11/20/15 214 lb 12.8 oz (97.4 kg)  10/28/15 214 lb (97.1 kg)  10/24/15 213 lb 9.6 oz (96.9 kg)   BP Readings from Last 3 Encounters:  11/20/15 134/78  10/28/15 115/80  10/24/15 (!) 126/94   Pulse Readings from Last 3 Encounters:  11/20/15 68  10/28/15 68  10/16/15 72    Renal function: CrCl cannot be calculated (Patient's most recent lab result is older than the maximum 21 days allowed.).  Past Medical History:  Diagnosis Date  . Anxiety   . Arthritis   . Asthma   . Depression   . Fatigue   . Fibromyalgia   . GERD (gastroesophageal reflux disease)   . HLD (hyperlipidemia)   . Hyperglycemia   . Hypothyroidism   . IBS (irritable bowel syndrome)   . Migraine   . Mitral valve prolapse   . PVC (premature ventricular contraction)   . Stein-Leventhal syndrome   . Wears glasses     Current Outpatient Prescriptions on File Prior to Visit  Medication  Sig Dispense Refill  . albuterol (PROVENTIL HFA;VENTOLIN HFA) 108 (90 BASE) MCG/ACT inhaler Inhale 1 puff into the lungs every 6 (six) hours as needed for wheezing or shortness of breath.    . clonazePAM (KLONOPIN) 1 MG tablet Take by mouth at bedtime as needed.  5  . diltiazem (CARDIZEM CD) 240 MG 24 hr capsule Take 1 capsule (240 mg total) by mouth daily. 30 capsule 5  . DULoxetine HCl (CYMBALTA PO) Take by mouth daily.    Marland Kitchen estradiol (ESTRACE) 1 MG tablet TAKE 1 TABLET ONCE A DAY BY MOUTH 90 DAYS  1  . levothyroxine (SYNTHROID, LEVOTHROID) 100 MCG tablet Take 100 mcg by mouth every morning. On an empty stomach  0  . losartan-hydrochlorothiazide (HYZAAR) 100-25 MG tablet Take 1 tablet by mouth daily. 30 tablet 5  . Multiple Vitamin (MULTIVITAMIN) tablet Take 1 tablet by mouth daily.    Marland Kitchen NEXIUM 40 MG capsule Take 40 mg by mouth daily.  0  . Vitamin D,  Ergocalciferol, (DRISDOL) 50000 UNITS CAPS capsule Take 50,000 Units by mouth every 7 (seven) days.     Current Facility-Administered Medications on File Prior to Visit  Medication Dose Route Frequency Provider Last Rate Last Dose  . triamcinolone acetonide (KENALOG) 10 MG/ML injection 10 mg  10 mg Other Once Landis Martins, DPM        Allergies  Allergen Reactions  . Augmentin [Amoxicillin-Pot Clavulanate]   . Cymbalta [Duloxetine Hcl]   . Erythromycin   . Escitalopram Oxalate   . Lyrica [Pregabalin]   . Macrobid [Nitrofurantoin Macrocrystal]   . Neurontin [Gabapentin]   . Ofloxacin   . Pamelor   . Relafen [Nabumetone]   . Septra [Bactrim]   . Skelaxin   . Sulfa Antibiotics      Assessment/Plan: Hypertension: BP borderline at goal today. Given that she is going through an extremely stressful situation at home will have her continue to monitor daily until she comes back for follow-up in 4 weeks. At that time if pressures remain elevated will consider an additional agent.  Have also instructed her to remain on her prescribed dose of Cymbalta or to contact her primary care about decreasing her dose. I believe her uncontrolled depression may be playing a role in her sleepiness.    Thank you, Lelan Pons. Patterson Hammersmith, [place]

## 2015-12-15 DIAGNOSIS — J4520 Mild intermittent asthma, uncomplicated: Secondary | ICD-10-CM | POA: Diagnosis not present

## 2015-12-15 DIAGNOSIS — N951 Menopausal and female climacteric states: Secondary | ICD-10-CM | POA: Diagnosis not present

## 2015-12-15 DIAGNOSIS — Z9189 Other specified personal risk factors, not elsewhere classified: Secondary | ICD-10-CM | POA: Diagnosis not present

## 2015-12-15 DIAGNOSIS — Z7251 High risk heterosexual behavior: Secondary | ICD-10-CM | POA: Diagnosis not present

## 2015-12-17 ENCOUNTER — Ambulatory Visit: Payer: Medicare Other | Admitting: Internal Medicine

## 2015-12-26 ENCOUNTER — Ambulatory Visit (INDEPENDENT_AMBULATORY_CARE_PROVIDER_SITE_OTHER): Payer: Medicare Other | Admitting: Internal Medicine

## 2015-12-26 ENCOUNTER — Encounter: Payer: Self-pay | Admitting: Internal Medicine

## 2015-12-26 VITALS — BP 120/80 | HR 68 | Ht 65.0 in | Wt 211.4 lb

## 2015-12-26 DIAGNOSIS — R5383 Other fatigue: Secondary | ICD-10-CM | POA: Diagnosis not present

## 2015-12-26 DIAGNOSIS — R0683 Snoring: Secondary | ICD-10-CM | POA: Diagnosis not present

## 2015-12-26 DIAGNOSIS — G4719 Other hypersomnia: Secondary | ICD-10-CM

## 2015-12-26 DIAGNOSIS — I10 Essential (primary) hypertension: Secondary | ICD-10-CM

## 2015-12-26 NOTE — Patient Instructions (Signed)
Your physician has recommended that you have a sleep study @ [facility]. This test records several body functions during sleep, including: brain activity, eye movement, oxygen and carbon dioxide blood levels, heart rate and rhythm, breathing rate and rhythm, the flow of air through your mouth and nose, snoring, body muscle movements, and chest and belly movement.  Your physician recommends that you schedule a follow-up appointment in THREE MONTHS with Dr. Hilty  

## 2015-12-26 NOTE — Progress Notes (Signed)
OFFICE NOTE  Chief Complaint:  Follow-up  Primary Care Physician: Shirline Frees, MD  HPI:  Glenda Evans is a 60 y.o. female with a past medical history significant for some anxiety, fibromyalgia, hypertension, palpitations and hypothyroidism. She initially had evaluation of her palpitations in the early 2000's by Dr. Pernell Dupre. At the time she wore monitor, had a stress test and echocardiogram. The echocardiogram suggested mitral valve prolapse. Her monitor failed to show clear etiology of her symptoms. She does have a history of PVCs on her chart however those were not noted by EKG today. Recently she said she's had some increasing burden of palpitations. She denies any worsening stress. She also has sharp chest discomfort. She feels like this is different than her fibromyalgia, however it was reproduced during auscultation with my stethoscope today. She's had close to 20 pound weight gain over the past several months due to some orthopedic problems in her feet which she says is decreased her exercise. This certainly could explain some of her shortness of breath. She feels like the episodes of palpitations that occur very infrequently are paroxysmal. Oftentimes include heart racing and then can be followed by feelings of significant fatigue which is worse than her daily chronic fatigue.  09/17/2015  Glenda Evans returns today for follow-up. She wore a monitor which demonstrated PACs and has had some PVCs. Her echocardiogram showed normal systolic function with mild diastolic dysfunction. There was no evidence of mitral valve prolapse that was diagnostically significant. We talked about possible medication changes to try to help with her palpitations. She does note that she has significant side effects from verapamil including possible constipation.   12/26/2015  Glenda Evans returns today for follow-up. She reports marked improvement in her palpitations with adjustment in her medications.  Blood pressure is excellent today at 120/80. She denies any chest pain or worsening shortness of breath. She continues to be fatigued. Sleep is poor at night and generally she is quite awake. Occasionally she gas for breath but does not report heavy snoring. She naps a lot during the day.  PMHx:  Past Medical History:  Diagnosis Date  . Anxiety   . Arthritis   . Asthma   . Depression   . Fatigue   . Fibromyalgia   . GERD (gastroesophageal reflux disease)   . HLD (hyperlipidemia)   . Hyperglycemia   . Hypothyroidism   . IBS (irritable bowel syndrome)   . Migraine   . Mitral valve prolapse   . PVC (premature ventricular contraction)   . Stein-Leventhal syndrome   . Wears glasses     Past Surgical History:  Procedure Laterality Date  . ABDOMINAL HYSTERECTOMY    . CESAREAN SECTION     x 2  . CHOLECYSTECTOMY    . LIPOMA EXCISION  11/26/10   thigh x3 cyst  . TONSILLECTOMY      FAMHx:  Family History  Problem Relation Age of Onset  . COPD Father   . Hypertension Father   . Colon polyps Father   . Diabetes Sister   . Colon polyps Mother   . Heart disease Mother     MVP  . Breast cancer Maternal Grandmother   . Colon polyps Maternal Grandmother   . Diabetes Maternal Grandmother   . Heart disease Maternal Grandmother   . COPD Maternal Grandmother   . Ovarian cancer Paternal Aunt   . Colon polyps Sister   . Colon polyps Maternal Aunt     x  2  . Diabetes Maternal Aunt   . Heart disease Sister     MVP  . COPD Maternal Aunt     SOCHx:   reports that she has never smoked. She has never used smokeless tobacco. She reports that she does not drink alcohol or use drugs.  ALLERGIES:  Allergies  Allergen Reactions  . Macrobid [Nitrofurantoin Macrocrystal] Swelling  . Augmentin [Amoxicillin-Pot Clavulanate]   . Cymbalta [Duloxetine Hcl]   . Erythromycin   . Escitalopram Oxalate   . Lyrica [Pregabalin]   . Neurontin [Gabapentin]   . Ofloxacin   . Pamelor   . Relafen  [Nabumetone]   . Septra [Bactrim]   . Skelaxin   . Sulfa Antibiotics     ROS: Pertinent items noted in HPI and remainder of comprehensive ROS otherwise negative.  HOME MEDS: Current Outpatient Prescriptions  Medication Sig Dispense Refill  . albuterol (PROVENTIL HFA;VENTOLIN HFA) 108 (90 BASE) MCG/ACT inhaler Inhale 1 puff into the lungs every 6 (six) hours as needed for wheezing or shortness of breath.    . clonazePAM (KLONOPIN) 1 MG tablet Take 1 mg by mouth at bedtime as needed for anxiety.   5  . diltiazem (CARDIZEM CD) 240 MG 24 hr capsule Take 1 capsule (240 mg total) by mouth daily. 30 capsule 5  . estradiol (ESTRACE) 1 MG tablet TAKE 1 TABLET ONCE A DAY BY MOUTH 90 DAYS  1  . levothyroxine (SYNTHROID, LEVOTHROID) 100 MCG tablet Take 100 mcg by mouth every morning. On an empty stomach  0  . losartan-hydrochlorothiazide (HYZAAR) 100-25 MG tablet Take 1 tablet by mouth daily. 30 tablet 5  . Multiple Vitamin (MULTIVITAMIN) tablet Take 1 tablet by mouth daily.    Marland Kitchen NEXIUM 40 MG capsule Take 40 mg by mouth daily.  0  . Vitamin D, Ergocalciferol, (DRISDOL) 50000 UNITS CAPS capsule Take 50,000 Units by mouth every 7 (seven) days.    . DULoxetine (CYMBALTA) 30 MG capsule Take 30 mg by mouth daily.      Current Facility-Administered Medications  Medication Dose Route Frequency Provider Last Rate Last Dose  . triamcinolone acetonide (KENALOG) 10 MG/ML injection 10 mg  10 mg Other Once Landis Martins, DPM        LABS/IMAGING: No results found for this or any previous visit (from the past 48 hour(s)). No results found.  WEIGHTS: Wt Readings from Last 3 Encounters:  12/26/15 211 lb 6.4 oz (95.9 kg)  11/20/15 214 lb 12.8 oz (97.4 kg)  10/28/15 214 lb (97.1 kg)    VITALS: BP 120/80   Pulse 68   Ht 5\' 5"  (1.651 m)   Wt 211 lb 6.4 oz (95.9 kg)   BMI 35.18 kg/m   EXAM: Deferred  EKG: Normal sinus rhythm at 68, incomplete right bundle branch  block  ASSESSMENT: 1. Palpitations - PAC's and PVC's 2. Dyspnea on exertion - normal systolic function and mild diastolic dysfunction 3. Essential hypertension 4. Atypical chest wall pain-likely related to fibromyalgia 5. Anxiety  PLAN: 1.   Mrs. Kiesler has had improvement in her palpitations with that switch to diltiazem. Blood pressure is now better controlled. She continues to have some fatigue and poor sleep at night. There is concern for possible obstructive sleep apnea. I like to refer for sleep study. Plan to see her back in a few months to review those results. Otherwise no changes to her medicines today.  Pixie Casino, MD, [facility] Attending Cardiologist [place] [facility] Anvith Mauriello 12/26/2015,  9:25 AM

## 2016-01-14 ENCOUNTER — Other Ambulatory Visit: Payer: Self-pay

## 2016-01-14 MED ORDER — DILTIAZEM HCL ER COATED BEADS 240 MG PO CP24
240.0000 mg | ORAL_CAPSULE | Freq: Every day | ORAL | 5 refills | Status: DC
Start: 2016-01-14 — End: 2016-01-16

## 2016-01-16 ENCOUNTER — Other Ambulatory Visit: Payer: Self-pay

## 2016-01-16 MED ORDER — DILTIAZEM HCL ER COATED BEADS 240 MG PO CP24: 240.0000 mg | ORAL_CAPSULE | Freq: Every day | ORAL | 3 refills | Status: DC

## 2016-01-21 DIAGNOSIS — J4520 Mild intermittent asthma, uncomplicated: Secondary | ICD-10-CM | POA: Diagnosis not present

## 2016-01-21 DIAGNOSIS — F419 Anxiety disorder, unspecified: Secondary | ICD-10-CM | POA: Diagnosis not present

## 2016-01-21 DIAGNOSIS — E039 Hypothyroidism, unspecified: Secondary | ICD-10-CM | POA: Diagnosis not present

## 2016-01-21 DIAGNOSIS — F321 Major depressive disorder, single episode, moderate: Secondary | ICD-10-CM | POA: Diagnosis not present

## 2016-01-21 DIAGNOSIS — J329 Chronic sinusitis, unspecified: Secondary | ICD-10-CM | POA: Diagnosis not present

## 2016-01-21 DIAGNOSIS — E7800 Pure hypercholesterolemia, unspecified: Secondary | ICD-10-CM | POA: Diagnosis not present

## 2016-01-21 DIAGNOSIS — M797 Fibromyalgia: Secondary | ICD-10-CM | POA: Diagnosis not present

## 2016-01-21 DIAGNOSIS — K219 Gastro-esophageal reflux disease without esophagitis: Secondary | ICD-10-CM | POA: Diagnosis not present

## 2016-01-21 DIAGNOSIS — I10 Essential (primary) hypertension: Secondary | ICD-10-CM | POA: Diagnosis not present

## 2016-01-21 DIAGNOSIS — E559 Vitamin D deficiency, unspecified: Secondary | ICD-10-CM | POA: Diagnosis not present

## 2016-01-21 DIAGNOSIS — R7309 Other abnormal glucose: Secondary | ICD-10-CM | POA: Diagnosis not present

## 2016-02-09 ENCOUNTER — Other Ambulatory Visit: Payer: Self-pay | Admitting: Internal Medicine

## 2016-02-09 NOTE — Telephone Encounter (Signed)
Rx request sent to pharmacy.  

## 2016-02-10 ENCOUNTER — Ambulatory Visit (HOSPITAL_BASED_OUTPATIENT_CLINIC_OR_DEPARTMENT_OTHER): Payer: Medicare Other | Attending: Internal Medicine | Admitting: Cardiovascular Disease

## 2016-02-10 VITALS — Ht 65.0 in | Wt 209.0 lb

## 2016-02-10 DIAGNOSIS — R5383 Other fatigue: Secondary | ICD-10-CM | POA: Diagnosis not present

## 2016-02-10 DIAGNOSIS — G4733 Obstructive sleep apnea (adult) (pediatric): Secondary | ICD-10-CM

## 2016-02-10 DIAGNOSIS — G4719 Other hypersomnia: Secondary | ICD-10-CM

## 2016-02-10 DIAGNOSIS — G4710 Hypersomnia, unspecified: Secondary | ICD-10-CM | POA: Diagnosis not present

## 2016-02-10 DIAGNOSIS — R0683 Snoring: Secondary | ICD-10-CM

## 2016-02-17 NOTE — Procedures (Signed)
Patient Name: [place], Robillard Date: 02/10/2016 Gender: Female D.O.B: 1955/05/19 Age (years): 42 Referring Provider: Nadean Corwin Hilty Height (inches): 65 Interpreting Physician: Shelva Majestic MD, ABSM Weight (lbs): 209 RPSGT: Jonna Coup BMI: 35 MRN: [id number] Neck Size: 15.00  CLINICAL INFORMATION Sleep Study Type: NPSG Indication for sleep study: Excessive Daytime Sleepiness, Fatigue, Hypertension, Snoring Epworth Sleepiness Score: 13  SLEEP STUDY TECHNIQUE As per the AASM Manual for the Scoring of Sleep and Associated Events v2.3 (April 2016) with a hypopnea requiring 4% desaturations. The channels recorded and monitored were frontal, central and occipital EEG, electrooculogram (EOG), submentalis EMG (chin), nasal and oral airflow, thoracic and abdominal wall motion, anterior tibialis EMG, snore microphone, electrocardiogram, and pulse oximetry.  MEDICATIONS albuterol (PROVENTIL HFA;VENTOLIN HFA) 108 (90 BASE) MCG/ACT inhaler clonazePAM (KLONOPIN) 1 MG tablet diltiazem (CARDIZEM CD) 240 MG 24 hr capsule DULoxetine (CYMBALTA) 30 MG capsule estradiol (ESTRACE) 1 MG tablet levothyroxine (SYNTHROID, LEVOTHROID) 100 MCG tablet losartan-hydrochlorothiazide (HYZAAR) 100-25 MG tablet Multiple Vitamin (MULTIVITAMIN) tablet NEXIUM 40 MG capsule Vitamin D, Ergocalciferol, (DRISDOL) 50000 UNITS CAPS capsule Clinic-Administered Medications triamcinolone acetonide (KENALOG) 10 MG/ML injection 10 mg  SLEEP ARCHITECTURE The study was initiated at 9:46:16 PM and ended at 4:14:57 AM. Sleep onset time was 30.7 minutes and the sleep efficiency was 72.4%. The total sleep time was 281.5 minutes. Wake after sleep onset (WASO) was 76.5 minutes Stage REM latency was 138.5 minutes. The patient spent 3.73% of the night in stage N1 sleep, 84.72% in stage N2 sleep, 0.00% in stage N3 and 11.55% in REM. Alpha intrusion was absent. Supine sleep was 33.57%.  RESPIRATORY  PARAMETERS The overall apnea/hypopnea index (AHI) was 0.2 per hour. There were 0 total apneas, including 0 obstructive, 0 central and 0 mixed apneas. There were 1 hypopneas and 0 RERAs. The AHI during Stage REM sleep was 1.8 per hour. AHI while supine was 0.0 per hour. The mean oxygen saturation was 90.83%. The minimum SpO2 during sleep was 86.00%. snoring was noted during this study.  CARDIAC DATA The 2 lead EKG demonstrated sinus rhythm. The mean heart rate was N/A beats per minute. Other EKG findings include: None.  LEG MOVE MENT DATA The total PLMS were 356 with a resulting PLMS index of 75.88. Associated arousal with leg movement index was 9.4 .  IMPRESSIONS - No significant obstructive sleep apnea occurred during this study (AHI = 0.2/h; RDI 0.2/h) - No significant central sleep apnea occurred during this study (CAI = 0.0/h). - Mild oxygen desaturation to a nadir of 86.00%. - Reduced sleep efficiency. - Abnormal sleep architecture with absence of slow wave sleep and delayed latency to REM sleep. - No snoring was audible during this study. - No cardiac abnormalities were noted during this study. - Severe periodic limb movements of sleep occurred during the study. Associated arousals were significant.  DIAGNOSIS - Peridic Limb Movement Disorder of Sleep - Excessive Daytime Sleepiness  RECOMMENDATIONS - This study does not demostrate any significant sleep disordered breathing. - Significant periodic limb movement disorder with a PLMS index of 78; consider pharmacotherapy (requip, mirapex, gabapentin) if patient is symptomatic with restless legs. - If patient continues to have significant excessive daytime sleepiness consider a MLST to evaluate for idiopathic hypersomnolence or narcolepsy. - Avoid alcohol, sedatives and other CNS depressants that may worsen sleep apnea and disrupt normal sleep architecture. - Sleep hygiene should be reviewed to assess factors that may improve sleep  quality. - Weight management (BMI 35) and regular exercise should be  initiated or continued if appropriate.  [Electronically signed] 02/17/2016 08:43 PM  Shelva Majestic MD, [facility], [place], American Board of Sleep Medicine   NPI: [id number]  [place] PH: [phone]   FX: [phone] [place]

## 2016-02-20 ENCOUNTER — Telehealth: Payer: Self-pay | Admitting: *Deleted

## 2016-02-20 NOTE — Telephone Encounter (Signed)
Called patient to inform her of sleep study results and recommendations. Patient states that she continues to be very fatigued during the day. She would like for Dr Claiborne Billings to explore further. I will inform Dr Claiborne Billings of patient's request and call her back with any further recommendations. Patient voiced her understanding and will await further recommendations. Message routed to Dr Claiborne Billings for review.

## 2016-02-20 NOTE — Telephone Encounter (Signed)
-----   Message from Troy Sine, MD sent at 02/17/2016  8:47 PM EDT ----- Mariann Laster, please notify pt of results

## 2016-02-20 NOTE — Progress Notes (Signed)
Patient notified of sleep results and recommendations. 

## 2016-02-24 DIAGNOSIS — E876 Hypokalemia: Secondary | ICD-10-CM | POA: Diagnosis not present

## 2016-02-25 DIAGNOSIS — S0990XA Unspecified injury of head, initial encounter: Secondary | ICD-10-CM | POA: Diagnosis not present

## 2016-02-25 DIAGNOSIS — M546 Pain in thoracic spine: Secondary | ICD-10-CM | POA: Diagnosis not present

## 2016-02-25 DIAGNOSIS — M545 Low back pain: Secondary | ICD-10-CM | POA: Diagnosis not present

## 2016-02-25 DIAGNOSIS — S299XXA Unspecified injury of thorax, initial encounter: Secondary | ICD-10-CM | POA: Diagnosis not present

## 2016-02-25 DIAGNOSIS — M542 Cervicalgia: Secondary | ICD-10-CM | POA: Diagnosis not present

## 2016-02-25 DIAGNOSIS — R51 Headache: Secondary | ICD-10-CM | POA: Diagnosis not present

## 2016-02-25 DIAGNOSIS — M549 Dorsalgia, unspecified: Secondary | ICD-10-CM | POA: Diagnosis not present

## 2016-02-25 DIAGNOSIS — S3992XA Unspecified injury of lower back, initial encounter: Secondary | ICD-10-CM | POA: Diagnosis not present

## 2016-03-30 ENCOUNTER — Ambulatory Visit: Payer: Medicare Other | Admitting: Internal Medicine

## 2016-03-31 ENCOUNTER — Other Ambulatory Visit: Payer: Self-pay | Admitting: Internal Medicine

## 2016-04-01 NOTE — Telephone Encounter (Signed)
Rx(s) sent to pharmacy electronically.  

## 2016-04-21 ENCOUNTER — Telehealth: Payer: Self-pay | Admitting: *Deleted

## 2016-04-21 ENCOUNTER — Other Ambulatory Visit: Payer: Self-pay | Admitting: *Deleted

## 2016-04-21 DIAGNOSIS — G4711 Idiopathic hypersomnia with long sleep time: Secondary | ICD-10-CM

## 2016-04-21 NOTE — Telephone Encounter (Signed)
Called patient to inform her that Dr Claiborne Billings has ordered a MLST study to further look into her daytime fatigue.

## 2016-04-21 NOTE — Telephone Encounter (Signed)
MLST ordered. Patient notified.

## 2016-04-21 NOTE — Telephone Encounter (Signed)
No sleep apnea on sleep study.  With continued significant excessive daytime sleepiness recommend a PSG/MLST to evaluate for idiopathic hypersomnolence or narcolepsy. If she is symtomatic with restless legs, may need a trial of requip.

## 2016-04-28 ENCOUNTER — Ambulatory Visit: Payer: Medicare Other | Admitting: Internal Medicine

## 2016-05-14 ENCOUNTER — Ambulatory Visit (INDEPENDENT_AMBULATORY_CARE_PROVIDER_SITE_OTHER): Payer: Medicare Other | Admitting: Internal Medicine

## 2016-05-14 ENCOUNTER — Encounter: Payer: Self-pay | Admitting: Internal Medicine

## 2016-05-14 VITALS — BP 118/82 | HR 60 | Ht 65.0 in | Wt 209.4 lb

## 2016-05-14 DIAGNOSIS — G4719 Other hypersomnia: Secondary | ICD-10-CM | POA: Diagnosis not present

## 2016-05-14 DIAGNOSIS — I341 Nonrheumatic mitral (valve) prolapse: Secondary | ICD-10-CM | POA: Diagnosis not present

## 2016-05-14 DIAGNOSIS — R002 Palpitations: Secondary | ICD-10-CM | POA: Diagnosis not present

## 2016-05-14 DIAGNOSIS — M797 Fibromyalgia: Secondary | ICD-10-CM | POA: Diagnosis not present

## 2016-05-14 NOTE — Progress Notes (Signed)
OFFICE NOTE  Chief Complaint:  Follow-up somnolence  Primary Care Physician: Shirline Frees, MD  HPI:  Glenda Evans is a 61 y.o. female with a past medical history significant for some anxiety, fibromyalgia, hypertension, palpitations and hypothyroidism. She initially had evaluation of her palpitations in the early 2000's by Dr. Pernell Dupre. At the time she wore monitor, had a stress test and echocardiogram. The echocardiogram suggested mitral valve prolapse. Her monitor failed to show clear etiology of her symptoms. She does have a history of PVCs on her chart however those were not noted by EKG today. Recently she said she's had some increasing burden of palpitations. She denies any worsening stress. She also has sharp chest discomfort. She feels like this is different than her fibromyalgia, however it was reproduced during auscultation with my stethoscope today. She's had close to 20 pound weight gain over the past several months due to some orthopedic problems in her feet which she says is decreased her exercise. This certainly could explain some of her shortness of breath. She feels like the episodes of palpitations that occur very infrequently are paroxysmal. Oftentimes include heart racing and then can be followed by feelings of significant fatigue which is worse than her daily chronic fatigue.  09/17/2015  Glenda Evans returns today for follow-up. She wore a monitor which demonstrated PACs and has had some PVCs. Her echocardiogram showed normal systolic function with mild diastolic dysfunction. There was no evidence of mitral valve prolapse that was diagnostically significant. We talked about possible medication changes to try to help with her palpitations. She does note that she has significant side effects from verapamil including possible constipation.   12/26/2015  Glenda Evans returns today for follow-up. She reports marked improvement in her palpitations with adjustment in her  medications. Blood pressure is excellent today at 120/80. She denies any chest pain or worsening shortness of breath. She continues to be fatigued. Sleep is poor at night and generally she is quite awake. Occasionally she gas for breath but does not report heavy snoring. She naps a lot during the day.  05/14/2016  Glenda Evans reports improvement in her palpitations on diltiazem and she is pleased with good blood pressure control. She continues to struggle with daytime somnolence and insomnia at night. She manages only to get a couple hours of sleep at night but generally can sleep 6-8 hours during the day. He feels chronically fatigued. She underwent a sleep study which was negative for obstructive sleep apnea however might of been concern for either restless leg syndrome which she vehemently denies or perhaps narcolepsy disorder. Dr. Claiborne Billings ordered and an MSLT test. This is pending. She does use clonazepam to try to help with sleep and anxiety at night but she says is rarely effective. Her main issues not being able to turn her brain off at night.  PMHx:  Past Medical History:  Diagnosis Date  . Anxiety   . Arthritis   . Asthma   . Depression   . Fatigue   . Fibromyalgia   . GERD (gastroesophageal reflux disease)   . HLD (hyperlipidemia)   . Hyperglycemia   . Hypothyroidism   . IBS (irritable bowel syndrome)   . Migraine   . Mitral valve prolapse   . PVC (premature ventricular contraction)   . Stein-Leventhal syndrome   . Wears glasses     Past Surgical History:  Procedure Laterality Date  . ABDOMINAL HYSTERECTOMY    . CESAREAN SECTION  x 2  . CHOLECYSTECTOMY    . LIPOMA EXCISION  11/26/10   thigh x3 cyst  . TONSILLECTOMY      FAMHx:  Family History  Problem Relation Age of Onset  . COPD Father   . Hypertension Father   . Colon polyps Father   . Diabetes Sister   . Colon polyps Mother   . Heart disease Mother     MVP  . Breast cancer Maternal Grandmother   . Colon  polyps Maternal Grandmother   . Diabetes Maternal Grandmother   . Heart disease Maternal Grandmother   . COPD Maternal Grandmother   . Ovarian cancer Paternal Aunt   . Colon polyps Sister   . Colon polyps Maternal Aunt     x 2  . Diabetes Maternal Aunt   . Heart disease Sister     MVP  . COPD Maternal Aunt     SOCHx:   reports that she has never smoked. She has never used smokeless tobacco. She reports that she does not drink alcohol or use drugs.  ALLERGIES:  Allergies  Allergen Reactions  . Macrobid [Nitrofurantoin Macrocrystal] Swelling  . Augmentin [Amoxicillin-Pot Clavulanate]   . Cymbalta [Duloxetine Hcl]   . Erythromycin   . Escitalopram Oxalate   . Lyrica [Pregabalin]   . Neurontin [Gabapentin]   . Ofloxacin   . Pamelor   . Relafen [Nabumetone]   . Septra [Bactrim]   . Skelaxin   . Sulfa Antibiotics     ROS: Pertinent items noted in HPI and remainder of comprehensive ROS otherwise negative.  HOME MEDS: Current Outpatient Prescriptions  Medication Sig Dispense Refill  . albuterol (PROVENTIL HFA;VENTOLIN HFA) 108 (90 BASE) MCG/ACT inhaler Inhale 1 puff into the lungs every 6 (six) hours as needed for wheezing or shortness of breath.    . clonazePAM (KLONOPIN) 1 MG tablet Take 1 mg by mouth at bedtime as needed for anxiety.   5  . diltiazem (CARDIZEM CD) 240 MG 24 hr capsule TAKE 1 CAPSULE (240 MG TOTAL) BY MOUTH DAILY. 30 capsule 6  . DULoxetine (CYMBALTA) 30 MG capsule Take 30 mg by mouth daily.     Marland Kitchen estradiol (ESTRACE) 1 MG tablet TAKE 1 TABLET ONCE A DAY BY MOUTH 90 DAYS  1  . levothyroxine (SYNTHROID, LEVOTHROID) 100 MCG tablet Take 100 mcg by mouth every morning. On an empty stomach  0  . losartan-hydrochlorothiazide (HYZAAR) 100-25 MG tablet TAKE 1 TABLET BY MOUTH DAILY. 30 tablet 8  . Multiple Vitamin (MULTIVITAMIN) tablet Take 1 tablet by mouth daily.    Marland Kitchen NEXIUM 40 MG capsule Take 40 mg by mouth daily.  0  . Vitamin D, Ergocalciferol, (DRISDOL) 50000  UNITS CAPS capsule Take 50,000 Units by mouth every 7 (seven) days.     Current Facility-Administered Medications  Medication Dose Route Frequency Provider Last Rate Last Dose  . triamcinolone acetonide (KENALOG) 10 MG/ML injection 10 mg  10 mg Other Once Landis Martins, DPM        LABS/IMAGING: No results found for this or any previous visit (from the past 48 hour(s)). No results found.  WEIGHTS: Wt Readings from Last 3 Encounters:  05/14/16 209 lb 6.4 oz (95 kg)  02/10/16 209 lb (94.8 kg)  12/26/15 211 lb 6.4 oz (95.9 kg)    VITALS: BP 118/82   Pulse 60   Ht 5\' 5"  (1.651 m)   Wt 209 lb 6.4 oz (95 kg)   BMI 34.85 kg/m   EXAM: General  appearance: alert and no distress Neck: no carotid bruit and no JVD Lungs: clear to auscultation bilaterally Heart: regular rate and rhythm, S1, S2 normal, no murmur, click, rub or gallop Abdomen: soft, non-tender; bowel sounds normal; no masses,  no organomegaly Extremities: extremities normal, atraumatic, no cyanosis or edema Pulses: 2+ and symmetric Skin: Skin color, texture, turgor normal. No rashes or lesions Neurologic: Grossly normal Psych: Pleasant  EKG: Normal sinus rhythm at 60, nonspecific T wave changes  ASSESSMENT: 1. Palpitations - PAC's and PVC's (improved on diltiazem) 2. Dyspnea on exertion - normal systolic function and mild diastolic dysfunction 3. Essential hypertension 4. Atypical chest wall pain-likely related to fibromyalgia 5. Anxiety 6. Hypersomnolence  PLAN: 1.   Glenda Evans continues to struggle with somnolence. There is a question of whether she might have narcolepsy. Additional testing is can be performed. If this testing is negative, consideration should be given for perhaps another sleep aid at night to try to reestablish nocturnal sleep and decrease her daytime somnolence. One option might be Belsomra. This is a relatively newer medication however can help with suppressing the wake centers in the brain  which may be helpful for somebody with her sleep disorder. I do not have much experience in using this medication would defer to her primary care provider to see if this could be safely used in combination with Cymbalta and clonazepam, all of which may lead to increasing fatigue and sedation.  Follow-up in 6 months.  Pixie Casino, MD, [facility] Attending Cardiologist [place] 05/14/2016, 1:36 PM

## 2016-05-14 NOTE — Patient Instructions (Signed)
Your physician wants you to follow-up in: [place] with Dr. Debara Pickett. You will receive a reminder letter in the mail two months in advance. If you don't receive a letter, please call our office to schedule the follow-up appointment.  Please have your sleep test ordered by Dr. Claiborne Billings scheduled.

## 2016-05-31 ENCOUNTER — Ambulatory Visit (HOSPITAL_BASED_OUTPATIENT_CLINIC_OR_DEPARTMENT_OTHER): Payer: Medicare Other | Attending: Cardiovascular Disease | Admitting: Cardiovascular Disease

## 2016-05-31 VITALS — Ht 65.0 in | Wt 205.0 lb

## 2016-05-31 DIAGNOSIS — G4711 Idiopathic hypersomnia with long sleep time: Secondary | ICD-10-CM | POA: Insufficient documentation

## 2016-05-31 DIAGNOSIS — G4710 Hypersomnia, unspecified: Secondary | ICD-10-CM | POA: Insufficient documentation

## 2016-05-31 DIAGNOSIS — G4719 Other hypersomnia: Secondary | ICD-10-CM | POA: Diagnosis not present

## 2016-06-04 DIAGNOSIS — M7061 Trochanteric bursitis, right hip: Secondary | ICD-10-CM | POA: Diagnosis not present

## 2016-06-04 DIAGNOSIS — M5417 Radiculopathy, lumbosacral region: Secondary | ICD-10-CM | POA: Diagnosis not present

## 2016-06-06 ENCOUNTER — Encounter: Payer: Self-pay | Admitting: Internal Medicine

## 2016-06-09 ENCOUNTER — Other Ambulatory Visit (HOSPITAL_BASED_OUTPATIENT_CLINIC_OR_DEPARTMENT_OTHER): Payer: Self-pay

## 2016-06-09 DIAGNOSIS — G4711 Idiopathic hypersomnia with long sleep time: Secondary | ICD-10-CM

## 2016-06-11 DIAGNOSIS — J111 Influenza due to unidentified influenza virus with other respiratory manifestations: Secondary | ICD-10-CM | POA: Diagnosis not present

## 2016-06-15 NOTE — Procedures (Signed)
     Patient Name: Glenda Evans, [place] Date: 05/31/2016 Gender: Female D.O.B: 04-30-1955 Age (years): 17 Referring Provider: Nadean Corwin Hilty Height (inches): 65 Interpreting Physician: Shelva Majestic MD, ABSM Weight (lbs): 205 RPSGT: Jacolyn Reedy BMI: 34 MRN: [id number] Neck Size: 15.00  CLINICAL INFORMATION Sleep Study Type: MSLT  The patient was referred to the sleep center for evaluation of daytime sleepiness.  Epworth Sleepiness Score: 13  Most recent polysomnogram dated 02/10/2016 revealed an AHI of 0.2/h and RDI of 0.2/h.  SLEEP STUDY TECHNIQUE A Multiple Sleep Latency Test was performed after an overnight polysomnogram according to the AASM scoring manual v2.3 (April 2016) and clinical guidelines. Five nap opportunities occurred over the course of the test which followed an overnight polysomnogram. The channels recorded and monitored were frontal, central, and occipital electroencephalography (EEG), right and left electrooculogram (EOG), chin electromyography (EMG), and electrocardiogram (EKG).  MEDICATIONS albuterol (PROVENTIL HFA;VENTOLIN HFA) 108 (90 BASE) MCG/ACT inhaler clonazePAM (KLONOPIN) 1 MG tablet diltiazem (CARDIZEM CD) 240 MG 24 hr capsule DULoxetine (CYMBALTA) 30 MG capsule estradiol (ESTRACE) 1 MG tablet levothyroxine (SYNTHROID, LEVOTHROID) 100 MCG tablet losartan-hydrochlorothiazide (HYZAAR) 100-25 MG tablet Multiple Vitamin (MULTIVITAMIN) tablet NEXIUM 40 MG capsule Vitamin D, Ergocalciferol, (DRISDOL) 50000 UNITS CAPS capsule  Medications administered by patient during sleep study : No sleep medicine administered.  IMPRESSIONS - Total number of naps attempted: 5.  Total number of naps with sleep attained: 4. The Mean Sleep Latency was 06:42 minutes. There were  0 sleep-onset REM periods. - The patient appears to have pathologic sleepiness, evidenced by a short mean sleep latency (8 minutes or less) on this MSLT. - No sleep onset REMs  were noted during this MSLT. DIAGNOSIS - Pathologic Sleepiness (- [G47.10 ICD-10]) - Idiopathic hypersomnia (327.11 [G47.11 ICD-10])  RECOMMENDATIONS - There is no evidence of narcolepsy with 0 sleep onset REM periods. Consider Idiopathic Hypersomnia with short mean latency. - If patient continues to experience excessive daytime sleepiness, consider a trial of provigil or nuvigil and evaluate other causes of excessive daytime sleepiness.  [Electronically signed] 06/15/2016 08:13 PM  Shelva Majestic MD, [facility], [place], American Board of Sleep Medicine   NPI: [id number]  [place] PH: [phone]   FX: [phone] [place]

## 2016-06-21 DIAGNOSIS — H25811 Combined forms of age-related cataract, right eye: Secondary | ICD-10-CM | POA: Diagnosis not present

## 2016-06-21 DIAGNOSIS — H11153 Pinguecula, bilateral: Secondary | ICD-10-CM | POA: Diagnosis not present

## 2016-06-22 DIAGNOSIS — N760 Acute vaginitis: Secondary | ICD-10-CM | POA: Diagnosis not present

## 2016-06-22 DIAGNOSIS — B373 Candidiasis of vulva and vagina: Secondary | ICD-10-CM | POA: Diagnosis not present

## 2016-07-02 ENCOUNTER — Telehealth: Payer: Self-pay | Admitting: *Deleted

## 2016-07-02 NOTE — Telephone Encounter (Signed)
-----   Message from Troy Sine, MD sent at 06/15/2016  8:19 PM EST ----- Mariann Laster, please notify pt of results;  No evidence for narcolepsy; If significantly sleepy consider nuvigil/provigil

## 2016-07-02 NOTE — Telephone Encounter (Signed)
Left message on patient's home machine.

## 2016-07-02 NOTE — Telephone Encounter (Signed)
Left detailed message with sleep study results and recommendations. ( okay per DPR) Call office back if recommended prescription wanted.

## 2016-07-20 ENCOUNTER — Other Ambulatory Visit: Payer: Self-pay | Admitting: Obstetrics and Gynecology

## 2016-07-20 DIAGNOSIS — Z1231 Encounter for screening mammogram for malignant neoplasm of breast: Secondary | ICD-10-CM

## 2016-07-26 DIAGNOSIS — E039 Hypothyroidism, unspecified: Secondary | ICD-10-CM | POA: Diagnosis not present

## 2016-07-26 DIAGNOSIS — M797 Fibromyalgia: Secondary | ICD-10-CM | POA: Diagnosis not present

## 2016-07-26 DIAGNOSIS — E876 Hypokalemia: Secondary | ICD-10-CM | POA: Diagnosis not present

## 2016-07-26 DIAGNOSIS — G4711 Idiopathic hypersomnia with long sleep time: Secondary | ICD-10-CM | POA: Diagnosis not present

## 2016-07-26 DIAGNOSIS — F324 Major depressive disorder, single episode, in partial remission: Secondary | ICD-10-CM | POA: Diagnosis not present

## 2016-07-26 DIAGNOSIS — R7303 Prediabetes: Secondary | ICD-10-CM | POA: Diagnosis not present

## 2016-07-26 DIAGNOSIS — F5101 Primary insomnia: Secondary | ICD-10-CM | POA: Diagnosis not present

## 2016-07-26 DIAGNOSIS — Z6835 Body mass index (BMI) 35.0-35.9, adult: Secondary | ICD-10-CM | POA: Diagnosis not present

## 2016-07-26 DIAGNOSIS — E7800 Pure hypercholesterolemia, unspecified: Secondary | ICD-10-CM | POA: Diagnosis not present

## 2016-07-26 DIAGNOSIS — I10 Essential (primary) hypertension: Secondary | ICD-10-CM | POA: Diagnosis not present

## 2016-07-26 DIAGNOSIS — J4520 Mild intermittent asthma, uncomplicated: Secondary | ICD-10-CM | POA: Diagnosis not present

## 2016-08-02 DIAGNOSIS — M7061 Trochanteric bursitis, right hip: Secondary | ICD-10-CM | POA: Diagnosis not present

## 2016-08-06 DIAGNOSIS — J0100 Acute maxillary sinusitis, unspecified: Secondary | ICD-10-CM | POA: Diagnosis not present

## 2016-08-09 ENCOUNTER — Ambulatory Visit: Payer: Medicare Other

## 2016-08-16 ENCOUNTER — Ambulatory Visit
Admission: RE | Admit: 2016-08-16 | Discharge: 2016-08-16 | Disposition: A | Discharge status: Home / Self Care | Payer: Medicare Other | Source: Ambulatory Visit | Attending: Obstetrics and Gynecology | Admitting: Obstetrics and Gynecology

## 2016-08-16 DIAGNOSIS — Z1231 Encounter for screening mammogram for malignant neoplasm of breast: Secondary | ICD-10-CM

## 2016-08-26 DIAGNOSIS — E876 Hypokalemia: Secondary | ICD-10-CM | POA: Diagnosis not present

## 2016-10-19 DIAGNOSIS — D2261 Melanocytic nevi of right upper limb, including shoulder: Secondary | ICD-10-CM | POA: Diagnosis not present

## 2016-10-19 DIAGNOSIS — D225 Melanocytic nevi of trunk: Secondary | ICD-10-CM | POA: Diagnosis not present

## 2016-10-19 DIAGNOSIS — D485 Neoplasm of uncertain behavior of skin: Secondary | ICD-10-CM | POA: Diagnosis not present

## 2016-10-19 DIAGNOSIS — D2262 Melanocytic nevi of left upper limb, including shoulder: Secondary | ICD-10-CM | POA: Diagnosis not present

## 2016-10-19 DIAGNOSIS — D1724 Benign lipomatous neoplasm of skin and subcutaneous tissue of left leg: Secondary | ICD-10-CM | POA: Diagnosis not present

## 2016-10-19 DIAGNOSIS — L821 Other seborrheic keratosis: Secondary | ICD-10-CM | POA: Diagnosis not present

## 2016-10-19 DIAGNOSIS — D2272 Melanocytic nevi of left lower limb, including hip: Secondary | ICD-10-CM | POA: Diagnosis not present

## 2016-10-27 DIAGNOSIS — D2339 Other benign neoplasm of skin of other parts of face: Secondary | ICD-10-CM | POA: Diagnosis not present

## 2016-10-27 DIAGNOSIS — D485 Neoplasm of uncertain behavior of skin: Secondary | ICD-10-CM | POA: Diagnosis not present

## 2016-11-15 ENCOUNTER — Encounter: Payer: Self-pay | Admitting: *Deleted

## 2016-11-25 ENCOUNTER — Ambulatory Visit: Payer: Medicare Other | Admitting: Internal Medicine

## 2016-12-01 DIAGNOSIS — S1096XA Insect bite of unspecified part of neck, initial encounter: Secondary | ICD-10-CM | POA: Diagnosis not present

## 2016-12-04 ENCOUNTER — Other Ambulatory Visit: Payer: Self-pay | Admitting: Internal Medicine

## 2016-12-06 NOTE — Telephone Encounter (Signed)
Rx(s) sent to pharmacy electronically.  

## 2016-12-16 ENCOUNTER — Ambulatory Visit (INDEPENDENT_AMBULATORY_CARE_PROVIDER_SITE_OTHER): Payer: Medicare Other | Admitting: Internal Medicine

## 2016-12-16 ENCOUNTER — Encounter: Payer: Self-pay | Admitting: Internal Medicine

## 2016-12-16 VITALS — BP 112/72 | HR 64 | Ht 65.0 in | Wt 207.0 lb

## 2016-12-16 DIAGNOSIS — R002 Palpitations: Secondary | ICD-10-CM

## 2016-12-16 DIAGNOSIS — M797 Fibromyalgia: Secondary | ICD-10-CM | POA: Diagnosis not present

## 2016-12-16 DIAGNOSIS — R600 Localized edema: Secondary | ICD-10-CM | POA: Insufficient documentation

## 2016-12-16 DIAGNOSIS — M79605 Pain in left leg: Secondary | ICD-10-CM

## 2016-12-16 DIAGNOSIS — M79604 Pain in right leg: Secondary | ICD-10-CM | POA: Insufficient documentation

## 2016-12-16 NOTE — Progress Notes (Signed)
OFFICE NOTE  Chief Complaint:  Leg pain, swelling  Primary Care Physician: Shirline Frees, MD  HPI:  Glenda Evans is a 61 y.o. female with a past medical history significant for some anxiety, fibromyalgia, hypertension, palpitations and hypothyroidism. She initially had evaluation of her palpitations in the early 2000's by Dr. Pernell Dupre. At the time she wore monitor, had a stress test and echocardiogram. The echocardiogram suggested mitral valve prolapse. Her monitor failed to show clear etiology of her symptoms. She does have a history of PVCs on her chart however those were not noted by EKG today. Recently she said she's had some increasing burden of palpitations. She denies any worsening stress. She also has sharp chest discomfort. She feels like this is different than her fibromyalgia, however it was reproduced during auscultation with my stethoscope today. She's had close to 20 pound weight gain over the past several months due to some orthopedic problems in her feet which she says is decreased her exercise. This certainly could explain some of her shortness of breath. She feels like the episodes of palpitations that occur very infrequently are paroxysmal. Oftentimes include heart racing and then can be followed by feelings of significant fatigue which is worse than her daily chronic fatigue.  09/17/2015  Mrs. Rape returns today for follow-up. She wore a monitor which demonstrated PACs and has had some PVCs. Her echocardiogram showed normal systolic function with mild diastolic dysfunction. There was no evidence of mitral valve prolapse that was diagnostically significant. We talked about possible medication changes to try to help with her palpitations. She does note that she has significant side effects from verapamil including possible constipation.   12/26/2015  Mrs. Percle returns today for follow-up. She reports marked improvement in her palpitations with adjustment in her  medications. Blood pressure is excellent today at 120/80. She denies any chest pain or worsening shortness of breath. She continues to be fatigued. Sleep is poor at night and generally she is quite awake. Occasionally she gas for breath but does not report heavy snoring. She naps a lot during the day.  05/14/2016  Mrs. Lazarus reports improvement in her palpitations on diltiazem and she is pleased with good blood pressure control. She continues to struggle with daytime somnolence and insomnia at night. She manages only to get a couple hours of sleep at night but generally can sleep 6-8 hours during the day. He feels chronically fatigued. She underwent a sleep study which was negative for obstructive sleep apnea however might of been concern for either restless leg syndrome which she vehemently denies or perhaps narcolepsy disorder. Dr. Claiborne Billings ordered and an MSLT test. This is pending. She does use clonazepam to try to help with sleep and anxiety at night but she says is rarely effective. Her main issues not being able to turn her brain off at night.  12/16/2016  Mrs. Hippert was seen today in follow-up. Recently she's been concerned about leg pain and swelling. She did have a sleep study which was negative for sleep apnea. She reports her palpitations have improved on diltiazem. She does carry a diagnosis of fibromyalgia may have chronic fatigue syndrome. In addition she has some degree of neuropathy but has been intolerant to neuropathic pain medicines. She reports shooting pain down both legs. Seems to be worse in the right. There is some associated swelling. It's worse when she walks and gets better when she rests. She reports some cold feet. She's concerned as her husband recently had  popliteal aneurysms and ultimately had to have amputation of one of his legs. She's also concerned about same prominent veins as it may be related to varicose veins. She wonders if she may have had a blood clot.  PMHx:    Past Medical History:  Diagnosis Date  . Anxiety   . Arthritis   . Asthma   . Depression   . Fatigue   . Fibromyalgia   . GERD (gastroesophageal reflux disease)   . HLD (hyperlipidemia)   . Hyperglycemia   . Hypothyroidism   . IBS (irritable bowel syndrome)   . Migraine   . Mitral valve prolapse   . PVC (premature ventricular contraction)   . Stein-Leventhal syndrome   . Wears glasses     Past Surgical History:  Procedure Laterality Date  . ABDOMINAL HYSTERECTOMY    . CESAREAN SECTION     x 2  . CHOLECYSTECTOMY    . LIPOMA EXCISION  11/26/10   thigh x3 cyst  . TONSILLECTOMY      FAMHx:  Family History  Problem Relation Age of Onset  . COPD Father   . Hypertension Father   . Colon polyps Father   . Diabetes Sister   . Colon polyps Mother   . Heart disease Mother        MVP  . Breast cancer Maternal Grandmother   . Colon polyps Maternal Grandmother   . Diabetes Maternal Grandmother   . Heart disease Maternal Grandmother   . COPD Maternal Grandmother   . Ovarian cancer Paternal Aunt   . Colon polyps Sister   . Colon polyps Maternal Aunt        x 2  . Diabetes Maternal Aunt   . Heart disease Sister        MVP  . COPD Maternal Aunt     SOCHx:   reports that she has never smoked. She has never used smokeless tobacco. She reports that she does not drink alcohol or use drugs.  ALLERGIES:  Allergies  Allergen Reactions  . Macrobid [Nitrofurantoin Macrocrystal] Swelling  . Augmentin [Amoxicillin-Pot Clavulanate]   . Erythromycin   . Escitalopram Oxalate   . Lyrica [Pregabalin]   . Neurontin [Gabapentin]   . Ofloxacin   . Pamelor   . Relafen [Nabumetone]   . Septra [Bactrim]   . Skelaxin   . Sulfa Antibiotics     ROS: Pertinent items noted in HPI and remainder of comprehensive ROS otherwise negative.  HOME MEDS: Current Outpatient Prescriptions  Medication Sig Dispense Refill  . albuterol (PROVENTIL HFA;VENTOLIN HFA) 108 (90 BASE) MCG/ACT inhaler  Inhale 1 puff into the lungs every 6 (six) hours as needed for wheezing or shortness of breath.    . clonazePAM (KLONOPIN) 1 MG tablet Take 1 mg by mouth at bedtime as needed for anxiety.   5  . diltiazem (CARDIZEM CD) 240 MG 24 hr capsule TAKE 1 CAPSULE (240 MG TOTAL) BY MOUTH DAILY. 30 capsule 6  . DULoxetine (CYMBALTA) 30 MG capsule Take 30 mg by mouth daily.     Marland Kitchen estradiol (ESTRACE) 1 MG tablet TAKE 1 TABLET ONCE A DAY BY MOUTH 90 DAYS  1  . KLOR-CON M20 20 MEQ tablet Take 20 mEq by mouth daily. with food  5  . levothyroxine (SYNTHROID, LEVOTHROID) 100 MCG tablet Take 100 mcg by mouth every morning. On an empty stomach  0  . losartan-hydrochlorothiazide (HYZAAR) 100-25 MG tablet TAKE 1 TABLET BY MOUTH DAILY. 30 tablet 8  .  Multiple Vitamin (MULTIVITAMIN) tablet Take 1 tablet by mouth daily.    Marland Kitchen NEXIUM 40 MG capsule Take 40 mg by mouth daily.  0  . Vitamin D, Ergocalciferol, (DRISDOL) 50000 UNITS CAPS capsule Take 50,000 Units by mouth every 7 (seven) days.     Current Facility-Administered Medications  Medication Dose Route Frequency Provider Last Rate Last Dose  . triamcinolone acetonide (KENALOG) 10 MG/ML injection 10 mg  10 mg Other Once Landis Martins, DPM        LABS/IMAGING: No results found for this or any previous visit (from the past 48 hour(s)). No results found.  WEIGHTS: Wt Readings from Last 3 Encounters:  12/16/16 207 lb (93.9 kg)  05/31/16 205 lb (93 kg)  05/14/16 209 lb 6.4 oz (95 kg)    VITALS: BP 112/72   Pulse 64   Ht 5\' 5"  (1.651 m)   Wt 207 lb (93.9 kg)   BMI 34.45 kg/m   EXAM: General appearance: alert and no distress Neck: no carotid bruit and no JVD Lungs: clear to auscultation bilaterally Heart: regular rate and rhythm, S1, S2 normal, no murmur, click, rub or gallop Abdomen: soft, non-tender; bowel sounds normal; no masses,  no organomegaly Extremities: extremities normal, atraumatic, no cyanosis or edema and varicose veins noted Pulses: 2+  and symmetric Skin: Skin color, texture, turgor normal. No rashes or lesions Neurologic: Grossly normal Psych: Moderately anxious  EKG: Normal sinus rhythm at 64, incomplete RBBB-personally reviewed  ASSESSMENT: 1. Bilateral leg pain and swelling 2. Palpitations - PAC's and PVC's (improved on diltiazem) 3. Dyspnea on exertion - normal systolic function and mild diastolic dysfunction 4. Essential hypertension 5. Atypical chest wall pain-likely related to fibromyalgia 6. Anxiety 7. Hypersomnolence  PLAN: 1.   Mrs. Mclane has bilateral leg pain and swelling which is likely due to neuropathy. There are some small varicose veins that she could have an element of venous insufficiency. She feels fullness but behind both knees which she is concerned about being a popliteal aneurysm. I told her that's very unlikely that she told me that her on say had that and ultimately had to have an amputation. She also reports pain that shoots down the leg and pain gets worse with exertion that relieves with rest. She is concerned about DVT although there is no asymmetric swelling or redness however there is tenderness with mild palpation. Distal pulses are difficult to palpate and may be mildly reduced. We'll plan arterial and venous Dopplers of the lower extremity. She understands that this would require a lot of pressure by the sonographer and could cause her discomfort however she said she could "bear it" in order to be reassured that she does not have any blockages.  Follow-up annually or sooner as necessary. I'll contact her with results of her studies.  Pixie Casino, MD, [facility] Attending Cardiologist [place] 12/16/2016, 1:38 PM

## 2016-12-16 NOTE — Patient Instructions (Addendum)
Your physician has requested that you have a lower extremity venous duplex. This test is an ultrasound of the veins in the legs or arms. It looks at venous blood flow that carries blood from the heart to the legs or arms. Allow one hour for a Lower Venous exam. Allow thirty minutes for an Upper Venous exam. There are no restrictions or special instructions.  Your physician has requested that you have a lower extremity arterial duplex. This test is an ultrasound of the arteries in the legs or arms. It looks at arterial blood flow in the legs and arms. Allow one hour for Lower and Upper Arterial scans. There are no restrictions or special instructions Leg  Your physician wants you to follow-up in: ONE YEAR with Dr. Debara Pickett. You will receive a reminder letter in the mail two months in advance. If you don't receive a letter, please call our office to schedule the follow-up appointment.

## 2016-12-24 ENCOUNTER — Other Ambulatory Visit: Payer: Self-pay | Admitting: Internal Medicine

## 2016-12-24 DIAGNOSIS — M79604 Pain in right leg: Secondary | ICD-10-CM

## 2016-12-24 DIAGNOSIS — M79605 Pain in left leg: Principal | ICD-10-CM

## 2016-12-27 DIAGNOSIS — D485 Neoplasm of uncertain behavior of skin: Secondary | ICD-10-CM | POA: Diagnosis not present

## 2016-12-27 DIAGNOSIS — L818 Other specified disorders of pigmentation: Secondary | ICD-10-CM | POA: Diagnosis not present

## 2016-12-27 DIAGNOSIS — H029 Unspecified disorder of eyelid: Secondary | ICD-10-CM | POA: Diagnosis not present

## 2017-01-04 DIAGNOSIS — D485 Neoplasm of uncertain behavior of skin: Secondary | ICD-10-CM | POA: Diagnosis not present

## 2017-01-04 DIAGNOSIS — I479 Paroxysmal tachycardia, unspecified: Secondary | ICD-10-CM | POA: Diagnosis not present

## 2017-01-04 DIAGNOSIS — Z01818 Encounter for other preprocedural examination: Secondary | ICD-10-CM | POA: Diagnosis not present

## 2017-01-04 DIAGNOSIS — H029 Unspecified disorder of eyelid: Secondary | ICD-10-CM | POA: Diagnosis not present

## 2017-01-06 ENCOUNTER — Encounter (HOSPITAL_COMMUNITY): Payer: Medicare Other

## 2017-01-06 ENCOUNTER — Ambulatory Visit (HOSPITAL_COMMUNITY)
Admission: RE | Admit: 2017-01-06 | Discharge: 2017-01-06 | Disposition: A | Discharge status: Home / Self Care | Payer: Medicare Other | Source: Ambulatory Visit | Attending: Cardiology | Admitting: Cardiology

## 2017-01-06 DIAGNOSIS — M79605 Pain in left leg: Secondary | ICD-10-CM | POA: Insufficient documentation

## 2017-01-06 DIAGNOSIS — I10 Essential (primary) hypertension: Secondary | ICD-10-CM | POA: Insufficient documentation

## 2017-01-06 DIAGNOSIS — M79604 Pain in right leg: Secondary | ICD-10-CM | POA: Diagnosis not present

## 2017-01-06 DIAGNOSIS — M7989 Other specified soft tissue disorders: Secondary | ICD-10-CM | POA: Insufficient documentation

## 2017-01-06 DIAGNOSIS — M79609 Pain in unspecified limb: Secondary | ICD-10-CM | POA: Diagnosis not present

## 2017-01-06 DIAGNOSIS — R600 Localized edema: Secondary | ICD-10-CM

## 2017-01-13 DIAGNOSIS — K219 Gastro-esophageal reflux disease without esophagitis: Secondary | ICD-10-CM | POA: Diagnosis not present

## 2017-01-13 DIAGNOSIS — J45909 Unspecified asthma, uncomplicated: Secondary | ICD-10-CM | POA: Diagnosis not present

## 2017-01-13 DIAGNOSIS — F329 Major depressive disorder, single episode, unspecified: Secondary | ICD-10-CM | POA: Diagnosis not present

## 2017-01-13 DIAGNOSIS — M797 Fibromyalgia: Secondary | ICD-10-CM | POA: Diagnosis not present

## 2017-01-13 DIAGNOSIS — D2311 Other benign neoplasm of skin of right eyelid, including canthus: Secondary | ICD-10-CM | POA: Diagnosis not present

## 2017-01-13 DIAGNOSIS — D485 Neoplasm of uncertain behavior of skin: Secondary | ICD-10-CM | POA: Diagnosis not present

## 2017-01-13 DIAGNOSIS — E039 Hypothyroidism, unspecified: Secondary | ICD-10-CM | POA: Diagnosis not present

## 2017-01-13 DIAGNOSIS — Z888 Allergy status to other drugs, medicaments and biological substances status: Secondary | ICD-10-CM | POA: Diagnosis not present

## 2017-01-13 DIAGNOSIS — D2211 Melanocytic nevi of right eyelid, including canthus: Secondary | ICD-10-CM | POA: Diagnosis not present

## 2017-01-13 DIAGNOSIS — Z882 Allergy status to sulfonamides status: Secondary | ICD-10-CM | POA: Diagnosis not present

## 2017-01-13 DIAGNOSIS — Z881 Allergy status to other antibiotic agents status: Secondary | ICD-10-CM | POA: Diagnosis not present

## 2017-01-13 DIAGNOSIS — Z79899 Other long term (current) drug therapy: Secondary | ICD-10-CM | POA: Diagnosis not present

## 2017-01-13 DIAGNOSIS — H029 Unspecified disorder of eyelid: Secondary | ICD-10-CM | POA: Diagnosis not present

## 2017-01-13 DIAGNOSIS — I10 Essential (primary) hypertension: Secondary | ICD-10-CM | POA: Diagnosis not present

## 2017-01-20 ENCOUNTER — Ambulatory Visit (HOSPITAL_COMMUNITY)
Admission: RE | Admit: 2017-01-20 | Discharge: 2017-01-20 | Disposition: A | Discharge status: Home / Self Care | Payer: Medicare Other | Source: Ambulatory Visit | Attending: Cardiology | Admitting: Cardiology

## 2017-01-20 DIAGNOSIS — M79605 Pain in left leg: Secondary | ICD-10-CM | POA: Diagnosis not present

## 2017-01-20 DIAGNOSIS — M79604 Pain in right leg: Secondary | ICD-10-CM

## 2017-01-26 DIAGNOSIS — M797 Fibromyalgia: Secondary | ICD-10-CM | POA: Diagnosis not present

## 2017-01-26 DIAGNOSIS — F419 Anxiety disorder, unspecified: Secondary | ICD-10-CM | POA: Diagnosis not present

## 2017-01-26 DIAGNOSIS — K219 Gastro-esophageal reflux disease without esophagitis: Secondary | ICD-10-CM | POA: Diagnosis not present

## 2017-01-26 DIAGNOSIS — J4520 Mild intermittent asthma, uncomplicated: Secondary | ICD-10-CM | POA: Diagnosis not present

## 2017-01-26 DIAGNOSIS — R7303 Prediabetes: Secondary | ICD-10-CM | POA: Diagnosis not present

## 2017-01-26 DIAGNOSIS — E039 Hypothyroidism, unspecified: Secondary | ICD-10-CM | POA: Diagnosis not present

## 2017-01-26 DIAGNOSIS — E7800 Pure hypercholesterolemia, unspecified: Secondary | ICD-10-CM | POA: Diagnosis not present

## 2017-01-26 DIAGNOSIS — I10 Essential (primary) hypertension: Secondary | ICD-10-CM | POA: Diagnosis not present

## 2017-01-27 DIAGNOSIS — Z01419 Encounter for gynecological examination (general) (routine) without abnormal findings: Secondary | ICD-10-CM | POA: Diagnosis not present

## 2017-01-27 DIAGNOSIS — N959 Unspecified menopausal and perimenopausal disorder: Secondary | ICD-10-CM | POA: Diagnosis not present

## 2017-02-28 ENCOUNTER — Other Ambulatory Visit: Payer: Self-pay | Admitting: Internal Medicine

## 2017-02-28 NOTE — Telephone Encounter (Signed)
REFILL 

## 2017-03-21 DIAGNOSIS — F331 Major depressive disorder, recurrent, moderate: Secondary | ICD-10-CM | POA: Diagnosis not present

## 2017-03-22 ENCOUNTER — Encounter: Payer: Self-pay | Admitting: Gastroenterology

## 2017-03-30 DIAGNOSIS — F331 Major depressive disorder, recurrent, moderate: Secondary | ICD-10-CM | POA: Diagnosis not present

## 2017-04-01 DIAGNOSIS — F411 Generalized anxiety disorder: Secondary | ICD-10-CM | POA: Diagnosis not present

## 2017-04-01 DIAGNOSIS — F331 Major depressive disorder, recurrent, moderate: Secondary | ICD-10-CM | POA: Diagnosis not present

## 2017-04-01 DIAGNOSIS — Z79899 Other long term (current) drug therapy: Secondary | ICD-10-CM | POA: Diagnosis not present

## 2017-04-22 DIAGNOSIS — F331 Major depressive disorder, recurrent, moderate: Secondary | ICD-10-CM | POA: Diagnosis not present

## 2017-04-22 DIAGNOSIS — F411 Generalized anxiety disorder: Secondary | ICD-10-CM | POA: Diagnosis not present

## 2017-04-25 ENCOUNTER — Encounter: Payer: Self-pay | Admitting: Gastroenterology

## 2017-04-25 DIAGNOSIS — L821 Other seborrheic keratosis: Secondary | ICD-10-CM | POA: Diagnosis not present

## 2017-04-25 DIAGNOSIS — L905 Scar conditions and fibrosis of skin: Secondary | ICD-10-CM | POA: Diagnosis not present

## 2017-05-16 DIAGNOSIS — J111 Influenza due to unidentified influenza virus with other respiratory manifestations: Secondary | ICD-10-CM | POA: Diagnosis not present

## 2017-05-24 DIAGNOSIS — F411 Generalized anxiety disorder: Secondary | ICD-10-CM | POA: Diagnosis not present

## 2017-05-24 DIAGNOSIS — F331 Major depressive disorder, recurrent, moderate: Secondary | ICD-10-CM | POA: Diagnosis not present

## 2017-05-25 ENCOUNTER — Other Ambulatory Visit: Payer: Self-pay

## 2017-05-25 ENCOUNTER — Ambulatory Visit (AMBULATORY_SURGERY_CENTER): Payer: Self-pay | Admitting: *Deleted

## 2017-05-25 VITALS — Ht 65.0 in | Wt 207.2 lb

## 2017-05-25 DIAGNOSIS — Z8371 Family history of colonic polyps: Secondary | ICD-10-CM

## 2017-05-25 MED ORDER — PEG-KCL-NACL-NASULF-NA ASC-C 140 G PO SOLR: 1.0000 | ORAL | 0 refills | Status: DC

## 2017-05-25 NOTE — Progress Notes (Signed)
No egg or soy allergy known to patient  No issues with past sedation with any surgeries  or procedures, no intubation problems  No diet pills per patient No home 02 use per patient  No blood thinners per patient  Pt denies issues with constipation  No A fib or A flutter  EMMI video sent to pt's e mail - pt declined   plenvu sample lot [id number] exp 09/2018 as directed -   with pt's insurance will not cover suprep or movi prep or plenvu- pt states she vomited golytely at a previous colon and she had stool through out her colon so will not do golytely- pleanvu sample given

## 2017-06-01 ENCOUNTER — Ambulatory Visit (AMBULATORY_SURGERY_CENTER): Payer: Medicare Other | Admitting: Gastroenterology

## 2017-06-01 ENCOUNTER — Other Ambulatory Visit: Payer: Self-pay

## 2017-06-01 ENCOUNTER — Encounter: Payer: Self-pay | Admitting: Gastroenterology

## 2017-06-01 VITALS — BP 120/78 | HR 66 | Temp 96.4°F | Resp 10 | Ht 65.0 in | Wt 207.0 lb

## 2017-06-01 DIAGNOSIS — Z8371 Family history of colonic polyps: Secondary | ICD-10-CM

## 2017-06-01 DIAGNOSIS — Z1211 Encounter for screening for malignant neoplasm of colon: Secondary | ICD-10-CM

## 2017-06-01 DIAGNOSIS — D123 Benign neoplasm of transverse colon: Secondary | ICD-10-CM | POA: Diagnosis not present

## 2017-06-01 DIAGNOSIS — Z86010 Personal history of colonic polyps: Secondary | ICD-10-CM | POA: Diagnosis not present

## 2017-06-01 DIAGNOSIS — I341 Nonrheumatic mitral (valve) prolapse: Secondary | ICD-10-CM | POA: Diagnosis not present

## 2017-06-01 DIAGNOSIS — I10 Essential (primary) hypertension: Secondary | ICD-10-CM | POA: Diagnosis not present

## 2017-06-01 DIAGNOSIS — J45909 Unspecified asthma, uncomplicated: Secondary | ICD-10-CM | POA: Diagnosis not present

## 2017-06-01 MED ORDER — SODIUM CHLORIDE 0.9 % IV SOLN: 500.0000 mL | Freq: Once | INTRAVENOUS | Status: DC

## 2017-06-01 NOTE — Progress Notes (Signed)
Called to room to assist during endoscopic procedure.  Patient ID and intended procedure confirmed with present staff. Received instructions for my participation in the procedure from the performing physician.  

## 2017-06-01 NOTE — Progress Notes (Signed)
Report given to PACU, vss 

## 2017-06-01 NOTE — Progress Notes (Signed)
I have reviewed the patient's medical history in detail and updated the computerized patient record.

## 2017-06-01 NOTE — Op Note (Signed)
[facility] Patient Name: Glenda Evans Procedure Date: 06/01/2017 9:16 AM MRN: [id number] Endoscopist: Ladene Artist , MD Age: 62 Referring MD:  Date of Birth: 1956/01/07 Gender: Female Account #: [id number] Procedure:                Colonoscopy Indications:              Colon cancer screening in patient at increased                            risk: Family history of 1st-degree relative with                            colon polyps Medicines:                Monitored Anesthesia Care Procedure:                Pre-Anesthesia Assessment:                           - Prior to the procedure, a History and Physical                            was performed, and patient medications and                            allergies were reviewed. The patient's tolerance of                            previous anesthesia was also reviewed. The risks                            and benefits of the procedure and the sedation                            options and risks were discussed with the patient.                            All questions were answered, and informed consent                            was obtained. Prior Anticoagulants: The patient has                            taken no previous anticoagulant or antiplatelet                            agents. ASA Grade Assessment: II - A patient with                            mild systemic disease. After reviewing the risks                            and benefits, the patient was deemed in  satisfactory condition to undergo the procedure.                           After obtaining informed consent, the colonoscope                            was passed under direct vision. Throughout the                            procedure, the patient's blood pressure, pulse, and                            oxygen saturations were monitored continuously. The                            Model PCF-H190DL [id number]) scope was  introduced                            through the anus and advanced to the the cecum,                            identified by appendiceal orifice and ileocecal                            valve. The ileocecal valve, appendiceal orifice,                            and rectum were photographed. The quality of the                            bowel preparation was excellent. The colonoscopy                            was performed without difficulty. The patient                            tolerated the procedure well. Scope In: 9:29:12 AM Scope Out: 9:46:43 AM Scope Withdrawal Time: 0 hours 13 minutes 28 seconds  Total Procedure Duration: 0 hours 17 minutes 31 seconds  Findings:                 The perianal and digital rectal examinations were                            normal.                           A 7 mm polyp was found in the transverse colon. The                            polyp was sessile. The polyp was removed with a                            cold snare. Resection and retrieval were complete.  The exam was otherwise without abnormality on                            direct and retroflexion views. Complications:            No immediate complications. Estimated blood loss:                            None. Estimated Blood Loss:     Estimated blood loss: none. Impression:               - One 7 mm polyp in the transverse colon, removed                            with a cold snare. Resected and retrieved.                           - The examination was otherwise normal on direct                            and retroflexion views. Recommendation:           - Repeat colonoscopy in 5 years for surveillance.                           - Patient has a contact number available for                            emergencies. The signs and symptoms of potential                            delayed complications were discussed with the                            patient. Return  to normal activities tomorrow.                            Written discharge instructions were provided to the                            patient.                           - Resume previous diet.                           - Continue present medications.                           - Await pathology results. Ladene Artist, MD 06/01/2017 9:49:02 AM This report has been signed electronically.

## 2017-06-01 NOTE — Patient Instructions (Signed)
**  handout given on polyps**   YOU HAD AN ENDOSCOPIC PROCEDURE TODAY: Refer to the procedure report and other information in the discharge instructions given to you for any specific questions about what was found during the examination. If this information does not answer your questions, please call [facility] office at [phone] to clarify.   YOU SHOULD EXPECT: Some feelings of bloating in the abdomen. Passage of more gas than usual. Walking can help get rid of the air that was put into your GI tract during the procedure and reduce the bloating. If you had a lower endoscopy (such as a colonoscopy or flexible sigmoidoscopy) you may notice spotting of blood in your stool or on the toilet paper. Some abdominal soreness may be present for a day or two, also.  DIET: Your first meal following the procedure should be a light meal and then it is ok to progress to your normal diet. A half-sandwich or bowl of soup is an example of a good first meal. Heavy or fried foods are harder to digest and may make you feel nauseous or bloated. Drink plenty of fluids but you should avoid alcoholic beverages for 24 hours. If you had a esophageal dilation, please see attached instructions for diet.    ACTIVITY: Your care partner should take you home directly after the procedure. You should plan to take it easy, moving slowly for the rest of the day. You can resume normal activity the day after the procedure however YOU SHOULD NOT DRIVE, use power tools, machinery or perform tasks that involve climbing or major physical exertion for 24 hours (because of the sedation medicines used during the test).   SYMPTOMS TO REPORT IMMEDIATELY: A gastroenterologist can be reached at any hour. Please call [phone]  for any of the following symptoms:  Following lower endoscopy (colonoscopy, flexible sigmoidoscopy) Excessive amounts of blood in the stool  Significant tenderness, worsening of abdominal pains  Swelling of the abdomen that  is new, acute  Fever of 100 or higher    FOLLOW UP:  If any biopsies were taken you will be contacted by phone or by letter within the next 1-3 weeks. Call [phone]  if you have not heard about the biopsies in 3 weeks.  Please also call with any specific questions about appointments or follow up tests.  

## 2017-06-02 ENCOUNTER — Telehealth: Payer: Self-pay

## 2017-06-02 NOTE — Telephone Encounter (Signed)
  Follow up Call-  Call back number 06/01/2017 10/28/2015  Post procedure Call Back phone  # [phone]  Permission to leave phone message Yes Yes  Some recent data might be hidden     Patient questions:  Do you have a fever, pain , or abdominal swelling? No. Pain Score  0 *  Have you tolerated food without any problems? Yes.    Have you been able to return to your normal activities? Yes.    Do you have any questions about your discharge instructions: Diet   No. Medications  No. Follow up visit  No.  Do you have questions or concerns about your Care? No.  Actions: * If pain score is 4 or above: No action needed, pain <4.

## 2017-06-06 DIAGNOSIS — F331 Major depressive disorder, recurrent, moderate: Secondary | ICD-10-CM | POA: Diagnosis not present

## 2017-06-06 DIAGNOSIS — F411 Generalized anxiety disorder: Secondary | ICD-10-CM | POA: Diagnosis not present

## 2017-06-12 DIAGNOSIS — M25551 Pain in right hip: Secondary | ICD-10-CM | POA: Diagnosis not present

## 2017-06-12 DIAGNOSIS — M545 Low back pain: Secondary | ICD-10-CM | POA: Diagnosis not present

## 2017-06-12 DIAGNOSIS — R4182 Altered mental status, unspecified: Secondary | ICD-10-CM | POA: Diagnosis not present

## 2017-06-12 DIAGNOSIS — E039 Hypothyroidism, unspecified: Secondary | ICD-10-CM | POA: Diagnosis not present

## 2017-06-12 DIAGNOSIS — G8929 Other chronic pain: Secondary | ICD-10-CM | POA: Diagnosis not present

## 2017-06-12 DIAGNOSIS — I10 Essential (primary) hypertension: Secondary | ICD-10-CM | POA: Diagnosis not present

## 2017-06-12 DIAGNOSIS — Z9114 Patient's other noncompliance with medication regimen: Secondary | ICD-10-CM | POA: Diagnosis not present

## 2017-06-12 DIAGNOSIS — R29810 Facial weakness: Secondary | ICD-10-CM | POA: Diagnosis not present

## 2017-06-12 DIAGNOSIS — G43909 Migraine, unspecified, not intractable, without status migrainosus: Secondary | ICD-10-CM | POA: Diagnosis not present

## 2017-06-12 DIAGNOSIS — Q211 Atrial septal defect: Secondary | ICD-10-CM | POA: Diagnosis not present

## 2017-06-12 DIAGNOSIS — R55 Syncope and collapse: Secondary | ICD-10-CM | POA: Diagnosis not present

## 2017-06-12 DIAGNOSIS — M549 Dorsalgia, unspecified: Secondary | ICD-10-CM | POA: Diagnosis not present

## 2017-06-12 DIAGNOSIS — F418 Other specified anxiety disorders: Secondary | ICD-10-CM | POA: Diagnosis not present

## 2017-06-12 DIAGNOSIS — E785 Hyperlipidemia, unspecified: Secondary | ICD-10-CM | POA: Diagnosis not present

## 2017-06-12 DIAGNOSIS — R5383 Other fatigue: Secondary | ICD-10-CM | POA: Diagnosis not present

## 2017-06-12 DIAGNOSIS — M6281 Muscle weakness (generalized): Secondary | ICD-10-CM | POA: Diagnosis not present

## 2017-06-12 DIAGNOSIS — Z86718 Personal history of other venous thrombosis and embolism: Secondary | ICD-10-CM | POA: Diagnosis not present

## 2017-06-12 DIAGNOSIS — M79671 Pain in right foot: Secondary | ICD-10-CM | POA: Diagnosis not present

## 2017-06-12 DIAGNOSIS — R531 Weakness: Secondary | ICD-10-CM | POA: Diagnosis not present

## 2017-06-12 DIAGNOSIS — M79604 Pain in right leg: Secondary | ICD-10-CM | POA: Diagnosis not present

## 2017-06-12 DIAGNOSIS — R404 Transient alteration of awareness: Secondary | ICD-10-CM | POA: Diagnosis not present

## 2017-06-12 DIAGNOSIS — I639 Cerebral infarction, unspecified: Secondary | ICD-10-CM | POA: Diagnosis not present

## 2017-06-13 DIAGNOSIS — E785 Hyperlipidemia, unspecified: Secondary | ICD-10-CM | POA: Diagnosis not present

## 2017-06-13 DIAGNOSIS — G459 Transient cerebral ischemic attack, unspecified: Secondary | ICD-10-CM | POA: Diagnosis not present

## 2017-06-13 DIAGNOSIS — M6281 Muscle weakness (generalized): Secondary | ICD-10-CM | POA: Diagnosis not present

## 2017-06-13 DIAGNOSIS — M545 Low back pain: Secondary | ICD-10-CM | POA: Diagnosis not present

## 2017-06-13 DIAGNOSIS — M549 Dorsalgia, unspecified: Secondary | ICD-10-CM | POA: Diagnosis not present

## 2017-06-13 DIAGNOSIS — Q211 Atrial septal defect: Secondary | ICD-10-CM | POA: Diagnosis not present

## 2017-06-13 DIAGNOSIS — M79671 Pain in right foot: Secondary | ICD-10-CM | POA: Diagnosis not present

## 2017-06-13 DIAGNOSIS — F418 Other specified anxiety disorders: Secondary | ICD-10-CM | POA: Diagnosis not present

## 2017-06-13 DIAGNOSIS — G8929 Other chronic pain: Secondary | ICD-10-CM | POA: Diagnosis not present

## 2017-06-13 DIAGNOSIS — E039 Hypothyroidism, unspecified: Secondary | ICD-10-CM | POA: Diagnosis not present

## 2017-06-13 DIAGNOSIS — Z9114 Patient's other noncompliance with medication regimen: Secondary | ICD-10-CM | POA: Diagnosis not present

## 2017-06-13 DIAGNOSIS — I639 Cerebral infarction, unspecified: Secondary | ICD-10-CM | POA: Diagnosis not present

## 2017-06-13 DIAGNOSIS — Z86718 Personal history of other venous thrombosis and embolism: Secondary | ICD-10-CM | POA: Diagnosis not present

## 2017-06-13 DIAGNOSIS — R404 Transient alteration of awareness: Secondary | ICD-10-CM | POA: Diagnosis not present

## 2017-06-13 DIAGNOSIS — R4182 Altered mental status, unspecified: Secondary | ICD-10-CM | POA: Diagnosis not present

## 2017-06-13 DIAGNOSIS — R55 Syncope and collapse: Secondary | ICD-10-CM | POA: Diagnosis not present

## 2017-06-13 DIAGNOSIS — R5383 Other fatigue: Secondary | ICD-10-CM | POA: Diagnosis not present

## 2017-06-13 DIAGNOSIS — I10 Essential (primary) hypertension: Secondary | ICD-10-CM | POA: Diagnosis not present

## 2017-06-13 DIAGNOSIS — M79604 Pain in right leg: Secondary | ICD-10-CM | POA: Diagnosis not present

## 2017-06-13 DIAGNOSIS — R29810 Facial weakness: Secondary | ICD-10-CM | POA: Diagnosis not present

## 2017-06-13 DIAGNOSIS — G43909 Migraine, unspecified, not intractable, without status migrainosus: Secondary | ICD-10-CM | POA: Diagnosis not present

## 2017-06-18 ENCOUNTER — Encounter: Payer: Self-pay | Admitting: Gastroenterology

## 2017-06-21 DIAGNOSIS — F331 Major depressive disorder, recurrent, moderate: Secondary | ICD-10-CM | POA: Diagnosis not present

## 2017-06-21 DIAGNOSIS — F411 Generalized anxiety disorder: Secondary | ICD-10-CM | POA: Diagnosis not present

## 2017-06-23 DIAGNOSIS — R29898 Other symptoms and signs involving the musculoskeletal system: Secondary | ICD-10-CM | POA: Diagnosis not present

## 2017-06-23 DIAGNOSIS — F411 Generalized anxiety disorder: Secondary | ICD-10-CM | POA: Diagnosis not present

## 2017-06-23 DIAGNOSIS — R55 Syncope and collapse: Secondary | ICD-10-CM | POA: Diagnosis not present

## 2017-06-23 DIAGNOSIS — I10 Essential (primary) hypertension: Secondary | ICD-10-CM | POA: Diagnosis not present

## 2017-06-23 DIAGNOSIS — F331 Major depressive disorder, recurrent, moderate: Secondary | ICD-10-CM | POA: Diagnosis not present

## 2017-06-29 ENCOUNTER — Encounter: Payer: Self-pay | Admitting: Neurology

## 2017-06-29 ENCOUNTER — Telehealth: Payer: Self-pay | Admitting: Neurology

## 2017-06-29 ENCOUNTER — Ambulatory Visit (INDEPENDENT_AMBULATORY_CARE_PROVIDER_SITE_OTHER): Payer: Medicare Other | Admitting: Neurology

## 2017-06-29 VITALS — BP 128/82 | HR 76

## 2017-06-29 DIAGNOSIS — G459 Transient cerebral ischemic attack, unspecified: Secondary | ICD-10-CM

## 2017-06-29 MED ORDER — ASPIRIN 81 MG PO TABS: 81.0000 mg | ORAL_TABLET | Freq: Every day | ORAL | 1 refills | Status: DC

## 2017-06-29 NOTE — Telephone Encounter (Signed)
Patient request for an open MRI DW faxed orders to [facility] for the open mri.

## 2017-06-29 NOTE — Patient Instructions (Addendum)
I had a long d/w patient about her recent episode of brief loss of consciousness followed by right-sided weakness being of unclear etiology possibly syncopal event versus vertebro basilar TIA, risk for recurrent stroke/TIAs, personally independently reviewed imaging studies and stroke evaluation results and answered questions.Recommend she start taking aspirin 81 mg daily  for secondary stroke prevention and maintain strict control of hypertension with blood pressure goal below 130/90, diabetes with hemoglobin A1c goal below 6.5% and lipids with LDL cholesterol goal below 70 mg/dL. I also advised the patient to eat a healthy diet with plenty of whole grains, cereals, fruits and vegetables, exercise regularly and maintain ideal body weight.check EEG, MRI scan of the brain, MRA of the brain and neck, hemoglobin A1c and lipid profile. Patient was advised not to drive for the next few months as per [facility]. Followup in the future with me in 2 months or call earlier if necessary.   Stroke Prevention Some medical conditions and behaviors are associated with a higher chance of having a stroke. You can help prevent a stroke by making nutrition, lifestyle, and other changes, including managing any medical conditions you may have. What nutrition changes can be made?  Eat healthy foods. You can do this by: ? Choosing foods high in fiber, such as fresh fruits and vegetables and whole grains. ? Eating at least 5 or more servings of fruits and vegetables a day. Try to fill half of your plate at each meal with fruits and vegetables. ? Choosing lean protein foods, such as lean cuts of meat, poultry without skin, fish, tofu, beans, and nuts. ? Eating low-fat dairy products. ? Avoiding foods that are high in salt (sodium). This can help lower blood pressure. ? Avoiding foods that have saturated fat, trans fat, and cholesterol. This can help prevent high cholesterol. ? Avoiding processed and premade  foods.  Follow your health care provider's specific guidelines for losing weight, controlling high blood pressure (hypertension), lowering high cholesterol, and managing diabetes. These may include: ? Reducing your daily calorie intake. ? Limiting your daily sodium intake to 1,500 milligrams (mg). ? Using only healthy fats for cooking, such as olive oil, canola oil, or sunflower oil. ? Counting your daily carbohydrate intake. What lifestyle changes can be made?  Maintain a healthy weight. Talk to your health care provider about your ideal weight.  Get at least 30 minutes of moderate physical activity at least 5 days a week. Moderate activity includes brisk walking, biking, and swimming.  Do not use any products that contain nicotine or tobacco, such as cigarettes and e-cigarettes. If you need help quitting, ask your health care provider. It may also be helpful to avoid exposure to secondhand smoke.  Limit alcohol intake to no more than 1 drink a day for nonpregnant women and 2 drinks a day for men. One drink equals 12 oz of beer, 5 oz of wine, or 1 oz of hard liquor.  Stop any illegal drug use.  Avoid taking birth control pills. Talk to your health care provider about the risks of taking birth control pills if: ? You are over 59 years old. ? You smoke. ? You get migraines. ? You have ever had a blood clot. What other changes can be made?  Manage your cholesterol levels. ? Eating a healthy diet is important for preventing high cholesterol. If cholesterol cannot be managed through diet alone, you may also need to take medicines. ? Take any prescribed medicines to control your cholesterol as  told by your health care provider.  Manage your diabetes. ? Eating a healthy diet and exercising regularly are important parts of managing your blood sugar. If your blood sugar cannot be managed through diet and exercise, you may need to take medicines. ? Take any prescribed medicines to control  your diabetes as told by your health care provider.  Control your hypertension. ? To reduce your risk of stroke, try to keep your blood pressure below 130/80. ? Eating a healthy diet and exercising regularly are an important part of controlling your blood pressure. If your blood pressure cannot be managed through diet and exercise, you may need to take medicines. ? Take any prescribed medicines to control hypertension as told by your health care provider. ? Ask your health care provider if you should monitor your blood pressure at home. ? Have your blood pressure checked every year, even if your blood pressure is normal. Blood pressure increases with age and some medical conditions.  Get evaluated for sleep disorders (sleep apnea). Talk to your health care provider about getting a sleep evaluation if you snore a lot or have excessive sleepiness.  Take over-the-counter and prescription medicines only as told by your health care provider. Aspirin or blood thinners (antiplatelets or anticoagulants) may be recommended to reduce your risk of forming blood clots that can lead to stroke.  Make sure that any other medical conditions you have, such as atrial fibrillation or atherosclerosis, are managed. What are the warning signs of a stroke? The warning signs of a stroke can be easily remembered as BEFAST.  B is for balance. Signs include: ? Dizziness. ? Loss of balance or coordination. ? Sudden trouble walking.  E is for eyes. Signs include: ? A sudden change in vision. ? Trouble seeing.  F is for face. Signs include: ? Sudden weakness or numbness of the face. ? The face or eyelid drooping to one side.  A is for arms. Signs include: ? Sudden weakness or numbness of the arm, usually on one side of the body.  S is for speech. Signs include: ? Trouble speaking (aphasia). ? Trouble understanding.  T is for time. ? These symptoms may represent a serious problem that is an emergency. Do not  wait to see if the symptoms will go away. Get medical help right away. Call your local emergency services (911 in the [place] Do not drive yourself to the hospital.  Other signs of stroke may include: ? A sudden, severe headache with no known cause. ? Nausea or vomiting. ? Seizure.  Where to find more information: For more information, visit:  American Stroke Association: www.strokeassociation.org  National Stroke Association: www.stroke.org  Summary  You can prevent a stroke by eating healthy, exercising, not smoking, limiting alcohol intake, and managing any medical conditions you may have.  Do not use any products that contain nicotine or tobacco, such as cigarettes and e-cigarettes. If you need help quitting, ask your health care provider. It may also be helpful to avoid exposure to secondhand smoke.  Remember BEFAST for warning signs of stroke. Get help right away if you or a loved one has any of these signs. This information is not intended to replace advice given to you by your health care provider. Make sure you discuss any questions you have with your health care provider. Document Released: 05/13/2004 Document Revised: 05/11/2016 Document Reviewed: 05/11/2016 Elsevier Interactive Patient Education  [employer].

## 2017-06-29 NOTE — Progress Notes (Signed)
[facility] [address] [place]. [place] [id number] [phone]       OFFICE CONSULT NOTE  Glenda. Glenda Evans Date of Birth:  04-Oct-1955 Medical Record Number:  [id number]   Referring MD:  Azalia Bilis Reason for Referral:  TIA HPI: Glenda Evans is 62 year old Caucasian lady seen today for initial consultation for episode of possible TIA on 06/12/17. Patient states that she was visiting [facility] hospital ICU to see her sister's son who was sick. She suddenly became unresponsive for a brief period. She does not remember until she was taken to the emergency room. There was no apparent tonic-clonic activity or tongue bite or injury noted.She is still can remember on the bits and pieces of the entire hospitalization. Apparently she had a CT scan as well as MRI scans which were unremarkable. I am unable to get access to these records through care everywhere. Patient states ever since then she's noticed some weakness of her right hand and leg. She has noticed that the fine motor skills are not good she has trouble opening cans and using the fingers at the extremity is not good. She was not referred for any physical occupational therapy is started on aspirin. She denies any prior history of strokes or TIAs. 2 of her mother's sisters had strokes. She complains now of occasional sharp chest pain on either side of his last only for a few seconds on occasion she has to grab her chest and describes a muscle cramp-like pain. She denies any prior history of migraines seizures significant head injury with loss of consciousness.  ROS:   14 system review of systems is positive for   fatigue, chest pain, murmur, trouble swallowing, blurred vision, increased thirst, hands and feet swelling, memory loss, confusion, headache, weakness, slurred speech, anxiety, depression, not enough sleep, decreased energy, insomnia and all other systems negative  PMH:  Past Medical History:  Diagnosis Date  .  Allergy   . Anxiety   . Arthritis   . Asthma   . Cataract    right eye   . Depression   . Fatigue   . Fibromyalgia   . GERD (gastroesophageal reflux disease)   . Heart murmur   . HLD (hyperlipidemia)   . Hyperglycemia   . Hypertension   . Hypothyroidism   . IBS (irritable bowel syndrome)   . Insomnia   . Migraine   . Mitral valve prolapse   . Osteopenia   . Pre-diabetes   . PVC (premature ventricular contraction)   . Stein-Leventhal syndrome   . Wears glasses     Social History:  Social History   Socioeconomic History  . Marital status: Divorced    Spouse name: Not on file  . Number of children: 2  . Years of education: Not on file  . Highest education level: Not on file  Social Needs  . Financial resource strain: Not on file  . Food insecurity - worry: Not on file  . Food insecurity - inability: Not on file  . Transportation needs - medical: Not on file  . Transportation needs - non-medical: Not on file  Occupational History  . Occupation: disabled  Tobacco Use  . Smoking status: Never Smoker  . Smokeless tobacco: Never Used  Substance and Sexual Activity  . Alcohol use: No    Alcohol/week: 0.0 oz  . Drug use: No  . Sexual activity: Not on file  Other Topics Concern  . Not on file  Social History Narrative  .  Not on file    Medications:   Current Outpatient Medications on File Prior to Visit  Medication Sig Dispense Refill  . albuterol (PROVENTIL HFA;VENTOLIN HFA) 108 (90 BASE) MCG/ACT inhaler Inhale 1 puff into the lungs every 6 (six) hours as needed for wheezing or shortness of breath.    . Black Cohosh 40 MG CAPS Take 1 capsule by mouth daily.    . clonazePAM (KLONOPIN) 0.5 MG tablet Take 0.5 mg by mouth 2 (two) times daily.  0  . clonazePAM (KLONOPIN) 1 MG tablet Take 1 mg by mouth at bedtime as needed for anxiety.   5  . diltiazem (CARDIZEM CD) 240 MG 24 hr capsule TAKE 1 CAPSULE (240 MG TOTAL) BY MOUTH DAILY. 30 capsule 6  . DULoxetine  (CYMBALTA) 30 MG capsule Take 30 mg by mouth daily.     Marland Kitchen KLOR-CON M20 20 MEQ tablet Take 20 mEq by mouth daily. with food  5  . levothyroxine (SYNTHROID, LEVOTHROID) 100 MCG tablet Take 100 mcg by mouth every morning. On an empty stomach  0  . loratadine (CLARITIN) 10 MG tablet Take 10 mg by mouth.    . losartan-hydrochlorothiazide (HYZAAR) 100-25 MG tablet TAKE 1 TABLET BY MOUTH DAILY. 30 tablet 11  . Multiple Vitamin (MULTIVITAMIN) tablet Take 1 tablet by mouth daily.    Marland Kitchen NEXIUM 40 MG capsule Take 40 mg by mouth daily.  0  . Nutritional Supplements (ESTROVEN PO) Take by mouth daily.    . Nutritional Supplements (ESTROVEN PO) Take by mouth.    . OLANZapine (ZYPREXA) 5 MG tablet TAKE 1 TABLET BY MOUTH EVERYDAY AT BEDTIME  0  . ondansetron (ZOFRAN) 4 MG tablet TAKE 1 OR 2 TABLETS BY MOUTH EVERY 6 HOURS AS NEEDED  0  . PEG-KCl-NaCl-NaSulf-Na Asc-C (PLENVU) 140 g SOLR Take 1 kit by mouth as directed. 1 each 0  . Vitamin D, Ergocalciferol, (DRISDOL) 50000 UNITS CAPS capsule Take 50,000 Units by mouth every 7 (seven) days.    . DULoxetine (CYMBALTA) 60 MG capsule Take 60 mg by mouth 2 (two) times daily.  3   Current Facility-Administered Medications on File Prior to Visit  Medication Dose Route Frequency Provider Last Rate Last Dose  . triamcinolone acetonide (KENALOG) 10 MG/ML injection 10 mg  10 mg Other Once Landis Martins, DPM        Allergies:   Allergies  Allergen Reactions  . Macrobid [Nitrofurantoin Macrocrystal] Swelling  . Metaxalone Shortness Of Breath  . Sulfasalazine Shortness Of Breath  . Augmentin [Amoxicillin-Pot Clavulanate]   . Erythromycin   . Escitalopram Oxalate   . Lyrica [Pregabalin]   . Neurontin [Gabapentin]   . Ofloxacin   . Pamelor   . Relafen [Nabumetone]   . Septra [Bactrim]   . Skelaxin   . Sulfa Antibiotics     Physical Exam General: well developed, well nourished middle-aged Caucasian lady, seated, in no evident distress Head: head normocephalic  and atraumatic.   Neck: supple with no carotid or supraclavicular bruits Cardiovascular: regular rate and rhythm, no murmurs Musculoskeletal: no deformity Skin:  no rash/petichiae Vascular:  Normal pulses all extremities  Neurologic Exam Mental Status: Awake and fully alert. Oriented to place and time. Recent and remote memory intact. Attention span, concentration and fund of knowledge appropriate. Mood and affect appropriate.  Cranial Nerves: Fundoscopic exam reveals sharp disc margins. Pupils equal, briskly reactive to light. Extraocular movements full without nystagmus. Visual fields full to confrontation. Hearing intact. Facial sensation intact. Face, tongue, palate  moves normally and symmetrically.  Motor: Normal bulk and tone. Normal strength in all tested extremity muscles except mild weakness in the right grip likely due to giveaway.Slight diminished fine finger movements on the right.. Sensory.: intact to touch , pinprick , position and vibratory sensation.  Coordination: Rapid alternating movements normal in all extremities. Finger-to-nose and heel-to-shin performed accurately bilaterally. Gait and Station: Arises from chair without difficulty. Stance is normal. Gait demonstrates normal stride length and balance . Able to heel, toe and tandem walk without difficulty.  Reflexes: 1+ and symmetric. Toes downgoing.   NIHSS 0 Modified Rankin  1   ASSESSMENT: 62 year old lady with transient episode of brief loss of consciousness followed by right-sided weakness possibly small left brain infarct on vertebrobasilar TIA not seen on imaging. On 06/12/17. Unfortunately I was not able to get her hospital records from that admission at [facility] for review today.vascular risk factors of hypertension and hyperlipidemia   PLAN: I had a long d/w patient about her recent episode of brief loss of consciousness followed by right-sided weakness being of unclear etiology possibly syncopal event versus  vertebro basilar TIA, risk for recurrent stroke/TIAs, personally independently reviewed imaging studies and stroke evaluation results and answered questions.Recommend she start taking aspirin 81 mg daily  for secondary stroke prevention and maintain strict control of hypertension with blood pressure goal below 130/90, diabetes with hemoglobin A1c goal below 6.5% and lipids with LDL cholesterol goal below 70 mg/dL. I also advised the patient to eat a healthy diet with plenty of whole grains, cereals, fruits and vegetables, exercise regularly and maintain ideal body weight.check EEG, MRI scan of the brain, MRA of the brain and neck, hemoglobin A1c and lipid profile. Patient was advised not to drive for the next few months as per [facility].greater than 50% time during this 45 minute consultation visit was spent on counseling and coordination of care about stroke and TIA and answering questions Followup in the future with me in 2 months or call earlier if necessary. Antony Contras, MD  [facility] [address] [place] , [place] [address]  Phone [phone] Fax [phone] Note: This document was prepared with digital dictation and possible smart phrase technology. Any transcriptional errors that result from this process are unintentional.

## 2017-06-30 LAB — LIPID PANEL
CHOL/HDL RATIO: 4.5 ratio — AB (ref 0.0–4.4)
Cholesterol, Total: 184 mg/dL (ref 100–199)
HDL: 41 mg/dL (ref >39)
LDL Calculated: 91 mg/dL (ref 0–99)
TRIGLYCERIDES: 260 mg/dL — AB (ref 0–149)
VLDL CHOLESTEROL CAL: 52 mg/dL — AB (ref 5–40)

## 2017-06-30 LAB — HEMOGLOBIN A1C
Est. average glucose Bld gHb Est-mCnc: 120 mg/dL
HEMOGLOBIN A1C: 5.8 % — AB (ref 4.8–5.6)

## 2017-07-01 ENCOUNTER — Telehealth: Payer: Self-pay | Admitting: Neurology

## 2017-07-01 ENCOUNTER — Other Ambulatory Visit: Payer: Self-pay

## 2017-07-01 MED ORDER — ALPRAZOLAM 0.5 MG PO TABS: 0.5000 mg | ORAL_TABLET | Freq: Three times a day (TID) | ORAL | 0 refills | Status: DC | PRN

## 2017-07-01 NOTE — Telephone Encounter (Signed)
Pt called she is claustrophobic and will need something to take prior to MRI/MRA on 3/20. Pharmacy: [facility]

## 2017-07-01 NOTE — Telephone Encounter (Signed)
Patient is having a open MRI and MRA at [facility] Pt takes klonopin for anxiety per her last visit,and medication list. Rn fax a rx for xanax of 3 pills and no refills only for testing. Rx fax xanax twice and it was confirmed via fax.

## 2017-07-04 NOTE — Telephone Encounter (Signed)
Pt chart opened in error.

## 2017-07-05 DIAGNOSIS — J0100 Acute maxillary sinusitis, unspecified: Secondary | ICD-10-CM | POA: Diagnosis not present

## 2017-07-06 ENCOUNTER — Telehealth: Payer: Self-pay

## 2017-07-06 DIAGNOSIS — G459 Transient cerebral ischemic attack, unspecified: Secondary | ICD-10-CM | POA: Diagnosis not present

## 2017-07-06 NOTE — Telephone Encounter (Signed)
-----   Message from Garvin Fila, MD sent at 07/05/2017  4:34 PM EDT ----- Glenda Evans inform the patient that her triglycerides and bad cholesterol are elevated but screening blood work for diabetes is satisfactory. Advise her to see a primary physician Dr. Shirline Frees for cholesterol treatment

## 2017-07-06 NOTE — Telephone Encounter (Signed)
Pt returned RN's call °

## 2017-07-06 NOTE — Telephone Encounter (Signed)
Notes recorded by Marval Regal, RN on 07/06/2017 at 9:02 AM EDT LEft vm with family member for patient to call back about lab work results.

## 2017-07-07 NOTE — Telephone Encounter (Signed)
Rn call patient that her lipid panel results are elevated. Her triglycerides and bad cholesterol are bad. Her diabetes is satisfactory.Please see your primary doctor for cholesterol treatment. Rn recommend pt call her PCP, and schedule an appt to be evaluated for a statin medication. Rn stated to pt the lab report will be fax. PT verbalized understanding. ------

## 2017-07-11 ENCOUNTER — Telehealth: Payer: Self-pay | Admitting: *Deleted

## 2017-07-11 NOTE — Telephone Encounter (Signed)
I have reviewed the MRI/ MRA  films on the disc from [facility] done on 07/06/17 and agree with the interpretation that brain MRI shows only mild age related changes of chronic microvascular ischemia and MRA of neck and brain show no major stenosis of large vessels. Kindly inform the patient of the above results

## 2017-07-11 NOTE — Telephone Encounter (Signed)
Patient stopped but the office to drop off MRI/MRA CD with report.  Wants a call back to discuss please.

## 2017-07-11 NOTE — Telephone Encounter (Signed)
CD with report placed on RN's desk

## 2017-07-11 NOTE — Telephone Encounter (Signed)
Cd and report given to Dr. Leonie Man.

## 2017-07-12 NOTE — Telephone Encounter (Signed)
Rn call patient about her 3 scans she had done at [facility]. Rn stated per Dr. Leonie Man note the   MRI/ MRA  films on the disc from [place] [facility] done on 07/06/17 and agree with the interpretation that brain MRI shows only mild age related changes of chronic microvascular ischemia and MRA of neck and brain show no major stenosis of large vessels. Kindly inform the patient of the above results.Tje patient verbalized understanding.

## 2017-07-18 ENCOUNTER — Other Ambulatory Visit: Payer: Self-pay | Admitting: Obstetrics and Gynecology

## 2017-07-18 DIAGNOSIS — Z1231 Encounter for screening mammogram for malignant neoplasm of breast: Secondary | ICD-10-CM

## 2017-07-21 DIAGNOSIS — F411 Generalized anxiety disorder: Secondary | ICD-10-CM | POA: Diagnosis not present

## 2017-07-21 DIAGNOSIS — F331 Major depressive disorder, recurrent, moderate: Secondary | ICD-10-CM | POA: Diagnosis not present

## 2017-07-22 DIAGNOSIS — M25572 Pain in left ankle and joints of left foot: Secondary | ICD-10-CM | POA: Diagnosis not present

## 2017-07-22 DIAGNOSIS — M25571 Pain in right ankle and joints of right foot: Secondary | ICD-10-CM | POA: Diagnosis not present

## 2017-07-23 ENCOUNTER — Other Ambulatory Visit: Payer: Self-pay | Admitting: Internal Medicine

## 2017-07-25 ENCOUNTER — Other Ambulatory Visit: Payer: Medicare Other

## 2017-08-02 DIAGNOSIS — R7303 Prediabetes: Secondary | ICD-10-CM | POA: Diagnosis not present

## 2017-08-02 DIAGNOSIS — E876 Hypokalemia: Secondary | ICD-10-CM | POA: Diagnosis not present

## 2017-08-02 DIAGNOSIS — J029 Acute pharyngitis, unspecified: Secondary | ICD-10-CM | POA: Diagnosis not present

## 2017-08-02 DIAGNOSIS — F5101 Primary insomnia: Secondary | ICD-10-CM | POA: Diagnosis not present

## 2017-08-02 DIAGNOSIS — I10 Essential (primary) hypertension: Secondary | ICD-10-CM | POA: Diagnosis not present

## 2017-08-02 DIAGNOSIS — K219 Gastro-esophageal reflux disease without esophagitis: Secondary | ICD-10-CM | POA: Diagnosis not present

## 2017-08-02 DIAGNOSIS — M797 Fibromyalgia: Secondary | ICD-10-CM | POA: Diagnosis not present

## 2017-08-02 DIAGNOSIS — J4520 Mild intermittent asthma, uncomplicated: Secondary | ICD-10-CM | POA: Diagnosis not present

## 2017-08-02 DIAGNOSIS — E7800 Pure hypercholesterolemia, unspecified: Secondary | ICD-10-CM | POA: Diagnosis not present

## 2017-08-02 DIAGNOSIS — F419 Anxiety disorder, unspecified: Secondary | ICD-10-CM | POA: Diagnosis not present

## 2017-08-02 DIAGNOSIS — F324 Major depressive disorder, single episode, in partial remission: Secondary | ICD-10-CM | POA: Diagnosis not present

## 2017-08-09 DIAGNOSIS — F331 Major depressive disorder, recurrent, moderate: Secondary | ICD-10-CM | POA: Diagnosis not present

## 2017-08-09 DIAGNOSIS — F411 Generalized anxiety disorder: Secondary | ICD-10-CM | POA: Diagnosis not present

## 2017-08-17 ENCOUNTER — Ambulatory Visit: Payer: Medicare Other

## 2017-08-18 ENCOUNTER — Ambulatory Visit (INDEPENDENT_AMBULATORY_CARE_PROVIDER_SITE_OTHER): Payer: Medicare Other

## 2017-08-18 DIAGNOSIS — R55 Syncope and collapse: Secondary | ICD-10-CM

## 2017-08-18 DIAGNOSIS — G459 Transient cerebral ischemic attack, unspecified: Secondary | ICD-10-CM

## 2017-08-19 ENCOUNTER — Ambulatory Visit
Admission: RE | Admit: 2017-08-19 | Discharge: 2017-08-19 | Disposition: A | Discharge status: Home / Self Care | Payer: Medicare Other | Source: Ambulatory Visit | Attending: Obstetrics and Gynecology | Admitting: Obstetrics and Gynecology

## 2017-08-19 ENCOUNTER — Ambulatory Visit: Payer: Medicare Other

## 2017-08-19 DIAGNOSIS — Z1231 Encounter for screening mammogram for malignant neoplasm of breast: Secondary | ICD-10-CM

## 2017-08-19 DIAGNOSIS — M25571 Pain in right ankle and joints of right foot: Secondary | ICD-10-CM | POA: Diagnosis not present

## 2017-08-19 DIAGNOSIS — M25572 Pain in left ankle and joints of left foot: Secondary | ICD-10-CM | POA: Diagnosis not present

## 2017-08-24 ENCOUNTER — Other Ambulatory Visit: Payer: Self-pay | Admitting: Neurology

## 2017-08-25 ENCOUNTER — Telehealth: Payer: Self-pay

## 2017-08-25 NOTE — Telephone Encounter (Signed)
Rn call patient about getting a refill request for aspirin. Rn stated it was denied, and she can buy aspirin 81 or 325 over the counter.PT verbalized understanding.

## 2017-08-25 NOTE — Telephone Encounter (Signed)
-----   Message from Garvin Fila, MD sent at 08/23/2017  6:41 AM EDT ----- Mitchell Heir inform patient that EEG was normal

## 2017-08-25 NOTE — Telephone Encounter (Signed)
Notes recorded by Marval Regal, RN on 08/25/2017 at 11:47 AM EDT Rn call patient that the EEG was normal. Pt verbalized understanding. ------

## 2017-08-27 DIAGNOSIS — M25571 Pain in right ankle and joints of right foot: Secondary | ICD-10-CM | POA: Diagnosis not present

## 2017-08-27 DIAGNOSIS — M25579 Pain in unspecified ankle and joints of unspecified foot: Secondary | ICD-10-CM | POA: Diagnosis not present

## 2017-08-30 DIAGNOSIS — M25579 Pain in unspecified ankle and joints of unspecified foot: Secondary | ICD-10-CM | POA: Diagnosis not present

## 2017-08-30 DIAGNOSIS — M25572 Pain in left ankle and joints of left foot: Secondary | ICD-10-CM | POA: Diagnosis not present

## 2017-09-01 DIAGNOSIS — H25811 Combined forms of age-related cataract, right eye: Secondary | ICD-10-CM | POA: Diagnosis not present

## 2017-09-05 DIAGNOSIS — M7672 Peroneal tendinitis, left leg: Secondary | ICD-10-CM | POA: Diagnosis not present

## 2017-09-05 DIAGNOSIS — M25572 Pain in left ankle and joints of left foot: Secondary | ICD-10-CM | POA: Diagnosis not present

## 2017-09-19 ENCOUNTER — Encounter: Payer: Self-pay | Admitting: Neurology

## 2017-09-19 ENCOUNTER — Ambulatory Visit (INDEPENDENT_AMBULATORY_CARE_PROVIDER_SITE_OTHER): Payer: Medicare Other | Admitting: Neurology

## 2017-09-19 VITALS — BP 119/79 | HR 73 | Ht 65.0 in | Wt 224.4 lb

## 2017-09-19 DIAGNOSIS — G459 Transient cerebral ischemic attack, unspecified: Secondary | ICD-10-CM

## 2017-09-19 MED ORDER — ATORVASTATIN CALCIUM 20 MG PO TABS: 20.0000 mg | ORAL_TABLET | Freq: Every day | ORAL | 3 refills | Status: DC

## 2017-09-19 NOTE — Patient Instructions (Signed)
I had a long d/w patient about her recent TIA, rsk for recurrent stroke/TIAs, personally independently reviewed imaging studies and stroke evaluation results and answered questions.Continue aspirin 81 mg daily  for secondary stroke prevention and maintain strict control of hypertension with blood pressure goal below 130/90, diabetes with hemoglobin A1c goal below 6.5% and lipids with LDL cholesterol goal below 70 mg/dL.Add Lipitor 20 mg daily. I also advised the patient to eat a healthy diet with plenty of whole grains, cereals, fruits and vegetables, exercise regularly and maintain ideal body weight Followup in the future with me only as needed   Stroke Prevention Some medical conditions and behaviors are associated with a higher chance of having a stroke. You can help prevent a stroke by making nutrition, lifestyle, and other changes, including managing any medical conditions you may have. What nutrition changes can be made?  Eat healthy foods. You can do this by: ? Choosing foods high in fiber, such as fresh fruits and vegetables and whole grains. ? Eating at least 5 or more servings of fruits and vegetables a day. Try to fill half of your plate at each meal with fruits and vegetables. ? Choosing lean protein foods, such as lean cuts of meat, poultry without skin, fish, tofu, beans, and nuts. ? Eating low-fat dairy products. ? Avoiding foods that are high in salt (sodium). This can help lower blood pressure. ? Avoiding foods that have saturated fat, trans fat, and cholesterol. This can help prevent high cholesterol. ? Avoiding processed and premade foods.  Follow your health care provider's specific guidelines for losing weight, controlling high blood pressure (hypertension), lowering high cholesterol, and managing diabetes. These may include: ? Reducing your daily calorie intake. ? Limiting your daily sodium intake to 1,500 milligrams (mg). ? Using only healthy fats for cooking, such as olive  oil, canola oil, or sunflower oil. ? Counting your daily carbohydrate intake. What lifestyle changes can be made?  Maintain a healthy weight. Talk to your health care provider about your ideal weight.  Get at least 30 minutes of moderate physical activity at least 5 days a week. Moderate activity includes brisk walking, biking, and swimming.  Do not use any products that contain nicotine or tobacco, such as cigarettes and e-cigarettes. If you need help quitting, ask your health care provider. It may also be helpful to avoid exposure to secondhand smoke.  Limit alcohol intake to no more than 1 drink a day for nonpregnant women and 2 drinks a day for men. One drink equals 12 oz of beer, 5 oz of wine, or 1 oz of hard liquor.  Stop any illegal drug use.  Avoid taking birth control pills. Talk to your health care provider about the risks of taking birth control pills if: ? You are over 74 years old. ? You smoke. ? You get migraines. ? You have ever had a blood clot. What other changes can be made?  Manage your cholesterol levels. ? Eating a healthy diet is important for preventing high cholesterol. If cholesterol cannot be managed through diet alone, you may also need to take medicines. ? Take any prescribed medicines to control your cholesterol as told by your health care provider.  Manage your diabetes. ? Eating a healthy diet and exercising regularly are important parts of managing your blood sugar. If your blood sugar cannot be managed through diet and exercise, you may need to take medicines. ? Take any prescribed medicines to control your diabetes as told by your health  care provider.  Control your hypertension. ? To reduce your risk of stroke, try to keep your blood pressure below 130/80. ? Eating a healthy diet and exercising regularly are an important part of controlling your blood pressure. If your blood pressure cannot be managed through diet and exercise, you may need to take  medicines. ? Take any prescribed medicines to control hypertension as told by your health care provider. ? Ask your health care provider if you should monitor your blood pressure at home. ? Have your blood pressure checked every year, even if your blood pressure is normal. Blood pressure increases with age and some medical conditions.  Get evaluated for sleep disorders (sleep apnea). Talk to your health care provider about getting a sleep evaluation if you snore a lot or have excessive sleepiness.  Take over-the-counter and prescription medicines only as told by your health care provider. Aspirin or blood thinners (antiplatelets or anticoagulants) may be recommended to reduce your risk of forming blood clots that can lead to stroke.  Make sure that any other medical conditions you have, such as atrial fibrillation or atherosclerosis, are managed. What are the warning signs of a stroke? The warning signs of a stroke can be easily remembered as BEFAST.  B is for balance. Signs include: ? Dizziness. ? Loss of balance or coordination. ? Sudden trouble walking.  E is for eyes. Signs include: ? A sudden change in vision. ? Trouble seeing.  F is for face. Signs include: ? Sudden weakness or numbness of the face. ? The face or eyelid drooping to one side.  A is for arms. Signs include: ? Sudden weakness or numbness of the arm, usually on one side of the body.  S is for speech. Signs include: ? Trouble speaking (aphasia). ? Trouble understanding.  T is for time. ? These symptoms may represent a serious problem that is an emergency. Do not wait to see if the symptoms will go away. Get medical help right away. Call your local emergency services (911 in the [place] Do not drive yourself to the hospital.  Other signs of stroke may include: ? A sudden, severe headache with no known cause. ? Nausea or vomiting. ? Seizure.  Where to find more information: For more information,  visit:  American Stroke Association: www.strokeassociation.org  National Stroke Association: www.stroke.org  Summary  You can prevent a stroke by eating healthy, exercising, not smoking, limiting alcohol intake, and managing any medical conditions you may have.  Do not use any products that contain nicotine or tobacco, such as cigarettes and e-cigarettes. If you need help quitting, ask your health care provider. It may also be helpful to avoid exposure to secondhand smoke.  Remember BEFAST for warning signs of stroke. Get help right away if you or a loved one has any of these signs. This information is not intended to replace advice given to you by your health care provider. Make sure you discuss any questions you have with your health care provider. Document Released: 05/13/2004 Document Revised: 05/11/2016 Document Reviewed: 05/11/2016 Elsevier Interactive Patient Education  [employer].

## 2017-09-19 NOTE — Progress Notes (Signed)
[facility] [address] [place]. [place] [address] [phone]       OFFICE CONSULT NOTE  Glenda. Glenda Evans Date of Birth:  May 09, 1955 Medical Record Number:  [id number]   Referring MD:  Azalia Bilis Reason for Referral:  TIA HPI: Initial visit 06/29/2017 Glenda Evans is 62 year old Caucasian lady seen today for initial consultation for episode of possible TIA on 06/12/17. Patient states that she was visiting [facility] hospital ICU to see her sister's son who was sick. She suddenly became unresponsive for a brief period. She does not remember until she was taken to the emergency room. There was no apparent tonic-clonic activity or tongue bite or injury noted.She is still can remember on the bits and pieces of the entire hospitalization. Apparently she had a CT scan as well as MRI scans which were unremarkable. I am unable to get access to these records through care everywhere. Patient states ever since then she's noticed some weakness of her right hand and leg. She has noticed that the fine motor skills are not good she has trouble opening cans and using the fingers at the extremity is not good. She was not referred for any physical occupational therapy is started on aspirin. She denies any prior history of strokes or TIAs. 2 of her mother's sisters had strokes. She complains now of occasional sharp chest pain on either side of his last only for a few seconds on occasion she has to grab her chest and describes a muscle cramp-like pain. She denies any prior history of migraines seizures significant head injury with loss of consciousness. Update 09/19/2017.  She returns for follow-up after last visit 2-1/2 months ago.  She states she has had no recurrent TIA or strokelike symptoms.  Patient for some reason did not undergo MRI as it had requested however I have managed to look at the MRI report from [facility] in care everywhere which she had earlier and the brain MRI shows no acute  abnormality with only mild changes of chronic small vessel disease.  MRA of the neck shows no large vessel stenosis or occlusion.  She had lab work done on 06/29/2017 which showed elevated LDL of 91 mg percent and hemoglobin A1c was 5.8.  Patient states that about 6 weeks ago she had somewhat similar but milder episode.  She was visiting the hospital with a fianc who had his pacemaker adjusted.  She was walking down the hall and she felt unwell but was able to sit down.  She felt weak all over and was trembling.  She felt she was about to faint but managed to avoid doing so.  She has known history of hyperlipidemia in the past was taking WelChol but first some reason she is not on a statin. ROS:   14 system review of systems is positive for    appetite change, fatigue, eye itching, excessive thirst, insomnia, depression, nervousness, anxiety nd all other systems negative  PMH:  Past Medical History:  Diagnosis Date  . Allergy   . Anxiety   . Arthritis   . Asthma   . Cataract    right eye   . Depression   . Fatigue   . Fibromyalgia   . GERD (gastroesophageal reflux disease)   . Heart murmur   . HLD (hyperlipidemia)   . Hyperglycemia   . Hypertension   . Hypothyroidism   . IBS (irritable bowel syndrome)   . Insomnia   . Migraine   . Mitral valve prolapse   .  Osteopenia   . Pre-diabetes   . PVC (premature ventricular contraction)   . Stein-Leventhal syndrome   . Wears glasses     Social History:  Social History   Socioeconomic History  . Marital status: Divorced    Spouse name: Not on file  . Number of children: 2  . Years of education: Not on file  . Highest education level: Not on file  Occupational History  . Occupation: disabled  Social Needs  . Financial resource strain: Not on file  . Food insecurity:    Worry: Not on file    Inability: Not on file  . Transportation needs:    Medical: Not on file    Non-medical: Not on file  Tobacco Use  . Smoking status: Never  Smoker  . Smokeless tobacco: Never Used  Substance and Sexual Activity  . Alcohol use: No    Alcohol/week: 0.0 oz  . Drug use: No  . Sexual activity: Not on file  Lifestyle  . Physical activity:    Days per week: Not on file    Minutes per session: Not on file  . Stress: Not on file  Relationships  . Social connections:    Talks on phone: Not on file    Gets together: Not on file    Attends religious service: Not on file    Active member of club or organization: Not on file    Attends meetings of clubs or organizations: Not on file    Relationship status: Not on file  . Intimate partner violence:    Fear of current or ex partner: Not on file    Emotionally abused: Not on file    Physically abused: Not on file    Forced sexual activity: Not on file  Other Topics Concern  . Not on file  Social History Narrative  . Not on file    Medications:   Current Outpatient Medications on File Prior to Visit  Medication Sig Dispense Refill  . albuterol (PROVENTIL HFA;VENTOLIN HFA) 108 (90 BASE) MCG/ACT inhaler Inhale 1 puff into the lungs every 6 (six) hours as needed for wheezing or shortness of breath.    . ALPRAZolam (XANAX) 0.5 MG tablet Take 1 tablet (0.5 mg total) by mouth 3 (three) times daily as needed for anxiety. 3 tablet 0  . aspirin 81 MG tablet Take 1 tablet (81 mg total) by mouth daily. 30 tablet 1  . Black Cohosh 40 MG CAPS Take 1 capsule by mouth daily.    . clonazePAM (KLONOPIN) 0.5 MG tablet Take 0.5 mg by mouth 2 (two) times daily.  0  . diltiazem (CARDIZEM CD) 240 MG 24 hr capsule TAKE 1 CAPSULE (240 MG TOTAL) BY MOUTH DAILY. 30 capsule 6  . DULoxetine (CYMBALTA) 60 MG capsule Take 60 mg by mouth 2 (two) times daily.  3  . KLOR-CON M20 20 MEQ tablet Take 20 mEq by mouth daily. with food  5  . levothyroxine (SYNTHROID, LEVOTHROID) 100 MCG tablet Take 100 mcg by mouth every morning. On an empty stomach  0  . loratadine (CLARITIN) 10 MG tablet Take 10 mg by mouth.    .  losartan-hydrochlorothiazide (HYZAAR) 100-25 MG tablet TAKE 1 TABLET BY MOUTH DAILY. 30 tablet 11  . Multiple Vitamin (MULTIVITAMIN) tablet Take 1 tablet by mouth daily.    Marland Kitchen NEXIUM 40 MG capsule Take 40 mg by mouth daily.  0  . Nutritional Supplements (ESTROVEN PO) Take by mouth daily.    Marland Kitchen  Nutritional Supplements (ESTROVEN PO) Take by mouth.    . OLANZapine (ZYPREXA) 5 MG tablet TAKE 1 TABLET BY MOUTH EVERYDAY AT BEDTIME  0  . Vitamin D, Ergocalciferol, (DRISDOL) 50000 UNITS CAPS capsule Take 50,000 Units by mouth every 7 (seven) days.     Current Facility-Administered Medications on File Prior to Visit  Medication Dose Route Frequency Provider Last Rate Last Dose  . triamcinolone acetonide (KENALOG) 10 MG/ML injection 10 mg  10 mg Other Once Landis Martins, DPM        Allergies:   Allergies  Allergen Reactions  . Macrobid [Nitrofurantoin Macrocrystal] Swelling  . Metaxalone Shortness Of Breath  . Sulfasalazine Shortness Of Breath  . Augmentin [Amoxicillin-Pot Clavulanate]   . Erythromycin   . Escitalopram Oxalate   . Lyrica [Pregabalin]   . Neurontin [Gabapentin]   . Ofloxacin   . Pamelor   . Relafen [Nabumetone]   . Septra [Bactrim]   . Skelaxin   . Sulfa Antibiotics     Physical Exam General: well developed, well nourished middle-aged Caucasian lady, seated, in no evident distress Head: head normocephalic and atraumatic.   Neck: supple with no carotid or supraclavicular bruits Cardiovascular: regular rate and rhythm, no murmurs Musculoskeletal: no deformity Skin:  no rash/petichiae Vascular:  Normal pulses all extremities  Neurologic Exam Mental Status: Awake and fully alert. Oriented to place and time. Recent and remote memory intact. Attention span, concentration and fund of knowledge appropriate. Mood and affect appropriate.  Cranial Nerves: Fundoscopic exam reveals sharp disc margins. Pupils equal, briskly reactive to light. Extraocular movements full without  nystagmus. Visual fields full to confrontation. Hearing intact. Facial sensation intact. Face, tongue, palate moves normally and symmetrically.  Motor: Normal bulk and tone. Normal strength in all tested extremity muscles except mild weakness in the right grip likely due to giveaway.Slight diminished fine finger movements on the right.. Sensory.: intact to touch , pinprick , position and vibratory sensation.  Coordination: Rapid alternating movements normal in all extremities. Finger-to-nose and heel-to-shin performed accurately bilaterally. Gait and Station: Arises from chair without difficulty. Stance is normal. Gait demonstrates normal stride length and balance . Able to heel, toe and tandem walk with somet difficulty.  Reflexes: 1+ and symmetric. Toes downgoing.   NIHSS 0 Modified Rankin  1   ASSESSMENT: 62 year old lady with transient episode of brief loss of consciousness followed by right-sided weakness possibly small left brain infarct on vertebrobasilar TIA not seen on imaging. On 06/12/17.   PLAN: I had a long d/w patient about her recent TIA, rsk for recurrent stroke/TIAs, personally independently reviewed imaging studies and stroke evaluation results and answered questions.Continue aspirin 81 mg daily  for secondary stroke prevention and maintain strict control of hypertension with blood pressure goal below 130/90, diabetes with hemoglobin A1c goal below 6.5% and lipids with LDL cholesterol goal below 70 mg/dL.Add Lipitor 20 mg daily. I also advised the patient to eat a healthy diet with plenty of whole grains, cereals, fruits and vegetables, exercise regularly and maintain ideal body weight Followup in the future with me only as needed.Greater than 50% time during this 45 minute consultation visit was spent on counseling and coordination of care about stroke and TIA and answering questions   Antony Contras, MD  [facility] [address] [place],   [address]  Phone [phone] Fax [phone] Note: This document was prepared with digital dictation and possible smart phrase technology. Any transcriptional errors that result from this process are unintentional.

## 2017-09-20 DIAGNOSIS — M79672 Pain in left foot: Secondary | ICD-10-CM | POA: Diagnosis not present

## 2017-09-20 DIAGNOSIS — M79671 Pain in right foot: Secondary | ICD-10-CM | POA: Diagnosis not present

## 2017-09-22 DIAGNOSIS — M79671 Pain in right foot: Secondary | ICD-10-CM | POA: Diagnosis not present

## 2017-09-22 DIAGNOSIS — M79672 Pain in left foot: Secondary | ICD-10-CM | POA: Diagnosis not present

## 2017-09-27 DIAGNOSIS — M79672 Pain in left foot: Secondary | ICD-10-CM | POA: Diagnosis not present

## 2017-09-27 DIAGNOSIS — M79671 Pain in right foot: Secondary | ICD-10-CM | POA: Diagnosis not present

## 2017-09-29 DIAGNOSIS — M79671 Pain in right foot: Secondary | ICD-10-CM | POA: Diagnosis not present

## 2017-09-29 DIAGNOSIS — M79672 Pain in left foot: Secondary | ICD-10-CM | POA: Diagnosis not present

## 2017-10-03 DIAGNOSIS — M79672 Pain in left foot: Secondary | ICD-10-CM | POA: Diagnosis not present

## 2017-10-03 DIAGNOSIS — M79671 Pain in right foot: Secondary | ICD-10-CM | POA: Diagnosis not present

## 2017-10-06 DIAGNOSIS — M79672 Pain in left foot: Secondary | ICD-10-CM | POA: Diagnosis not present

## 2017-10-06 DIAGNOSIS — M79671 Pain in right foot: Secondary | ICD-10-CM | POA: Diagnosis not present

## 2017-10-13 DIAGNOSIS — M79671 Pain in right foot: Secondary | ICD-10-CM | POA: Diagnosis not present

## 2017-10-13 DIAGNOSIS — M79672 Pain in left foot: Secondary | ICD-10-CM | POA: Diagnosis not present

## 2017-10-17 DIAGNOSIS — M79671 Pain in right foot: Secondary | ICD-10-CM | POA: Diagnosis not present

## 2017-10-17 DIAGNOSIS — F411 Generalized anxiety disorder: Secondary | ICD-10-CM | POA: Diagnosis not present

## 2017-10-17 DIAGNOSIS — M79672 Pain in left foot: Secondary | ICD-10-CM | POA: Diagnosis not present

## 2017-10-17 DIAGNOSIS — F330 Major depressive disorder, recurrent, mild: Secondary | ICD-10-CM | POA: Diagnosis not present

## 2017-10-19 DIAGNOSIS — M79672 Pain in left foot: Secondary | ICD-10-CM | POA: Diagnosis not present

## 2017-10-19 DIAGNOSIS — M79671 Pain in right foot: Secondary | ICD-10-CM | POA: Diagnosis not present

## 2017-10-24 DIAGNOSIS — M79671 Pain in right foot: Secondary | ICD-10-CM | POA: Diagnosis not present

## 2017-10-24 DIAGNOSIS — M79672 Pain in left foot: Secondary | ICD-10-CM | POA: Diagnosis not present

## 2017-10-31 DIAGNOSIS — M79671 Pain in right foot: Secondary | ICD-10-CM | POA: Diagnosis not present

## 2017-10-31 DIAGNOSIS — M79672 Pain in left foot: Secondary | ICD-10-CM | POA: Diagnosis not present

## 2017-11-03 DIAGNOSIS — M79671 Pain in right foot: Secondary | ICD-10-CM | POA: Diagnosis not present

## 2017-11-03 DIAGNOSIS — M79672 Pain in left foot: Secondary | ICD-10-CM | POA: Diagnosis not present

## 2017-11-07 DIAGNOSIS — M79672 Pain in left foot: Secondary | ICD-10-CM | POA: Diagnosis not present

## 2017-11-07 DIAGNOSIS — M79671 Pain in right foot: Secondary | ICD-10-CM | POA: Diagnosis not present

## 2017-11-09 DIAGNOSIS — D1039 Benign neoplasm of other parts of mouth: Secondary | ICD-10-CM | POA: Diagnosis not present

## 2017-11-11 DIAGNOSIS — M79672 Pain in left foot: Secondary | ICD-10-CM | POA: Diagnosis not present

## 2017-11-11 DIAGNOSIS — M79671 Pain in right foot: Secondary | ICD-10-CM | POA: Diagnosis not present

## 2017-11-15 DIAGNOSIS — S40811A Abrasion of right upper arm, initial encounter: Secondary | ICD-10-CM | POA: Diagnosis not present

## 2017-11-16 DIAGNOSIS — M79671 Pain in right foot: Secondary | ICD-10-CM | POA: Diagnosis not present

## 2017-11-16 DIAGNOSIS — M79672 Pain in left foot: Secondary | ICD-10-CM | POA: Diagnosis not present

## 2017-11-17 DIAGNOSIS — M79671 Pain in right foot: Secondary | ICD-10-CM | POA: Diagnosis not present

## 2017-11-17 DIAGNOSIS — M79672 Pain in left foot: Secondary | ICD-10-CM | POA: Diagnosis not present

## 2017-11-24 DIAGNOSIS — M79672 Pain in left foot: Secondary | ICD-10-CM | POA: Diagnosis not present

## 2017-11-24 DIAGNOSIS — M79671 Pain in right foot: Secondary | ICD-10-CM | POA: Diagnosis not present

## 2017-11-30 DIAGNOSIS — M79672 Pain in left foot: Secondary | ICD-10-CM | POA: Diagnosis not present

## 2017-11-30 DIAGNOSIS — F411 Generalized anxiety disorder: Secondary | ICD-10-CM | POA: Diagnosis not present

## 2017-11-30 DIAGNOSIS — F3342 Major depressive disorder, recurrent, in full remission: Secondary | ICD-10-CM | POA: Diagnosis not present

## 2017-11-30 DIAGNOSIS — M79671 Pain in right foot: Secondary | ICD-10-CM | POA: Diagnosis not present

## 2017-12-05 DIAGNOSIS — M79671 Pain in right foot: Secondary | ICD-10-CM | POA: Diagnosis not present

## 2017-12-05 DIAGNOSIS — M79672 Pain in left foot: Secondary | ICD-10-CM | POA: Diagnosis not present

## 2017-12-06 DIAGNOSIS — D2261 Melanocytic nevi of right upper limb, including shoulder: Secondary | ICD-10-CM | POA: Diagnosis not present

## 2017-12-06 DIAGNOSIS — D2272 Melanocytic nevi of left lower limb, including hip: Secondary | ICD-10-CM | POA: Diagnosis not present

## 2017-12-06 DIAGNOSIS — D225 Melanocytic nevi of trunk: Secondary | ICD-10-CM | POA: Diagnosis not present

## 2017-12-06 DIAGNOSIS — L821 Other seborrheic keratosis: Secondary | ICD-10-CM | POA: Diagnosis not present

## 2017-12-06 DIAGNOSIS — D2271 Melanocytic nevi of right lower limb, including hip: Secondary | ICD-10-CM | POA: Diagnosis not present

## 2017-12-06 DIAGNOSIS — D2262 Melanocytic nevi of left upper limb, including shoulder: Secondary | ICD-10-CM | POA: Diagnosis not present

## 2017-12-19 ENCOUNTER — Other Ambulatory Visit: Payer: Self-pay | Admitting: Neurology

## 2017-12-20 ENCOUNTER — Other Ambulatory Visit: Payer: Self-pay

## 2017-12-20 MED ORDER — ATORVASTATIN CALCIUM 20 MG PO TABS: 20.0000 mg | ORAL_TABLET | Freq: Every day | ORAL | 1 refills | Status: DC

## 2018-01-03 DIAGNOSIS — E039 Hypothyroidism, unspecified: Secondary | ICD-10-CM | POA: Diagnosis not present

## 2018-01-03 DIAGNOSIS — R7303 Prediabetes: Secondary | ICD-10-CM | POA: Diagnosis not present

## 2018-01-03 DIAGNOSIS — H8113 Benign paroxysmal vertigo, bilateral: Secondary | ICD-10-CM | POA: Diagnosis not present

## 2018-01-03 DIAGNOSIS — J0100 Acute maxillary sinusitis, unspecified: Secondary | ICD-10-CM | POA: Diagnosis not present

## 2018-01-03 DIAGNOSIS — I10 Essential (primary) hypertension: Secondary | ICD-10-CM | POA: Diagnosis not present

## 2018-01-06 DIAGNOSIS — E119 Type 2 diabetes mellitus without complications: Secondary | ICD-10-CM | POA: Diagnosis not present

## 2018-02-17 ENCOUNTER — Other Ambulatory Visit: Payer: Self-pay | Admitting: Internal Medicine

## 2018-02-19 ENCOUNTER — Other Ambulatory Visit: Payer: Self-pay | Admitting: Internal Medicine

## 2018-03-04 DIAGNOSIS — J324 Chronic pansinusitis: Secondary | ICD-10-CM | POA: Diagnosis not present

## 2018-03-10 DIAGNOSIS — E1165 Type 2 diabetes mellitus with hyperglycemia: Secondary | ICD-10-CM | POA: Diagnosis not present

## 2018-03-10 DIAGNOSIS — R0789 Other chest pain: Secondary | ICD-10-CM | POA: Diagnosis not present

## 2018-03-10 DIAGNOSIS — Z79899 Other long term (current) drug therapy: Secondary | ICD-10-CM | POA: Diagnosis not present

## 2018-03-10 DIAGNOSIS — J80 Acute respiratory distress syndrome: Secondary | ICD-10-CM | POA: Diagnosis not present

## 2018-03-10 DIAGNOSIS — R0602 Shortness of breath: Secondary | ICD-10-CM | POA: Diagnosis not present

## 2018-03-10 DIAGNOSIS — J029 Acute pharyngitis, unspecified: Secondary | ICD-10-CM | POA: Diagnosis not present

## 2018-03-10 DIAGNOSIS — J45901 Unspecified asthma with (acute) exacerbation: Secondary | ICD-10-CM | POA: Diagnosis not present

## 2018-03-10 DIAGNOSIS — R079 Chest pain, unspecified: Secondary | ICD-10-CM | POA: Diagnosis not present

## 2018-03-10 DIAGNOSIS — R05 Cough: Secondary | ICD-10-CM | POA: Diagnosis not present

## 2018-03-12 ENCOUNTER — Other Ambulatory Visit: Payer: Self-pay | Admitting: Internal Medicine

## 2018-03-17 ENCOUNTER — Other Ambulatory Visit: Payer: Self-pay | Admitting: Internal Medicine

## 2018-04-11 ENCOUNTER — Other Ambulatory Visit: Payer: Self-pay | Admitting: Internal Medicine

## 2018-04-19 DIAGNOSIS — J411 Mucopurulent chronic bronchitis: Secondary | ICD-10-CM | POA: Diagnosis not present

## 2018-05-04 ENCOUNTER — Other Ambulatory Visit: Payer: Self-pay | Admitting: *Deleted

## 2018-05-04 MED ORDER — LOSARTAN POTASSIUM 100 MG PO TABS: 100.0000 mg | ORAL_TABLET | Freq: Every day | ORAL | 3 refills | Status: DC

## 2018-05-11 ENCOUNTER — Other Ambulatory Visit: Payer: Self-pay | Admitting: Internal Medicine

## 2018-06-15 ENCOUNTER — Other Ambulatory Visit: Payer: Self-pay | Admitting: Neurology

## 2018-06-19 ENCOUNTER — Ambulatory Visit: Payer: Medicare Other | Admitting: Internal Medicine

## 2018-06-22 ENCOUNTER — Ambulatory Visit (INDEPENDENT_AMBULATORY_CARE_PROVIDER_SITE_OTHER): Payer: Medicare Other | Admitting: Internal Medicine

## 2018-06-22 ENCOUNTER — Encounter: Payer: Self-pay | Admitting: Internal Medicine

## 2018-06-22 VITALS — BP 132/80 | HR 78 | Ht 65.0 in | Wt 227.0 lb

## 2018-06-22 DIAGNOSIS — Q211 Atrial septal defect: Secondary | ICD-10-CM | POA: Insufficient documentation

## 2018-06-22 DIAGNOSIS — Z01812 Encounter for preprocedural laboratory examination: Secondary | ICD-10-CM | POA: Diagnosis not present

## 2018-06-22 DIAGNOSIS — G459 Transient cerebral ischemic attack, unspecified: Secondary | ICD-10-CM | POA: Diagnosis not present

## 2018-06-22 DIAGNOSIS — Q2112 Patent foramen ovale: Secondary | ICD-10-CM | POA: Insufficient documentation

## 2018-06-22 DIAGNOSIS — Z79899 Other long term (current) drug therapy: Secondary | ICD-10-CM

## 2018-06-22 DIAGNOSIS — E782 Mixed hyperlipidemia: Secondary | ICD-10-CM | POA: Diagnosis not present

## 2018-06-22 NOTE — Progress Notes (Signed)
OFFICE NOTE  Chief Complaint:  Recent TIA/stroke  Primary Care Physician: Shirline Frees, MD  HPI:  Glenda Evans is a 63 y.o. female with a past medical history significant for some anxiety, fibromyalgia, hypertension, palpitations and hypothyroidism. She initially had evaluation of her palpitations in the early 2000's by Dr. Pernell Dupre. At the time she wore monitor, had a stress test and echocardiogram. The echocardiogram suggested mitral valve prolapse. Her monitor failed to show clear etiology of her symptoms. She does have a history of PVCs on her chart however those were not noted by EKG today. Recently she said she's had some increasing burden of palpitations. She denies any worsening stress. She also has sharp chest discomfort. She feels like this is different than her fibromyalgia, however it was reproduced during auscultation with my stethoscope today. She's had close to 20 pound weight gain over the past several months due to some orthopedic problems in her feet which she says is decreased her exercise. This certainly could explain some of her shortness of breath. She feels like the episodes of palpitations that occur very infrequently are paroxysmal. Oftentimes include heart racing and then can be followed by feelings of significant fatigue which is worse than her daily chronic fatigue.  09/17/2015  Glenda Evans returns today for follow-up. She wore a monitor which demonstrated PACs and has had some PVCs. Her echocardiogram showed normal systolic function with mild diastolic dysfunction. There was no evidence of mitral valve prolapse that was diagnostically significant. We talked about possible medication changes to try to help with her palpitations. She does note that she has significant side effects from verapamil including possible constipation.   12/26/2015  Glenda Evans returns today for follow-up. She reports marked improvement in her palpitations with adjustment in her  medications. Blood pressure is excellent today at 120/80. She denies any chest pain or worsening shortness of breath. She continues to be fatigued. Sleep is poor at night and generally she is quite awake. Occasionally she gas for breath but does not report heavy snoring. She naps a lot during the day.  05/14/2016  Glenda Evans reports improvement in her palpitations on diltiazem and she is pleased with good blood pressure control. She continues to struggle with daytime somnolence and insomnia at night. She manages only to get a couple hours of sleep at night but generally can sleep 6-8 hours during the day. He feels chronically fatigued. She underwent a sleep study which was negative for obstructive sleep apnea however might of been concern for either restless leg syndrome which she vehemently denies or perhaps narcolepsy disorder. Dr. Claiborne Billings ordered and an MSLT test. This is pending. She does use clonazepam to try to help with sleep and anxiety at night but she says is rarely effective. Her main issues not being able to turn her brain off at night.  12/16/2016  Glenda Evans was seen today in follow-up. Recently she's been concerned about leg pain and swelling. She did have a sleep study which was negative for sleep apnea. She reports her palpitations have improved on diltiazem. She does carry a diagnosis of fibromyalgia may have chronic fatigue syndrome. In addition she has some degree of neuropathy but has been intolerant to neuropathic pain medicines. She reports shooting pain down both legs. Seems to be worse in the right. There is some associated swelling. It's worse when she walks and gets better when she rests. She reports some cold feet. She's concerned as her husband recently had popliteal  aneurysms and ultimately had to have amputation of one of his legs. She's also concerned about same prominent veins as it may be related to varicose veins. She wonders if she may have had a blood  clot.  06/22/2018  Glenda Evans is seen today in routine follow-up.  Unfortunately recently she had a stroke when she was visiting a family member [facility].  She was under significant stress and ended up being hospitalized she has had for 2 weeks.  She underwent various imaging studies and was felt that her symptoms were due to stress.  She does have a history of elevated cholesterol and most recently her lipid profile in February 2019 did show a direct LDL 69 and triglycerides of 450.  She had previously been on WelChol but was not on statin therapy.  She did see Dr. Leonie Man with [facility] neurology who recommended her starting on atorvastatin 20 mg daily.  Currently however she is not on that medication has not been on it for several months because she was not sure why she was taking it.  Her last lipid profile was in March 2019 again showed a total cholesterol 184, HDL 41, LDL 91 and triglycerides of 260.  Blood pressure today was 132/80.  She has managed to lose about 8 pounds and is working on dietary modification.  Recently, she was also diagnosed with type 2 diabetes and her hemoglobin A1c was over 6.8.  She started on metformin which has helped her sugars and also surprisingly helped symptoms of leg pain which she had.  As part of her work-up she did have MRI/MRA.  This did demonstrate a bovine configuration of her aortic arch but no evidence of carotid artery disease, stenosis, aneurysm or vascular occlusion.  Surprisingly, I did locate an echocardiogram performed on 225/2019, which shows an LVEF of 50 to 55%.  If one looks really closely a bubble study was performed and under the right atrial tab it does indicate there was a right to left interatrial shunt after injection of agitated saline contrast.  This suggest that a PFO is noted.  I was able to locate the neurology consult note which indicated positive PFO but negative Dopplers for DVT.  Transcranial Doppler indicated normal intracranial  velocities.  There was no suggestion of pursuing the PFO as a cause of TIA, since LE venous dopplers were performed and were negative for DVT.  PMHx:  Past Medical History:  Diagnosis Date  . Allergy   . Anxiety   . Arthritis   . Asthma   . Cataract    right eye   . Depression   . Fatigue   . Fibromyalgia   . GERD (gastroesophageal reflux disease)   . Heart murmur   . HLD (hyperlipidemia)   . Hyperglycemia   . Hypertension   . Hypothyroidism   . IBS (irritable bowel syndrome)   . Insomnia   . Migraine   . Mitral valve prolapse   . Osteopenia   . Pre-diabetes   . PVC (premature ventricular contraction)   . Stein-Leventhal syndrome   . Wears glasses     Past Surgical History:  Procedure Laterality Date  . ABDOMINAL HYSTERECTOMY    . CESAREAN SECTION     x 2  . CHOLECYSTECTOMY    . COLONOSCOPY    . LIPOMA EXCISION  11/26/10   thigh x3 cyst  . OVARY SURGERY     to poke holes in ovaries for PCOD  . TONSILLECTOMY  FAMHx:  Family History  Problem Relation Age of Onset  . COPD Father   . Hypertension Father   . Colon polyps Father   . Diabetes Sister   . Colon polyps Mother   . Heart disease Mother        MVP  . Breast cancer Maternal Grandmother   . Colon polyps Maternal Grandmother   . Diabetes Maternal Grandmother   . Heart disease Maternal Grandmother   . COPD Maternal Grandmother   . Ovarian cancer Paternal Aunt   . Colon polyps Sister   . Colon polyps Maternal Aunt        x 2  . Diabetes Maternal Aunt   . Heart disease Sister        MVP  . COPD Maternal Aunt   . Colon cancer Neg Hx   . Esophageal cancer Neg Hx   . Rectal cancer Neg Hx   . Stomach cancer Neg Hx     SOCHx:   reports that she has never smoked. She has never used smokeless tobacco. She reports that she does not drink alcohol or use drugs.  ALLERGIES:  Allergies  Allergen Reactions  . Macrobid [Nitrofurantoin Macrocrystal] Swelling  . Metaxalone Shortness Of Breath  .  Sulfasalazine Shortness Of Breath  . Augmentin [Amoxicillin-Pot Clavulanate]   . Erythromycin   . Escitalopram Oxalate   . Lyrica [Pregabalin]   . Neurontin [Gabapentin]   . Ofloxacin   . Pamelor   . Relafen [Nabumetone]   . Septra [Bactrim]   . Skelaxin   . Sulfa Antibiotics     ROS: Pertinent items noted in HPI and remainder of comprehensive ROS otherwise negative.  HOME MEDS: Current Outpatient Medications  Medication Sig Dispense Refill  . albuterol (PROVENTIL HFA;VENTOLIN HFA) 108 (90 BASE) MCG/ACT inhaler Inhale 1 puff into the lungs every 6 (six) hours as needed for wheezing or shortness of breath.    . ALPRAZolam (XANAX) 0.5 MG tablet Take 1 tablet (0.5 mg total) by mouth 3 (three) times daily as needed for anxiety. 3 tablet 0  . aspirin 81 MG tablet Take 1 tablet (81 mg total) by mouth daily. 30 tablet 1  . atorvastatin (LIPITOR) 20 MG tablet Take 1 tablet (20 mg total) by mouth daily. 90 tablet 1  . Black Cohosh 40 MG CAPS Take 1 capsule by mouth daily.    . clonazePAM (KLONOPIN) 0.5 MG tablet Take 0.5 mg by mouth 2 (two) times daily.  0  . diltiazem (CARDIZEM CD) 240 MG 24 hr capsule Take 1 capsule (240 mg total) by mouth daily. Please keep upcoming appt for future refills. Thank you 30 capsule 3  . DULoxetine (CYMBALTA) 60 MG capsule Take 60 mg by mouth 2 (two) times daily.  3  . KLOR-CON M20 20 MEQ tablet Take 20 mEq by mouth daily. with food  5  . levothyroxine (SYNTHROID, LEVOTHROID) 100 MCG tablet Take 100 mcg by mouth every morning. On an empty stomach  0  . loratadine (CLARITIN) 10 MG tablet Take 10 mg by mouth.    . losartan (COZAAR) 100 MG tablet Take 1 tablet (100 mg total) by mouth daily. Please call and schedule follow up appointment 30 tablet 3  . metFORMIN (GLUCOPHAGE-XR) 500 MG 24 hr tablet Take 1 tablet by mouth daily.    . Multiple Vitamin (MULTIVITAMIN) tablet Take 1 tablet by mouth daily.    Marland Kitchen NEXIUM 40 MG capsule Take 40 mg by mouth daily.  0  .  Nutritional Supplements (ESTROVEN PO) Take by mouth daily.    . Nutritional Supplements (ESTROVEN PO) Take by mouth.    . OLANZapine (ZYPREXA) 5 MG tablet TAKE 1 TABLET BY MOUTH EVERYDAY AT BEDTIME  0  . Vitamin D, Ergocalciferol, (DRISDOL) 50000 UNITS CAPS capsule Take 50,000 Units by mouth every 7 (seven) days.     Current Facility-Administered Medications  Medication Dose Route Frequency Provider Last Rate Last Dose  . triamcinolone acetonide (KENALOG) 10 MG/ML injection 10 mg  10 mg Other Once Landis Martins, DPM        LABS/IMAGING: No results found for this or any previous visit (from the past 48 hour(s)). No results found.  WEIGHTS: Wt Readings from Last 3 Encounters:  06/22/18 227 lb (103 kg)  09/19/17 224 lb 6.4 oz (101.8 kg)  06/01/17 207 lb (93.9 kg)    VITALS: BP 132/80   Pulse 78   Ht 5\' 5"  (1.651 m)   Wt 227 lb (103 kg)   BMI 37.77 kg/m   EXAM: General appearance: alert and no distress Neck: no carotid bruit and no JVD Lungs: clear to auscultation bilaterally Heart: regular rate and rhythm, S1, S2 normal, no murmur, click, rub or gallop Abdomen: soft, non-tender; bowel sounds normal; no masses,  no organomegaly Extremities: extremities normal, atraumatic, no cyanosis or edema and varicose veins noted Pulses: 2+ and symmetric Skin: Skin color, texture, turgor normal. No rashes or lesions Neurologic: Grossly normal Psych: Moderately anxious  EKG: Normal sinus rhythm at 78-personally reviewed  ASSESSMENT: 1. Recurrent TIA without imaging evidence of stroke 2. PFO with right to left shunt 3. Low normal LVEF 50 to 55% (2019) 4. Bovine aortic arch 5. Bilateral leg pain and swelling 6. Palpitations - PAC's and PVC's (improved on diltiazem) 7. Dyspnea on exertion - normal systolic function and mild diastolic dysfunction 8. Essential hypertension 9. Atypical chest wall pain-likely related to fibromyalgia 10. Anxiety 11. Hypersomnolence  PLAN: 1.   Mrs.  Evans unfortunately had stroke in the setting of significant stress and was not found to have a clear etiology of this.  An echocardiogram showed LVEF of 50 to 55% with normal LV wall thickness and mild diastolic dysfunction, however was positive for PFO with right to left shunting.  Apparently lower extremity venous Dopplers were negative and no further work-up to consider closing the PFO was recommended.  According to Dr. Clydene Fake records as of June 2019, she has had other more mild episodes similar to her recent TIA.  I am not sure if Dr. Leonie Man was aware of the PFO when she saw him in June 2019, but I would recommend further imaging of her PFO to determine if closure may be beneficial.  We discussed imaging with a TEE.  I discussed the risk, benefits and alternatives of the procedure and she is willing to proceed.  Based on this findings, we may want to consider her for PFO closure.  I will reach out to Dr. Leonie Man for his opinion on this.  Finally, she has not started her statin medication as instructed.  I like to repeat a lipid profile and will likely have her start this or perhaps a higher dose based on her LDL to target goal less than 70.  Follow-up with me afterwards.  Pixie Casino, MD, [facility], [place] Director of the Advanced Lipid Disorders &  Cardiovascular Risk Reduction Clinic Diplomate of the American Board of Clinical Lipidology Attending Cardiologist  Direct  Dial: [phone]  Fax: [phone]  Website:  [email or web address] 06/22/2018, 2:40 PM

## 2018-06-22 NOTE — Patient Instructions (Addendum)
Medication Instructions:  Your Physician recommend you continue on your current medication as directed.    If you need a refill on your cardiac medications before your next appointment, please call your pharmacy.   Lab work: Your physician recommends that you return for lab work next week (fasting lipids, direct LDL) and 1 week prior to procedure (CBC, BMP).  If you have labs (blood work) drawn today and your tests are completely normal, you will receive your results only by: Marland Kitchen MyChart Message (if you have MyChart) OR . A paper copy in the mail If you have any lab test that is abnormal or we need to change your treatment, we will call you to review the results.  Testing/Procedures: Your physician has requested that you have a TEE. During a TEE, sound waves are used to create images of your heart. It provides your doctor with information about the size and shape of your heart and how well your heart's chambers and valves are working. In this test, a transducer is attached to the end of a flexible tube that's guided down your throat and into your esophagus (the tube leading from you mouth to your stomach) to get a more detailed image of your heart. You are not awake for the procedure. Please see the instruction sheet given to you today. For further information please visit [email or web address]   Follow-Up: At [facility], you and your health needs are our priority.  As part of our continuing mission to provide you with exceptional heart care, we have created designated Provider Care Teams.  These Care Teams include your primary Cardiologist (physician) and Advanced Practice Providers (APPs -  Physician Assistants and Nurse Practitioners) who all work together to provide you with the care you need, when you need it. You will need a follow up appointment in 1 months. You may see Dr. Debara Pickett or one of the following Advanced Practice Providers on your designated Care Team: Almyra Deforest, [place] . Fabian Sharp, PA-C   You are scheduled for a TEE  on Friday 07/14/18 with Dr. Debara Pickett.  Please arrive at the [facility] (Main Entrance A) at [facility]: [address] [place],  [address] at 8 am.  DIET: Nothing to eat or drink after midnight except a sip of water with medications (see medication instructions below)   Labs: 1 week prior to procedure (CBC, BMP)  You must have a responsible person to drive you home and stay in the waiting area during your procedure. Failure to do so could result in cancellation.  Bring your insurance cards.  *Special Note: Every effort is made to have your procedure done on time. Occasionally there are emergencies that occur at the hospital that may cause delays. Please be patient if a delay does occur.

## 2018-06-26 DIAGNOSIS — Z7251 High risk heterosexual behavior: Secondary | ICD-10-CM | POA: Diagnosis not present

## 2018-06-26 DIAGNOSIS — Z9189 Other specified personal risk factors, not elsewhere classified: Secondary | ICD-10-CM | POA: Diagnosis not present

## 2018-06-27 DIAGNOSIS — K219 Gastro-esophageal reflux disease without esophagitis: Secondary | ICD-10-CM | POA: Diagnosis not present

## 2018-06-27 DIAGNOSIS — Z7984 Long term (current) use of oral hypoglycemic drugs: Secondary | ICD-10-CM | POA: Diagnosis not present

## 2018-06-27 DIAGNOSIS — E119 Type 2 diabetes mellitus without complications: Secondary | ICD-10-CM | POA: Diagnosis not present

## 2018-06-27 DIAGNOSIS — F324 Major depressive disorder, single episode, in partial remission: Secondary | ICD-10-CM | POA: Diagnosis not present

## 2018-06-27 DIAGNOSIS — I10 Essential (primary) hypertension: Secondary | ICD-10-CM | POA: Diagnosis not present

## 2018-06-27 DIAGNOSIS — E039 Hypothyroidism, unspecified: Secondary | ICD-10-CM | POA: Diagnosis not present

## 2018-06-27 DIAGNOSIS — F419 Anxiety disorder, unspecified: Secondary | ICD-10-CM | POA: Diagnosis not present

## 2018-06-27 DIAGNOSIS — M797 Fibromyalgia: Secondary | ICD-10-CM | POA: Diagnosis not present

## 2018-06-27 DIAGNOSIS — E7800 Pure hypercholesterolemia, unspecified: Secondary | ICD-10-CM | POA: Diagnosis not present

## 2018-07-10 ENCOUNTER — Telehealth: Payer: Self-pay | Admitting: Internal Medicine

## 2018-07-10 DIAGNOSIS — G459 Transient cerebral ischemic attack, unspecified: Secondary | ICD-10-CM | POA: Diagnosis not present

## 2018-07-10 DIAGNOSIS — Z01812 Encounter for preprocedural laboratory examination: Secondary | ICD-10-CM | POA: Diagnosis not present

## 2018-07-10 LAB — LIPID PANEL
Chol/HDL Ratio: 4.9 ratio — ABNORMAL HIGH (ref 0.0–4.4)
Cholesterol, Total: 199 mg/dL (ref 100–199)
HDL: 41 mg/dL (ref >39)
LDL Calculated: 112 mg/dL — ABNORMAL HIGH (ref 0–99)
Triglycerides: 231 mg/dL — ABNORMAL HIGH (ref 0–149)
VLDL Cholesterol Cal: 46 mg/dL — ABNORMAL HIGH (ref 5–40)

## 2018-07-10 LAB — CBC
Hematocrit: 43.7 % (ref 34.0–46.6)
Hemoglobin: 14.7 g/dL (ref 11.1–15.9)
MCH: 31.7 pg (ref 26.6–33.0)
MCHC: 33.6 g/dL (ref 31.5–35.7)
MCV: 94 fL (ref 79–97)
PLATELETS: 349 10*3/uL (ref 150–450)
RBC: 4.63 x10E6/uL (ref 3.77–5.28)
RDW: 13 % (ref 11.7–15.4)
WBC: 7.9 10*3/uL (ref 3.4–10.8)

## 2018-07-10 LAB — BASIC METABOLIC PANEL
BUN/Creatinine Ratio: 13 (ref 12–28)
BUN: 10 mg/dL (ref 8–27)
CHLORIDE: 106 mmol/L (ref 96–106)
CO2: 22 mmol/L (ref 20–29)
Calcium: 9.7 mg/dL (ref 8.7–10.3)
Creatinine, Ser: 0.79 mg/dL (ref 0.57–1.00)
GFR calc Af Amer: 94 mL/min/{1.73_m2} (ref >59)
GFR calc non Af Amer: 82 mL/min/{1.73_m2} (ref >59)
Glucose: 108 mg/dL — ABNORMAL HIGH (ref 65–99)
Potassium: 4.1 mmol/L (ref 3.5–5.2)
Sodium: 145 mmol/L — ABNORMAL HIGH (ref 134–144)

## 2018-07-10 LAB — LDL CHOLESTEROL, DIRECT: LDL Direct: 128 mg/dL — ABNORMAL HIGH (ref 0–99)

## 2018-07-10 NOTE — Telephone Encounter (Signed)
   Primary Cardiologist:  No primary care provider on file.   Patient contacted.  History reviewed.  No symptoms to suggest any unstable cardiac conditions.  Based on discussion, with current pandemic situation, we will be postponing this appointment for @PATIENTNAME @.  If symptoms change, she has been instructed to contact our office.  CANCELLING ELECTIVE TEE PROCEDURE AT [place] ON 07/14/2018  Routing to C19 CANCEL pool for tracking (P CV DIV CV19 CANCEL) and assigning priority (1 = 4-6 wks, 2 = 6-12 wks, 3 = >12 wks).  Pixie Casino, MD  07/10/2018 11:38 AM         .

## 2018-07-11 ENCOUNTER — Other Ambulatory Visit: Payer: Self-pay

## 2018-07-11 DIAGNOSIS — Z79899 Other long term (current) drug therapy: Secondary | ICD-10-CM

## 2018-07-11 MED ORDER — ATORVASTATIN CALCIUM 40 MG PO TABS: 40.0000 mg | ORAL_TABLET | Freq: Every day | ORAL | 11 refills | Status: DC

## 2018-07-14 ENCOUNTER — Ambulatory Visit (HOSPITAL_COMMUNITY): Admission: RE | Admit: 2018-07-14 | Payer: Medicare Other | Source: Home / Self Care | Admitting: Internal Medicine

## 2018-07-14 ENCOUNTER — Encounter (HOSPITAL_COMMUNITY): Admission: RE | Payer: Self-pay | Source: Home / Self Care

## 2018-07-14 SURGERY — ECHOCARDIOGRAM, TRANSESOPHAGEAL
Anesthesia: Monitor Anesthesia Care

## 2018-08-01 ENCOUNTER — Other Ambulatory Visit: Payer: Self-pay | Admitting: Internal Medicine

## 2018-08-01 MED ORDER — IRBESARTAN 150 MG PO TABS: 150.0000 mg | ORAL_TABLET | Freq: Every day | ORAL | 3 refills | Status: DC

## 2018-08-01 NOTE — Telephone Encounter (Signed)
Recommend irbesartan 150 mg daily.  Dr. Lemmie Evens

## 2018-08-01 NOTE — Telephone Encounter (Signed)
Valsartan changed to irbesartan due to valsartan on back order per vo from Dr Debara Pickett.

## 2018-08-06 ENCOUNTER — Other Ambulatory Visit: Payer: Self-pay | Admitting: Internal Medicine

## 2018-08-07 ENCOUNTER — Telehealth: Payer: Self-pay | Admitting: Internal Medicine

## 2018-08-07 NOTE — Telephone Encounter (Signed)
Losartan 100 mg refilled. 

## 2018-08-07 NOTE — Telephone Encounter (Signed)
Spoke with Tammy and Losartan currently on backorder but [facility] has in stock. Spoke with patient and she has about 3 weeks worth of and will see if [facility] any at that time. Will cancel Rx for Valsartan

## 2018-08-07 NOTE — Telephone Encounter (Signed)
New Message;   They need direction for pt's Valsartan 160 mg please.

## 2018-08-21 ENCOUNTER — Other Ambulatory Visit: Payer: Self-pay | Admitting: Obstetrics and Gynecology

## 2018-08-21 DIAGNOSIS — Z1231 Encounter for screening mammogram for malignant neoplasm of breast: Secondary | ICD-10-CM

## 2018-09-07 ENCOUNTER — Other Ambulatory Visit: Payer: Self-pay | Admitting: Internal Medicine

## 2018-11-06 ENCOUNTER — Other Ambulatory Visit: Payer: Self-pay | Admitting: Internal Medicine

## 2018-11-24 ENCOUNTER — Other Ambulatory Visit: Payer: Self-pay | Admitting: Internal Medicine

## 2018-12-06 ENCOUNTER — Other Ambulatory Visit: Payer: Self-pay

## 2018-12-06 ENCOUNTER — Encounter: Payer: Self-pay | Admitting: Internal Medicine

## 2018-12-06 ENCOUNTER — Ambulatory Visit (INDEPENDENT_AMBULATORY_CARE_PROVIDER_SITE_OTHER): Payer: Medicare Other | Admitting: Internal Medicine

## 2018-12-06 VITALS — BP 124/82 | HR 87 | Temp 97.5°F | Ht 65.0 in | Wt 213.4 lb

## 2018-12-06 DIAGNOSIS — Q2112 Patent foramen ovale: Secondary | ICD-10-CM

## 2018-12-06 DIAGNOSIS — Z01812 Encounter for preprocedural laboratory examination: Secondary | ICD-10-CM

## 2018-12-06 DIAGNOSIS — G459 Transient cerebral ischemic attack, unspecified: Secondary | ICD-10-CM

## 2018-12-06 DIAGNOSIS — Q211 Atrial septal defect: Secondary | ICD-10-CM

## 2018-12-06 NOTE — Patient Instructions (Signed)
Medication Instructions:  Dr. Debara Pickett recommends that you take ASPIRIN 81mg  daily If you need a refill on your cardiac medications before your next appointment, please call your pharmacy.   Lab work: BMET & CBC prior to TEE If you have labs (blood work) drawn today and your tests are completely normal, you will receive your results only by: Marland Kitchen MyChart Message (if you have MyChart) OR . A paper copy in the mail If you have any lab test that is abnormal or we need to change your treatment, we will call you to review the results.  Testing/Procedures: Transesophageal Echocardiogram (TEE) done at [facility] with Dr. Debara Pickett on January 04, 2019  Follow-Up: At [facility], you and your health needs are our priority.  As part of our continuing mission to provide you with exceptional heart care, we have created designated Provider Care Teams.  These Care Teams include your primary Cardiologist (physician) and Advanced Practice Providers (APPs -  Physician Assistants and Nurse Practitioners) who all work together to provide you with the care you need, when you need it. You will need a follow up appointment in 6 months.  Please call our office 2 months in advance to schedule this appointment.  You may see Pixie Casino, MD or one of the following Advanced Practice Providers on your designated Care Team: [place], [place] . Fabian Sharp, PA-C  Any Other Special Instructions Will Be Listed Below (If Applicable).   You are scheduled for a TEE on January 04, 2019 with Dr. Debara Pickett.  Please arrive at the [facility] (Main Entrance A) at [facility]: [address] [place], [place] [address] at 7:30 AM.   Dennis Bast will need a COVID19 screening test on Monday September 14. You will be required to quarantine from the time of your test until your procedure on Thursday 9/17. The COVID19 test is done at [facility] ([place] [facility]). This is a drive thru test  site.   DIET: Nothing to eat or drink after midnight except a sip of water with medications (see medication instructions below)  Medication Instructions: Hold METFORMIN the morning of procedure  Labs: CBC & BMET to be completed before procedure. OK to do on 9/14 at Dr. Lysbeth Penner office  You must have a responsible person to drive you home and stay in the waiting area during your procedure. Failure to do so could result in cancellation.  Bring your insurance cards.  *Special Note: Every effort is made to have your procedure done on time. Occasionally there are emergencies that occur at the hospital that may cause delays. Please be patient if a delay does occur.

## 2018-12-06 NOTE — Progress Notes (Signed)
OFFICE NOTE  Chief Complaint:  Recent TIA/stroke  Primary Care Physician: Shirline Frees, MD  HPI:  Glenda Evans is a 63 y.o. female with a past medical history significant for some anxiety, fibromyalgia, hypertension, palpitations and hypothyroidism. She initially had evaluation of her palpitations in the early 2000's by Dr. Pernell Dupre. At the time she wore monitor, had a stress test and echocardiogram. The echocardiogram suggested mitral valve prolapse. Her monitor failed to show clear etiology of her symptoms. She does have a history of PVCs on her chart however those were not noted by EKG today. Recently she said she's had some increasing burden of palpitations. She denies any worsening stress. She also has sharp chest discomfort. She feels like this is different than her fibromyalgia, however it was reproduced during auscultation with my stethoscope today. She's had close to 20 pound weight gain over the past several months due to some orthopedic problems in her feet which she says is decreased her exercise. This certainly could explain some of her shortness of breath. She feels like the episodes of palpitations that occur very infrequently are paroxysmal. Oftentimes include heart racing and then can be followed by feelings of significant fatigue which is worse than her daily chronic fatigue.  09/17/2015  Glenda Evans returns today for follow-up. She wore a monitor which demonstrated PACs and has had some PVCs. Her echocardiogram showed normal systolic function with mild diastolic dysfunction. There was no evidence of mitral valve prolapse that was diagnostically significant. We talked about possible medication changes to try to help with her palpitations. She does note that she has significant side effects from verapamil including possible constipation.   12/26/2015  Glenda Evans returns today for follow-up. She reports marked improvement in her palpitations with adjustment in her  medications. Blood pressure is excellent today at 120/80. She denies any chest pain or worsening shortness of breath. She continues to be fatigued. Sleep is poor at night and generally she is quite awake. Occasionally she gas for breath but does not report heavy snoring. She naps a lot during the day.  05/14/2016  Glenda Evans reports improvement in her palpitations on diltiazem and she is pleased with good blood pressure control. She continues to struggle with daytime somnolence and insomnia at night. She manages only to get a couple hours of sleep at night but generally can sleep 6-8 hours during the day. He feels chronically fatigued. She underwent a sleep study which was negative for obstructive sleep apnea however might of been concern for either restless leg syndrome which she vehemently denies or perhaps narcolepsy disorder. Dr. Claiborne Billings ordered and an MSLT test. This is pending. She does use clonazepam to try to help with sleep and anxiety at night but she says is rarely effective. Her main issues not being able to turn her brain off at night.  12/16/2016  Glenda Evans was seen today in follow-up. Recently she's been concerned about leg pain and swelling. She did have a sleep study which was negative for sleep apnea. She reports her palpitations have improved on diltiazem. She does carry a diagnosis of fibromyalgia may have chronic fatigue syndrome. In addition she has some degree of neuropathy but has been intolerant to neuropathic pain medicines. She reports shooting pain down both legs. Seems to be worse in the right. There is some associated swelling. It's worse when she walks and gets better when she rests. She reports some cold feet. She's concerned as her husband recently had popliteal  aneurysms and ultimately had to have amputation of one of his legs. She's also concerned about same prominent veins as it may be related to varicose veins. She wonders if she may have had a blood  clot.  06/22/2018  Glenda Evans is seen today in routine follow-up.  Unfortunately recently she had a stroke when she was visiting a family member [facility].  She was under significant stress and ended up being hospitalized she has had for 2 weeks.  She underwent various imaging studies and was felt that her symptoms were due to stress.  She does have a history of elevated cholesterol and most recently her lipid profile in February 2019 did show a direct LDL 69 and triglycerides of 450.  She had previously been on WelChol but was not on statin therapy.  She did see Dr. Leonie Man with [facility] who recommended her starting on atorvastatin 20 mg daily.  Currently however she is not on that medication has not been on it for several months because she was not sure why she was taking it.  Her last lipid profile was in March 2019 again showed a total cholesterol 184, HDL 41, LDL 91 and triglycerides of 260.  Blood pressure today was 132/80.  She has managed to lose about 8 pounds and is working on dietary modification.  Recently, she was also diagnosed with type 2 diabetes and her hemoglobin A1c was over 6.8.  She started on metformin which has helped her sugars and also surprisingly helped symptoms of leg pain which she had.  As part of her work-up she did have MRI/MRA.  This did demonstrate a bovine configuration of her aortic arch but no evidence of carotid artery disease, stenosis, aneurysm or vascular occlusion.  Surprisingly, I did locate an echocardiogram performed on 225/2019, which shows an LVEF of 50 to 55%.  If one looks really closely a bubble study was performed and under the right atrial tab it does indicate there was a right to left interatrial shunt after injection of agitated saline contrast.  This suggest that a PFO is noted.  I was able to locate the neurology consult note which indicated positive PFO but negative Dopplers for DVT.  Transcranial Doppler indicated normal intracranial  velocities.  There was no suggestion of pursuing the PFO as a cause of TIA, since LE venous dopplers were performed and were negative for DVT.  12/06/2018  Glenda Evans is seen today in follow-up.  She was supposed to undergo TEE to evaluate for PFO however with COVID this was delayed.  She indicates that she is interested in pursuing this.  Unfortunately she has not been compliant with aspirin.  I asked her to restart that today.  Otherwise she is asymptomatic.  She denies any chest pain or worsening shortness of breath.  She has had no further TIAs but does get some occasional dizzy episodes which she thinks could be related.  EKG is normal today.  Blood pressure is normal as well.  PMHx:  Past Medical History:  Diagnosis Date   Allergy    Anxiety    Arthritis    Asthma    Cataract    right eye    Depression    Fatigue    Fibromyalgia    GERD (gastroesophageal reflux disease)    Heart murmur    HLD (hyperlipidemia)    Hyperglycemia    Hypertension    Hypothyroidism    IBS (irritable bowel syndrome)    Insomnia  Migraine    Mitral valve prolapse    Osteopenia    Pre-diabetes    PVC (premature ventricular contraction)    Stein-Leventhal syndrome    Wears glasses     Past Surgical History:  Procedure Laterality Date   ABDOMINAL HYSTERECTOMY     CESAREAN SECTION     x 2   CHOLECYSTECTOMY     COLONOSCOPY     LIPOMA EXCISION  11/26/10   thigh x3 cyst   OVARY SURGERY     to poke holes in ovaries for PCOD   TONSILLECTOMY      FAMHx:  Family History  Problem Relation Age of Onset   COPD Father    Hypertension Father    Colon polyps Father    Diabetes Sister    Colon polyps Mother    Heart disease Mother        MVP   Breast cancer Maternal Grandmother    Colon polyps Maternal Grandmother    Diabetes Maternal Grandmother    Heart disease Maternal Grandmother    COPD Maternal Grandmother    Ovarian cancer Paternal Aunt     Colon polyps Sister    Colon polyps Maternal Aunt        x 2   Diabetes Maternal Aunt    Heart disease Sister        MVP   COPD Maternal Aunt    Colon cancer Neg Hx    Esophageal cancer Neg Hx    Rectal cancer Neg Hx    Stomach cancer Neg Hx     SOCHx:   reports that she has never smoked. She has never used smokeless tobacco. She reports that she does not drink alcohol or use drugs.  ALLERGIES:  Allergies  Allergen Reactions   Augmentin [Amoxicillin-Pot Clavulanate] Shortness Of Breath and Swelling    Did it involve swelling of the face/tongue/throat, SOB, or low BP? Yes Did it involve sudden or severe rash/hives, skin peeling, or any reaction on the inside of your mouth or nose? No Did you need to seek medical attention at a hospital or doctor's office? No When did it last happen?10 years ago If all above answers are NO, may proceed with cephalosporin use.    Erythromycin Shortness Of Breath and Swelling   Lyrica [Pregabalin] Shortness Of Breath and Swelling   Macrobid [Nitrofurantoin Macrocrystal] Swelling   Metaxalone Shortness Of Breath   Neurontin [Gabapentin] Shortness Of Breath and Swelling   Ofloxacin Shortness Of Breath and Swelling   Pamelor Shortness Of Breath and Swelling   Relafen [Nabumetone] Shortness Of Breath and Swelling   Septra [Bactrim] Shortness Of Breath and Swelling   Sulfasalazine Shortness Of Breath   Escitalopram Oxalate Other (See Comments)    Unsure of exact reaction type    ROS: Pertinent items noted in HPI and remainder of comprehensive ROS otherwise negative.  HOME MEDS: Current Outpatient Medications  Medication Sig Dispense Refill   albuterol (PROVENTIL HFA;VENTOLIN HFA) 108 (90 BASE) MCG/ACT inhaler Inhale 1 puff into the lungs every 6 (six) hours as needed for wheezing or shortness of breath.     atorvastatin (LIPITOR) 40 MG tablet Take 1 tablet (40 mg total) by mouth daily at 6 PM. 30 tablet 11   BLACK  COHOSH PO Take 1 capsule by mouth every evening.     clonazePAM (KLONOPIN) 0.5 MG tablet Take 0.5 mg by mouth at bedtime.   0   diltiazem (CARDIZEM CD) 240 MG 24 hr capsule TAKE 1  CAPSULE BY MOUTH EVERY DAY **PLEASE KEEP APPT FOR MORE REFILLS** 90 capsule 1   DULoxetine (CYMBALTA) 60 MG capsule Take 60 mg by mouth 2 (two) times daily.   3   KLOR-CON M20 20 MEQ tablet Take 20 mEq by mouth daily after lunch.   5   levothyroxine (SYNTHROID, LEVOTHROID) 125 MCG tablet Take 125 mcg by mouth daily before breakfast.     loratadine (CLARITIN) 10 MG tablet Take 10 mg by mouth daily as needed for allergies.      losartan (COZAAR) 100 MG tablet TAKE 1 TABLET BY MOUTH DAILY. PLEASE CALL AND SCHEDULE FOLLOW UP APPOINTMENT 90 tablet 1   metFORMIN (GLUCOPHAGE-XR) 500 MG 24 hr tablet Take 500 mg by mouth every evening.      Multiple Vitamin (MULTIVITAMIN WITH MINERALS) TABS tablet Take 1 tablet by mouth every evening.     NEXIUM 40 MG capsule Take 40 mg by mouth every evening.   0   Nutritional Supplements (ESTROVEN PO) Take 1 tablet by mouth every evening.      Vitamin D, Ergocalciferol, (DRISDOL) 50000 UNITS CAPS capsule Take 50,000 Units by mouth every Sunday.      Current Facility-Administered Medications  Medication Dose Route Frequency Provider Last Rate Last Dose   triamcinolone acetonide (KENALOG) 10 MG/ML injection 10 mg  10 mg Other Once Landis Martins, DPM        LABS/IMAGING: No results found for this or any previous visit (from the past 48 hour(s)). No results found.  WEIGHTS: Wt Readings from Last 3 Encounters:  12/06/18 213 lb 6.4 oz (96.8 kg)  06/22/18 227 lb (103 kg)  09/19/17 224 lb 6.4 oz (101.8 kg)    VITALS: BP 124/82    Pulse 87    Temp (!) 97.5 F (36.4 C) (Temporal)    Ht 5\' 5"  (1.651 m)    Wt 213 lb 6.4 oz (96.8 kg)    SpO2 96%    BMI 35.51 kg/m   EXAM: General appearance: alert and no distress Neck: no carotid bruit and no JVD Lungs: clear to auscultation  bilaterally Heart: regular rate and rhythm, S1, S2 normal, no murmur, click, rub or gallop Abdomen: soft, non-tender; bowel sounds normal; no masses,  no organomegaly Extremities: extremities normal, atraumatic, no cyanosis or edema and varicose veins noted Pulses: 2+ and symmetric Skin: Skin color, texture, turgor normal. No rashes or lesions Neurologic: Grossly normal Psych: Moderately anxious  EKG: Normal sinus rhythm 87, left axis deviation-personally reviewed  ASSESSMENT: 1. Recurrent TIA without imaging evidence of stroke 2. PFO with right to left shunt 3. Low normal LVEF 50 to 55% (2019) 4. Bovine aortic arch 5. Bilateral leg pain and swelling 6. Palpitations - PAC's and PVC's (improved on diltiazem) 7. Dyspnea on exertion - normal systolic function and mild diastolic dysfunction 8. Essential hypertension 9. Atypical chest wall pain-likely related to fibromyalgia 10. Anxiety 11. Hypersomnolence  PLAN: 1.   Glenda Evans was scheduled for a TEE to look for PFO however this was postponed due to [place].  She is now interested in doing it again.  Unfortunately she had stopped her aspirin and I encouraged her to restart it.  We will plan to do TEE in the near future and if she has significant atrial shunting then I would likely refer her for PFO closure.  Follow-up with me afterwards.  Pixie Casino, MD, [facility], [place] Director of the Advanced Lipid Disorders &  Cardiovascular Risk Reduction Clinic Diplomate of the American Board of Clinical Lipidology Attending Cardiologist  Direct Dial: [phone]   Fax: [phone]  Website:  [email or web address] 12/06/2018, 2:16 PM

## 2018-12-07 ENCOUNTER — Ambulatory Visit: Payer: Medicare Other

## 2018-12-07 ENCOUNTER — Telehealth: Payer: Self-pay | Admitting: Internal Medicine

## 2018-12-07 NOTE — Telephone Encounter (Signed)
Spoke with Legrand Como (spouse - DPR) and notified him that patient's COVID19 screening is scheduled on 01/01/2019 @ 10:30am

## 2018-12-19 ENCOUNTER — Other Ambulatory Visit: Payer: Self-pay | Admitting: Internal Medicine

## 2018-12-20 ENCOUNTER — Other Ambulatory Visit: Payer: Self-pay | Admitting: Internal Medicine

## 2018-12-20 NOTE — Telephone Encounter (Signed)
Please advise. Losartan 50mg  and 100mg  tablets are on backorder per pharmacy however they do have 25 mg tablets in stock.

## 2018-12-22 NOTE — Telephone Encounter (Signed)
Rx(s) sent to pharmacy electronically.  

## 2018-12-27 ENCOUNTER — Other Ambulatory Visit: Payer: Self-pay | Admitting: Internal Medicine

## 2018-12-27 DIAGNOSIS — Q211 Atrial septal defect: Secondary | ICD-10-CM

## 2018-12-27 DIAGNOSIS — G459 Transient cerebral ischemic attack, unspecified: Secondary | ICD-10-CM

## 2018-12-27 DIAGNOSIS — Q2112 Patent foramen ovale: Secondary | ICD-10-CM

## 2018-12-28 DIAGNOSIS — E119 Type 2 diabetes mellitus without complications: Secondary | ICD-10-CM | POA: Diagnosis not present

## 2018-12-28 DIAGNOSIS — I10 Essential (primary) hypertension: Secondary | ICD-10-CM | POA: Diagnosis not present

## 2018-12-28 DIAGNOSIS — Z7984 Long term (current) use of oral hypoglycemic drugs: Secondary | ICD-10-CM | POA: Diagnosis not present

## 2018-12-28 DIAGNOSIS — J4520 Mild intermittent asthma, uncomplicated: Secondary | ICD-10-CM | POA: Diagnosis not present

## 2018-12-28 DIAGNOSIS — M797 Fibromyalgia: Secondary | ICD-10-CM | POA: Diagnosis not present

## 2018-12-28 DIAGNOSIS — E039 Hypothyroidism, unspecified: Secondary | ICD-10-CM | POA: Diagnosis not present

## 2018-12-28 DIAGNOSIS — E7800 Pure hypercholesterolemia, unspecified: Secondary | ICD-10-CM | POA: Diagnosis not present

## 2018-12-28 DIAGNOSIS — K219 Gastro-esophageal reflux disease without esophagitis: Secondary | ICD-10-CM | POA: Diagnosis not present

## 2018-12-28 DIAGNOSIS — F324 Major depressive disorder, single episode, in partial remission: Secondary | ICD-10-CM | POA: Diagnosis not present

## 2018-12-29 DIAGNOSIS — I10 Essential (primary) hypertension: Secondary | ICD-10-CM | POA: Diagnosis not present

## 2018-12-29 DIAGNOSIS — Z7984 Long term (current) use of oral hypoglycemic drugs: Secondary | ICD-10-CM | POA: Diagnosis not present

## 2018-12-29 DIAGNOSIS — E119 Type 2 diabetes mellitus without complications: Secondary | ICD-10-CM | POA: Diagnosis not present

## 2019-01-01 ENCOUNTER — Other Ambulatory Visit (HOSPITAL_COMMUNITY)
Admission: RE | Admit: 2019-01-01 | Discharge: 2019-01-01 | Disposition: A | Discharge status: Home / Self Care | Payer: Medicare Other | Source: Ambulatory Visit | Attending: Internal Medicine | Admitting: Internal Medicine

## 2019-01-01 DIAGNOSIS — Q211 Atrial septal defect: Secondary | ICD-10-CM | POA: Diagnosis not present

## 2019-01-01 DIAGNOSIS — R9439 Abnormal result of other cardiovascular function study: Secondary | ICD-10-CM | POA: Insufficient documentation

## 2019-01-01 DIAGNOSIS — Z8673 Personal history of transient ischemic attack (TIA), and cerebral infarction without residual deficits: Secondary | ICD-10-CM | POA: Insufficient documentation

## 2019-01-01 DIAGNOSIS — Z20828 Contact with and (suspected) exposure to other viral communicable diseases: Secondary | ICD-10-CM | POA: Insufficient documentation

## 2019-01-01 DIAGNOSIS — Z01812 Encounter for preprocedural laboratory examination: Secondary | ICD-10-CM | POA: Insufficient documentation

## 2019-01-01 DIAGNOSIS — R0600 Dyspnea, unspecified: Secondary | ICD-10-CM | POA: Diagnosis not present

## 2019-01-01 DIAGNOSIS — I493 Ventricular premature depolarization: Secondary | ICD-10-CM | POA: Diagnosis not present

## 2019-01-01 DIAGNOSIS — R002 Palpitations: Secondary | ICD-10-CM | POA: Diagnosis not present

## 2019-01-01 LAB — BASIC METABOLIC PANEL
BUN/Creatinine Ratio: 17 (ref 12–28)
BUN: 16 mg/dL (ref 8–27)
CO2: 22 mmol/L (ref 20–29)
Calcium: 9.4 mg/dL (ref 8.7–10.3)
Chloride: 102 mmol/L (ref 96–106)
Creatinine, Ser: 0.94 mg/dL (ref 0.57–1.00)
GFR calc Af Amer: 76 mL/min/{1.73_m2} (ref >59)
GFR calc non Af Amer: 66 mL/min/{1.73_m2} (ref >59)
Glucose: 120 mg/dL — ABNORMAL HIGH (ref 65–99)
Potassium: 4 mmol/L (ref 3.5–5.2)
Sodium: 145 mmol/L — ABNORMAL HIGH (ref 134–144)

## 2019-01-01 LAB — CBC
Hematocrit: 42.5 % (ref 34.0–46.6)
Hemoglobin: 14.5 g/dL (ref 11.1–15.9)
MCH: 31.6 pg (ref 26.6–33.0)
MCHC: 34.1 g/dL (ref 31.5–35.7)
MCV: 93 fL (ref 79–97)
Platelets: 353 10*3/uL (ref 150–450)
RBC: 4.59 x10E6/uL (ref 3.77–5.28)
RDW: 12.3 % (ref 11.7–15.4)
WBC: 9.4 10*3/uL (ref 3.4–10.8)

## 2019-01-02 LAB — NOVEL CORONAVIRUS, NAA (HOSP ORDER, SEND-OUT TO REF LAB; TAT 18-24 HRS): SARS-CoV-2, NAA: NOT DETECTED

## 2019-01-04 ENCOUNTER — Telehealth: Payer: Self-pay | Admitting: *Deleted

## 2019-01-04 ENCOUNTER — Encounter (HOSPITAL_COMMUNITY)
Admission: RE | Disposition: A | Discharge status: Home / Self Care | Payer: Self-pay | Source: Home / Self Care | Attending: Internal Medicine

## 2019-01-04 ENCOUNTER — Ambulatory Visit (HOSPITAL_COMMUNITY)
Admission: RE | Admit: 2019-01-04 | Discharge: 2019-01-04 | Disposition: A | Discharge status: Home / Self Care | Payer: Medicare Other | Attending: Internal Medicine | Admitting: Internal Medicine

## 2019-01-04 ENCOUNTER — Encounter (HOSPITAL_COMMUNITY): Payer: Self-pay | Admitting: *Deleted

## 2019-01-04 ENCOUNTER — Ambulatory Visit (HOSPITAL_BASED_OUTPATIENT_CLINIC_OR_DEPARTMENT_OTHER)
Admission: RE | Admit: 2019-01-04 | Discharge: 2019-01-04 | Disposition: A | Discharge status: Home / Self Care | Payer: Medicare Other | Source: Ambulatory Visit | Attending: Internal Medicine | Admitting: Internal Medicine

## 2019-01-04 DIAGNOSIS — Q2112 Patent foramen ovale: Secondary | ICD-10-CM

## 2019-01-04 DIAGNOSIS — Z79890 Hormone replacement therapy (postmenopausal): Secondary | ICD-10-CM | POA: Insufficient documentation

## 2019-01-04 DIAGNOSIS — Z888 Allergy status to other drugs, medicaments and biological substances status: Secondary | ICD-10-CM | POA: Diagnosis not present

## 2019-01-04 DIAGNOSIS — Z880 Allergy status to penicillin: Secondary | ICD-10-CM | POA: Diagnosis not present

## 2019-01-04 DIAGNOSIS — Z881 Allergy status to other antibiotic agents status: Secondary | ICD-10-CM | POA: Insufficient documentation

## 2019-01-04 DIAGNOSIS — I639 Cerebral infarction, unspecified: Secondary | ICD-10-CM | POA: Insufficient documentation

## 2019-01-04 DIAGNOSIS — I10 Essential (primary) hypertension: Secondary | ICD-10-CM | POA: Diagnosis not present

## 2019-01-04 DIAGNOSIS — J45909 Unspecified asthma, uncomplicated: Secondary | ICD-10-CM | POA: Insufficient documentation

## 2019-01-04 DIAGNOSIS — Q211 Atrial septal defect: Secondary | ICD-10-CM | POA: Diagnosis not present

## 2019-01-04 DIAGNOSIS — F329 Major depressive disorder, single episode, unspecified: Secondary | ICD-10-CM | POA: Diagnosis not present

## 2019-01-04 DIAGNOSIS — Z7984 Long term (current) use of oral hypoglycemic drugs: Secondary | ICD-10-CM | POA: Insufficient documentation

## 2019-01-04 DIAGNOSIS — E039 Hypothyroidism, unspecified: Secondary | ICD-10-CM | POA: Insufficient documentation

## 2019-01-04 DIAGNOSIS — F419 Anxiety disorder, unspecified: Secondary | ICD-10-CM | POA: Insufficient documentation

## 2019-01-04 DIAGNOSIS — K219 Gastro-esophageal reflux disease without esophagitis: Secondary | ICD-10-CM | POA: Diagnosis not present

## 2019-01-04 DIAGNOSIS — M797 Fibromyalgia: Secondary | ICD-10-CM | POA: Insufficient documentation

## 2019-01-04 DIAGNOSIS — G4710 Hypersomnia, unspecified: Secondary | ICD-10-CM | POA: Insufficient documentation

## 2019-01-04 DIAGNOSIS — G459 Transient cerebral ischemic attack, unspecified: Secondary | ICD-10-CM

## 2019-01-04 DIAGNOSIS — R002 Palpitations: Secondary | ICD-10-CM | POA: Diagnosis not present

## 2019-01-04 DIAGNOSIS — Z79899 Other long term (current) drug therapy: Secondary | ICD-10-CM | POA: Insufficient documentation

## 2019-01-04 DIAGNOSIS — R7303 Prediabetes: Secondary | ICD-10-CM | POA: Insufficient documentation

## 2019-01-04 DIAGNOSIS — E785 Hyperlipidemia, unspecified: Secondary | ICD-10-CM | POA: Insufficient documentation

## 2019-01-04 HISTORY — PX: BUBBLE STUDY: SHX6837

## 2019-01-04 HISTORY — PX: TEE WITHOUT CARDIOVERSION: SHX5443

## 2019-01-04 LAB — GLUCOSE, CAPILLARY: Glucose-Capillary: 113 mg/dL — ABNORMAL HIGH (ref 70–99)

## 2019-01-04 SURGERY — ECHOCARDIOGRAM, TRANSESOPHAGEAL
Anesthesia: Moderate Sedation

## 2019-01-04 MED ORDER — DIPHENHYDRAMINE HCL 50 MG/ML IJ SOLN
INTRAMUSCULAR | Status: DC | PRN
  Administered 2019-01-04: 25 mg via INTRAVENOUS

## 2019-01-04 MED ORDER — BUTAMBEN-TETRACAINE-BENZOCAINE 2-2-14 % EX AERO
INHALATION_SPRAY | CUTANEOUS | Status: DC | PRN
  Administered 2019-01-04: 09:00:00 2 via TOPICAL

## 2019-01-04 MED ORDER — FENTANYL CITRATE (PF) 100 MCG/2ML IJ SOLN
INTRAMUSCULAR | Status: AC
  Filled 2019-01-04: qty 2

## 2019-01-04 MED ORDER — FENTANYL CITRATE (PF) 100 MCG/2ML IJ SOLN
INTRAMUSCULAR | Status: DC | PRN
  Administered 2019-01-04 (×4): 25 ug via INTRAVENOUS

## 2019-01-04 MED ORDER — SODIUM CHLORIDE 0.9 % IV SOLN
INTRAVENOUS | Status: DC
  Administered 2019-01-04: 08:00:00 via INTRAVENOUS

## 2019-01-04 MED ORDER — DIPHENHYDRAMINE HCL 50 MG/ML IJ SOLN
INTRAMUSCULAR | Status: AC
  Filled 2019-01-04: qty 1

## 2019-01-04 MED ORDER — MIDAZOLAM HCL (PF) 10 MG/2ML IJ SOLN
INTRAMUSCULAR | Status: DC | PRN
  Administered 2019-01-04: 2 mg via INTRAVENOUS
  Administered 2019-01-04 (×2): 1 mg via INTRAVENOUS
  Administered 2019-01-04: 2 mg via INTRAVENOUS

## 2019-01-04 MED ORDER — MIDAZOLAM HCL (PF) 5 MG/ML IJ SOLN
INTRAMUSCULAR | Status: AC
  Filled 2019-01-04: qty 2

## 2019-01-04 NOTE — CV Procedure (Signed)
TRANSESOPHAGEAL ECHOCARDIOGRAM (TEE) NOTE  INDICATIONS: cryptogenic stroke  PROCEDURE:   Informed consent was obtained prior to the procedure. The risks, benefits and alternatives for the procedure were discussed and the patient comprehended these risks.  Risks include, but are not limited to, cough, sore throat, vomiting, nausea, somnolence, esophageal and stomach trauma or perforation, bleeding, low blood pressure, aspiration, pneumonia, infection, trauma to the teeth and death.    After a procedural time-out, the patient was given 6 mg versed and 100 mcg fentanyl, plus 25 mg IV benadryl for moderate sedation.  The patient's heart rate, blood pressure, and oxygen saturation are monitored continuously during the procedure.The oropharynx was anesthetized with 2 topical cetacaine sprays.  The transesophageal probe was inserted in the esophagus and stomach without difficulty and multiple views were obtained.  The patient was kept under observation until the patient left the procedure room.  The period of conscious sedation is 34 minutes, of which I was present face-to-face 100% of this time. The patient left the procedure room in stable condition.   Agitated microbubble saline contrast was administered.  COMPLICATIONS:    There were no immediate complications.  Findings:  1. LEFT VENTRICLE: The left ventricular wall thickness is normal.  The left ventricular cavity is normal in size. Wall motion is normal.  LVEF is 55-60%.  2. RIGHT VENTRICLE:  The right ventricle is normal in structure and function without any thrombus or masses.    3. LEFT ATRIUM:  The left atrium is normal in size without any thrombus or masses.  There is not spontaneous echo contrast ("smoke") in the left atrium consistent with a low flow state.  4. LEFT ATRIAL APPENDAGE:  The left atrial appendage is free of any thrombus or masses. The appendage has single lobes. Pulse doppler indicates high flow in the appendage.   5. ATRIAL SEPTUM:  The atrial septum is aneurysmal. There is brisk interatrial shunting by color doppler and saline microbubble from right to left, both spontaneously and with provocation, suggestive of moderate-sized PFO. This was visualized with 2D/3D echo and measures 1 x 2 cm in size.  6. RIGHT ATRIUM:  The right atrium is normal in size and function without any thrombus or masses.  7. MITRAL VALVE:  The mitral valve is normal in structure and function with trivial regurgitation.  No prolapse is noted. There were no vegetations or stenosis.  8. AORTIC VALVE:  The aortic valve is trileaflet, normal in structure and function with no regurgitation.  There were no vegetations or stenosis  9. TRICUSPID VALVE:  The tricuspid valve is normal in structure and function with trivial regurgitation.  There were no vegetations or stenosis  10.  PULMONIC VALVE:  The pulmonic valve is normal in structure and function with trivial regurgitation.  There were no vegetations or stenosis.   11. AORTIC ARCH, ASCENDING AND DESCENDING AORTA:  There was n Ron Parker et. Al, 1992) atherosclerosis of the ascending aorta, aortic arch, or proximal descending aorta.  12. PULMONARY VEINS: Anomalous pulmonary venous return was not noted.  13. PERICARDIUM: The pericardium appeared normal and non-thickened.  There is no pericardial effusion.  IMPRESSION:   1. Moderate-sized PFO with left to right shunting by saline microbubble contrast and color doppler. 2. LVEF 55-60%  RECOMMENDATIONS:    1.  Will consult with Dr. Burt Knack about PFO closure given history of stroke.  Time Spent Directly with the Patient:  60 minutes   Pixie Casino, MD, [facility], [place]  [facility] of the Advanced Lipid Disorders &  Cardiovascular Risk Reduction Clinic Diplomate of the American Board of Clinical Lipidology Attending Cardiologist  Direct Dial: [phone]  Fax: [phone]  Website:   [email or web address] 01/04/2019, 9:50 AM

## 2019-01-04 NOTE — H&P (Signed)
    INTERVAL PROCEDURE H&P  History and Physical Interval Note:  01/04/2019 8:37 AM  Glenda Evans has presented today for their planned procedure. The various methods of treatment have been discussed with the patient and family. After consideration of risks, benefits and other options for treatment, the patient has consented to the procedure.  The patients' outpatient history has been reviewed, patient examined, and no change in status from most recent office note within the past 30 days. I have reviewed the patients' chart and labs and will proceed as planned. Questions were answered to the patient's satisfaction.   Pixie Casino, MD, [facility], [place] Director of the Advanced Lipid Disorders &  Cardiovascular Risk Reduction Clinic Diplomate of the American Board of Clinical Lipidology Attending Cardiologist  Direct Dial: [phone]  Fax: [phone]  Website:  [email or web address] 01/04/2019, 8:37 AM

## 2019-01-04 NOTE — Discharge Instructions (Signed)

## 2019-01-04 NOTE — Telephone Encounter (Signed)
Pt called with c/o headache Per pt had TEE earlier in day and wanted to make sure was okay to take Aleve as directed for h/a Informed pt that this was okay to take as directed for h/a Pt verbalized understanding ./cy

## 2019-01-04 NOTE — Progress Notes (Signed)
  Echocardiogram 2D Echocardiogram has been performed.  Glenda Evans 01/04/2019, 9:43 AM

## 2019-01-08 ENCOUNTER — Telehealth: Payer: Self-pay

## 2019-01-08 NOTE — Telephone Encounter (Signed)
PFO consult scheudled 10/16 with Dr. Burt Knack. The patient was grateful for call agrees with treatment plan.

## 2019-01-23 ENCOUNTER — Ambulatory Visit
Admission: RE | Admit: 2019-01-23 | Discharge: 2019-01-23 | Disposition: A | Discharge status: Home / Self Care | Payer: Medicare Other | Source: Ambulatory Visit | Attending: Obstetrics and Gynecology | Admitting: Obstetrics and Gynecology

## 2019-01-23 ENCOUNTER — Ambulatory Visit: Payer: Medicare Other

## 2019-01-23 ENCOUNTER — Other Ambulatory Visit: Payer: Self-pay

## 2019-01-23 DIAGNOSIS — Z1231 Encounter for screening mammogram for malignant neoplasm of breast: Secondary | ICD-10-CM | POA: Diagnosis not present

## 2019-02-02 ENCOUNTER — Encounter: Payer: Self-pay | Admitting: Cardiovascular Disease

## 2019-02-02 ENCOUNTER — Ambulatory Visit (INDEPENDENT_AMBULATORY_CARE_PROVIDER_SITE_OTHER): Payer: Medicare Other | Admitting: Cardiovascular Disease

## 2019-02-02 ENCOUNTER — Other Ambulatory Visit: Payer: Self-pay

## 2019-02-02 VITALS — BP 126/88 | HR 68 | Ht 65.0 in | Wt 215.6 lb

## 2019-02-02 DIAGNOSIS — Q211 Atrial septal defect: Secondary | ICD-10-CM | POA: Diagnosis not present

## 2019-02-02 DIAGNOSIS — Q2112 Patent foramen ovale: Secondary | ICD-10-CM

## 2019-02-02 MED ORDER — CLOPIDOGREL BISULFATE 75 MG PO TABS: 75.0000 mg | ORAL_TABLET | Freq: Every day | ORAL | 0 refills | Status: AC

## 2019-02-02 MED ORDER — PANTOPRAZOLE SODIUM 40 MG PO TBEC: 40.0000 mg | DELAYED_RELEASE_TABLET | Freq: Every day | ORAL | 3 refills | Status: DC

## 2019-02-02 NOTE — Progress Notes (Signed)
[facility]   STRUCTURAL HEART TEAM  Date:  02/02/2019   ID:  ARBELL LEITH, DOB 11-Mar-1956, MRN [id number]  PCP:  Shirline Frees, MD   Chief Complaint  Patient presents with  . pfo     HISTORY OF PRESENT ILLNESS: Glenda Evans is a 63 y.o. female who presents for evaluation of PFO, referred by Dr Debara Pickett. The patient has a history of TIA in 2019. She was visiting a family member at [facility] when she suddenly developed vision loss and collapse. She later had right sided weakness and numbness. She was seen by Dr Leonie Man in outpatient consultation in June 2019 and ongoing medical therapy was recommended with a focus on CV risk reduction. She has had no recurrent stroke/TIA symptoms. She admits to intermittent chest pain and heaviness but no exertional component. No orthopnea, PND, or shortness of breath. Intermittent palpitations are present but unchanged.   The patient denies any history of nickel allergy or reaction.  Past Medical History:  Diagnosis Date  . Allergy   . Anxiety   . Arthritis   . Asthma   . Cataract    right eye   . Depression   . Fatigue   . Fibromyalgia   . GERD (gastroesophageal reflux disease)   . Heart murmur   . HLD (hyperlipidemia)   . Hyperglycemia   . Hypertension   . Hypothyroidism   . IBS (irritable bowel syndrome)   . Insomnia   . Migraine   . Mitral valve prolapse   . Osteopenia   . Pre-diabetes   . PVC (premature ventricular contraction)   . Stein-Leventhal syndrome   . Wears glasses     Current Outpatient Medications  Medication Sig Dispense Refill  . albuterol (PROVENTIL HFA;VENTOLIN HFA) 108 (90 BASE) MCG/ACT inhaler Inhale 2 puffs into the lungs every 6 (six) hours as needed for wheezing or shortness of breath.     Marland Kitchen aspirin EC 81 MG tablet Take 81 mg by mouth daily.    Marland Kitchen atorvastatin (LIPITOR) 40 MG tablet Take 1 tablet (40 mg total) by mouth daily at 6 PM. 30 tablet 11  . BLACK COHOSH PO Take 1 capsule  by mouth daily.     . clonazePAM (KLONOPIN) 0.5 MG tablet Take 0.5 mg by mouth See admin instructions. Take 0.5 mg at night, may take a 0.5 mg dose during the day as needed for anxiety  0  . diltiazem (CARDIZEM CD) 240 MG 24 hr capsule TAKE 1 CAPSULE BY MOUTH EVERY DAY **PLEASE KEEP APPT FOR MORE REFILLS** 90 capsule 1  . DULoxetine (CYMBALTA) 60 MG capsule Take 120 mg by mouth daily.   3  . KLOR-CON M20 20 MEQ tablet Take 20 mEq by mouth daily.   5  . levothyroxine (SYNTHROID, LEVOTHROID) 125 MCG tablet Take 125 mcg by mouth daily before breakfast.    . loratadine (CLARITIN) 10 MG tablet Take 10 mg by mouth daily.     Marland Kitchen losartan (COZAAR) 25 MG tablet Take 4 tablets (100 mg total) by mouth daily. 360 tablet 3  . metFORMIN (GLUCOPHAGE-XR) 500 MG 24 hr tablet Take 500 mg by mouth daily.     . Multiple Vitamin (MULTIVITAMIN WITH MINERALS) TABS tablet Take 1 tablet by mouth daily.     . naproxen sodium (ALEVE) 220 MG tablet Take 440 mg by mouth daily as needed (pain).    . Nutritional Supplements (ESTROVEN PO) Take 1 tablet by mouth daily.     Marland Kitchen  omeprazole (PRILOSEC) 40 MG capsule Take 40 mg by mouth daily.    . Vitamin D, Ergocalciferol, (DRISDOL) 50000 UNITS CAPS capsule Take 50,000 Units by mouth every Sunday.      Current Facility-Administered Medications  Medication Dose Route Frequency Provider Last Rate Last Dose  . triamcinolone acetonide (KENALOG) 10 MG/ML injection 10 mg  10 mg Other Once Landis Martins, DPM        ALLERGIES:   Augmentin [amoxicillin-pot clavulanate], Erythromycin, Lyrica [pregabalin], Macrobid [nitrofurantoin macrocrystal], Metaxalone, Neurontin [gabapentin], Ofloxacin, Pamelor, Relafen [nabumetone], Septra [bactrim], Sulfasalazine, and Escitalopram oxalate   SOCIAL HISTORY:  The patient  reports that she has never smoked. She has never used smokeless tobacco. She reports that she does not drink alcohol or use drugs.   FAMILY HISTORY:  The patient's family history  includes Breast cancer in her maternal grandmother; COPD in her father, maternal aunt, and maternal grandmother; Colon polyps in her father, maternal aunt, maternal grandmother, mother, and sister; Diabetes in her maternal aunt, maternal grandmother, and sister; Heart disease in her maternal grandmother, mother, and sister; Hypertension in her father; Ovarian cancer in her paternal aunt.   REVIEW OF SYSTEMS:  Positive for stress, palpitations.   All other systems are reviewed and negative.   PHYSICAL EXAM: VS:  BP 126/88   Pulse 68   Ht 5\' 5"  (1.651 m)   Wt 215 lb 9.6 oz (97.8 kg)   SpO2 95%   BMI 35.88 kg/m  , BMI Body mass index is 35.88 kg/m. GEN: Well nourished, well developed, in no acute distress HEENT: normal Neck: No JVD. carotids 2+ without bruits or masses Cardiac: The heart is RRR without murmurs, rubs, or gallops. No edema. Pedal pulses 2+ = bilaterally  Respiratory:  clear to auscultation bilaterally GI: soft, nontender, nondistended, + BS MS: no deformity or atrophy Skin: warm and dry, no rash Neuro:  Strength and sensation are intact Psych: euthymic mood, full affect  EKG:  EKG from today reviewed and demonstrates NSR 68 bpm, left axis deviation, otherwise normal  RECENT LABS: 01/01/2019: BUN 16; Creatinine, Ser 0.94; Hemoglobin 14.5; Platelets 353; Potassium 4.0; Sodium 145  07/10/2018: Chol/HDL Ratio 4.9; Cholesterol, Total 199; HDL 41; LDL Calculated 112; LDL Direct 128; Triglycerides 231   CrCl cannot be calculated (Patient's most recent lab result is older than the maximum 21 days allowed.).   Wt Readings from Last 3 Encounters:  02/02/19 215 lb 9.6 oz (97.8 kg)  01/04/19 213 lb (96.6 kg)  12/06/18 213 lb 6.4 oz (96.8 kg)     PERTINENT STUDIES:  Echo: 08/21/2015: Study Conclusions  - Left ventricle: The cavity size was normal. Systolic function was   normal. The estimated ejection fraction was in the range of 55%   to 60%. Wall motion was normal; there  were no regional wall   motion abnormalities. Doppler parameters are consistent with   abnormal left ventricular relaxation (grade 1 diastolic   dysfunction).   TEE: IMPRESSIONS    1. Left ventricular ejection fraction, by visual estimation, is 55 to 60%. The left ventricle has normal function. Left ventricular septal wall thickness was normal. Normal left ventricular posterior wall thickness. There is no left ventricular  hypertrophy.  2. Global right ventricle has normal systolic function.The right ventricular size is normal. No increase in right ventricular wall thickness.  3. Left atrial size was normal.  4. Right atrial size was normal.  5. The mitral valve is grossly normal. Trace mitral valve regurgitation.  6. The  tricuspid valve is grossly normal. Tricuspid valve regurgitation is trivial.  7. The aortic valve The aortic valve is tricuspid Aortic valve regurgitation was not visualized by color flow Doppler.  8. The pulmonic valve was grossly normal. Pulmonic valve regurgitation is not visualized by color flow Doppler.  9. Moderately sized patent foramen ovale with predominantly right to left shunting across the atrial septum. Visualized in 2D/3D modes. 10. Evidence of atrial level shunting detected by color flow Doppler.  ASSESSMENT AND PLAN: 1.  PFO with atrial septal aneurysm: Radiographic results, hospital notes, lab results, and cardiac imaging data are reviewed. TEE images are reviewed and demonstrate a moderate PFO with associated atrial septum aneurysm, positive R-->L shunt by agitated saline evaluation. Normal LV function and no significant valvular disease.    The patient is counseled about the association of PFO and cryptogenic stroke. Available clinical trial data is reviewed, specifically those trials comparing transcatheter PFO closure and medical therapy with antiplatelet drugs. The patient understands the potential benefit of PFO closure with respect to secondary  stroke reduction compared with medical therapy alone. She also understands that the presence of diabetes and hypertension increase her risk of 'non-PFO-related stroke.' Specific risks of transcatheter PFO closure are reviewed with the patient. These risks include bleeding, infection, device embolization, stroke, cardiac perforation, tamponade, arrhythmia, MI, and late device erosion. She understands these serious risks occur at low incidence of < 1%.  The patient understands the need to take dual antiplatelet therapy with aspirin and clopidogrel together for a minimum period of 3 months following transcatheter PFO closure.  They understand the need to follow SBE prophylaxis per guidelines for a period of 6 months following PFO closure.  After discussion of pros and cons the patient would like to proceed with transcatheter PFO closure. The patient provides full informed consent for the procedure.   Sherren Mocha 02/02/2019 11:30 AM

## 2019-02-02 NOTE — Patient Instructions (Addendum)
Medication Instructions:  1) START PLAVIX 75 mg daily at least 5 days prior to your procedure  2) STOP OMEPRAZOLE 3) START PROTONIX 40 mg daily   COVID INSTRUCTIONS: You are scheduled for your COVID SCREENING on Monday, February 12, 2019 at 3:00PM (the site closes at 3:15PM). [place] [facility] ([facility]) [address] Stay in the RIGHT lane and tell them you are there for pre-procedure testing   PFO CLOSURE INFORMATION: You are scheduled for a PFO CLOSURE on Thursday, February 15, 2019.  1. Please arrive at the [facility] (Main Entrance A) at [facility]: [address] [place], [place] [address] at: 5:30AM. You are allowed ONE visitor in the hospital. Both you and your visitor must wear masks. Special note: Every effort is made to have your procedure done on time. Please understand that emergencies sometimes delay scheduled procedures.  2. Diet: Do not eat solid foods after midnight.  The patient may have clear liquids until 5am upon the day of the procedure.  3. Labs: You will need to have blood drawn on TODAY!  4. Medication instructions in preparation for your procedure:  1) MAKE SURE TO TAKE ASPIRIN AND PLAVIX the morning of your procedure.  2) Hold Metformin the morning of your procedure.  2) You may take your other medications as directed with sips of water.  5. Plan for one night stay--bring personal belongings. 6. Bring a current list of your medications and current insurance cards. 7. You MUST have a responsible person to drive you home. 8. Someone MUST be with you the first 24 hours after you arrive home or your discharge will be delayed. 9. Please wear clothes that are easy to get on and off and wear slip-on shoes.   FOLLOW-UP: You are scheduled for your 1 month visit on 03/22/2019 at 1:30PM with [name], [place].

## 2019-02-03 LAB — CBC WITH DIFFERENTIAL/PLATELET
Basophils Absolute: 0.1 10*3/uL (ref 0.0–0.2)
Basos: 1 %
EOS (ABSOLUTE): 0.1 10*3/uL (ref 0.0–0.4)
Eos: 1 %
Hematocrit: 41.4 % (ref 34.0–46.6)
Hemoglobin: 14 g/dL (ref 11.1–15.9)
Immature Grans (Abs): 0 10*3/uL (ref 0.0–0.1)
Immature Granulocytes: 0 %
Lymphocytes Absolute: 3.7 10*3/uL — ABNORMAL HIGH (ref 0.7–3.1)
Lymphs: 39 %
MCH: 31.4 pg (ref 26.6–33.0)
MCHC: 33.8 g/dL (ref 31.5–35.7)
MCV: 93 fL (ref 79–97)
Monocytes Absolute: 0.7 10*3/uL (ref 0.1–0.9)
Monocytes: 7 %
Neutrophils Absolute: 5 10*3/uL (ref 1.4–7.0)
Neutrophils: 52 %
Platelets: 376 10*3/uL (ref 150–450)
RBC: 4.46 x10E6/uL (ref 3.77–5.28)
RDW: 12.8 % (ref 11.7–15.4)
WBC: 9.5 10*3/uL (ref 3.4–10.8)

## 2019-02-03 LAB — BASIC METABOLIC PANEL
BUN/Creatinine Ratio: 19 (ref 12–28)
BUN: 14 mg/dL (ref 8–27)
CO2: 26 mmol/L (ref 20–29)
Calcium: 9.7 mg/dL (ref 8.7–10.3)
Chloride: 102 mmol/L (ref 96–106)
Creatinine, Ser: 0.75 mg/dL (ref 0.57–1.00)
GFR calc Af Amer: 100 mL/min/{1.73_m2} (ref >59)
GFR calc non Af Amer: 87 mL/min/{1.73_m2} (ref >59)
Glucose: 107 mg/dL — ABNORMAL HIGH (ref 65–99)
Potassium: 3.9 mmol/L (ref 3.5–5.2)
Sodium: 143 mmol/L (ref 134–144)

## 2019-02-06 DIAGNOSIS — D2262 Melanocytic nevi of left upper limb, including shoulder: Secondary | ICD-10-CM | POA: Diagnosis not present

## 2019-02-06 DIAGNOSIS — L738 Other specified follicular disorders: Secondary | ICD-10-CM | POA: Diagnosis not present

## 2019-02-06 DIAGNOSIS — D225 Melanocytic nevi of trunk: Secondary | ICD-10-CM | POA: Diagnosis not present

## 2019-02-06 DIAGNOSIS — D2272 Melanocytic nevi of left lower limb, including hip: Secondary | ICD-10-CM | POA: Diagnosis not present

## 2019-02-06 DIAGNOSIS — D2261 Melanocytic nevi of right upper limb, including shoulder: Secondary | ICD-10-CM | POA: Diagnosis not present

## 2019-02-06 DIAGNOSIS — D485 Neoplasm of uncertain behavior of skin: Secondary | ICD-10-CM | POA: Diagnosis not present

## 2019-02-06 DIAGNOSIS — L821 Other seborrheic keratosis: Secondary | ICD-10-CM | POA: Diagnosis not present

## 2019-02-06 DIAGNOSIS — L858 Other specified epidermal thickening: Secondary | ICD-10-CM | POA: Diagnosis not present

## 2019-02-06 DIAGNOSIS — D1801 Hemangioma of skin and subcutaneous tissue: Secondary | ICD-10-CM | POA: Diagnosis not present

## 2019-02-12 ENCOUNTER — Other Ambulatory Visit (HOSPITAL_COMMUNITY)
Admission: RE | Admit: 2019-02-12 | Discharge: 2019-02-12 | Disposition: A | Discharge status: Home / Self Care | Payer: Medicare Other | Source: Ambulatory Visit | Attending: Cardiovascular Disease | Admitting: Cardiovascular Disease

## 2019-02-12 DIAGNOSIS — Z20828 Contact with and (suspected) exposure to other viral communicable diseases: Secondary | ICD-10-CM | POA: Diagnosis not present

## 2019-02-12 DIAGNOSIS — Z01812 Encounter for preprocedural laboratory examination: Secondary | ICD-10-CM | POA: Diagnosis not present

## 2019-02-13 ENCOUNTER — Telehealth: Payer: Self-pay | Admitting: *Deleted

## 2019-02-13 LAB — NOVEL CORONAVIRUS, NAA (HOSP ORDER, SEND-OUT TO REF LAB; TAT 18-24 HRS): SARS-CoV-2, NAA: NOT DETECTED

## 2019-02-13 NOTE — Telephone Encounter (Signed)
Pt contacted pre-PFO closure scheduled at [facility] for: Thursday February 15, 2019 7:30 AM Verified arrival time and place: [facility]) at: 5:30 AM   No solid food after midnight prior to cath, clear liquids until 5 AM day of procedure.  Hold: Metformin-AM of procedure  Except hold medications AM meds can be  taken pre-cath with sip of water including: ASA 81 mg Plavix 75 mg  Confirmed patient has responsible adult to drive home post procedure and observe 24 hours after arriving home: yes  Currently, due to Covid-19 pandemic, only one support person will be allowed with patient. Must be the same support person for that patient's entire stay, will be screened and required to wear a mask. They will be asked to wait in the waiting room for the duration of the patient's stay.  Patients are required to wear a mask when they enter the hospital.      COVID-19 Pre-Screening Questions:  . In the past 7 to 10 days have you had a cough,  shortness of breath, headache, congestion, fever (100 or greater) body aches, chills, sore throat, or sudden loss of taste or sense of smell? no . Have you been around anyone with known Covid 19? no . Have you been around anyone who is awaiting Covid 19 test results in the past 7 to 10 days? no . Have you been around anyone who has been exposed to Covid 19, or has mentioned symptoms of Covid 19 within the past 7 -10 days? no  I reviewed procedure/mask/visitor instructions with patient, she verbalized understanding, thanked me for call.

## 2019-02-15 ENCOUNTER — Other Ambulatory Visit: Payer: Self-pay

## 2019-02-15 ENCOUNTER — Ambulatory Visit (HOSPITAL_COMMUNITY)
Admission: RE | Admit: 2019-02-15 | Discharge: 2019-02-15 | Disposition: A | Discharge status: Home / Self Care | Payer: Medicare Other | Attending: Cardiovascular Disease | Admitting: Cardiovascular Disease

## 2019-02-15 ENCOUNTER — Encounter (HOSPITAL_COMMUNITY): Payer: Self-pay | Admitting: Cardiovascular Disease

## 2019-02-15 ENCOUNTER — Ambulatory Visit (HOSPITAL_BASED_OUTPATIENT_CLINIC_OR_DEPARTMENT_OTHER): Payer: Medicare Other

## 2019-02-15 ENCOUNTER — Encounter (HOSPITAL_COMMUNITY)
Admission: RE | Disposition: A | Discharge status: Home / Self Care | Payer: Self-pay | Source: Home / Self Care | Attending: Cardiovascular Disease

## 2019-02-15 DIAGNOSIS — I10 Essential (primary) hypertension: Secondary | ICD-10-CM | POA: Diagnosis not present

## 2019-02-15 DIAGNOSIS — Q2112 Patent foramen ovale: Secondary | ICD-10-CM

## 2019-02-15 DIAGNOSIS — Q211 Atrial septal defect: Secondary | ICD-10-CM

## 2019-02-15 DIAGNOSIS — I313 Pericardial effusion (noninflammatory): Secondary | ICD-10-CM | POA: Diagnosis not present

## 2019-02-15 DIAGNOSIS — I341 Nonrheumatic mitral (valve) prolapse: Secondary | ICD-10-CM | POA: Insufficient documentation

## 2019-02-15 DIAGNOSIS — E785 Hyperlipidemia, unspecified: Secondary | ICD-10-CM | POA: Diagnosis not present

## 2019-02-15 DIAGNOSIS — G459 Transient cerebral ischemic attack, unspecified: Secondary | ICD-10-CM | POA: Diagnosis not present

## 2019-02-15 HISTORY — PX: PATENT FORAMEN OVALE(PFO) CLOSURE: CATH118300

## 2019-02-15 LAB — ECHOCARDIOGRAM LIMITED
Height: 65 in
Weight: 3440 oz

## 2019-02-15 LAB — POCT ACTIVATED CLOTTING TIME
Activated Clotting Time: 246 seconds
Activated Clotting Time: 169 seconds
Activated Clotting Time: 219 seconds

## 2019-02-15 LAB — GLUCOSE, CAPILLARY: Glucose-Capillary: 110 mg/dL — ABNORMAL HIGH (ref 70–99)

## 2019-02-15 SURGERY — PATENT FORAMEN OVALE (PFO) CLOSURE
Anesthesia: LOCAL

## 2019-02-15 MED ORDER — VANCOMYCIN HCL IN DEXTROSE 1-5 GM/200ML-% IV SOLN
INTRAVENOUS | Status: AC
  Filled 2019-02-15: qty 200

## 2019-02-15 MED ORDER — SODIUM CHLORIDE 0.9 % WEIGHT BASED INFUSION: 3.0000 mL/kg/h | INTRAVENOUS | Status: AC

## 2019-02-15 MED ORDER — HEPARIN SODIUM (PORCINE) 1000 UNIT/ML IJ SOLN
INTRAMUSCULAR | Status: AC
  Filled 2019-02-15: qty 1

## 2019-02-15 MED ORDER — LIDOCAINE HCL (PF) 1 % IJ SOLN
INTRAMUSCULAR | Status: AC
  Filled 2019-02-15: qty 30

## 2019-02-15 MED ORDER — HEPARIN SODIUM (PORCINE) 1000 UNIT/ML IJ SOLN
INTRAMUSCULAR | Status: DC | PRN
  Administered 2019-02-15: 7000 [IU] via INTRAVENOUS

## 2019-02-15 MED ORDER — VANCOMYCIN HCL IN DEXTROSE 1-5 GM/200ML-% IV SOLN
1000.0000 mg | INTRAVENOUS | Status: AC
  Administered 2019-02-15: 1000 mg via INTRAVENOUS

## 2019-02-15 MED ORDER — CLINDAMYCIN HCL 300 MG PO CAPS: 600.0000 mg | ORAL_CAPSULE | ORAL | 0 refills | Status: AC

## 2019-02-15 MED ORDER — SODIUM CHLORIDE 0.9 % IV SOLN: 250.0000 mL | INTRAVENOUS | Status: DC | PRN

## 2019-02-15 MED ORDER — SODIUM CHLORIDE 0.9 % WEIGHT BASED INFUSION: 1.0000 mL/kg/h | INTRAVENOUS | Status: DC

## 2019-02-15 MED ORDER — CLOPIDOGREL BISULFATE 75 MG PO TABS: 75.0000 mg | ORAL_TABLET | ORAL | Status: AC

## 2019-02-15 MED ORDER — LIDOCAINE HCL 1 % IJ SOLN
INTRAMUSCULAR | Status: AC
  Filled 2019-02-15: qty 20

## 2019-02-15 MED ORDER — SODIUM CHLORIDE 0.9% FLUSH: 3.0000 mL | Freq: Two times a day (BID) | INTRAVENOUS | Status: DC

## 2019-02-15 MED ORDER — HEPARIN (PORCINE) IN NACL 1000-0.9 UT/500ML-% IV SOLN
INTRAVENOUS | Status: AC
  Filled 2019-02-15: qty 1000

## 2019-02-15 MED ORDER — LIDOCAINE HCL (PF) 1 % IJ SOLN
INTRAMUSCULAR | Status: DC | PRN
  Administered 2019-02-15: 20 mL

## 2019-02-15 MED ORDER — MIDAZOLAM HCL 2 MG/2ML IJ SOLN
INTRAMUSCULAR | Status: DC | PRN
  Administered 2019-02-15: 1 mg via INTRAVENOUS
  Administered 2019-02-15 (×2): 2 mg via INTRAVENOUS

## 2019-02-15 MED ORDER — FENTANYL CITRATE (PF) 100 MCG/2ML IJ SOLN
INTRAMUSCULAR | Status: DC | PRN
  Administered 2019-02-15: 50 ug via INTRAVENOUS
  Administered 2019-02-15 (×2): 25 ug via INTRAVENOUS

## 2019-02-15 MED ORDER — HEPARIN (PORCINE) IN NACL 1000-0.9 UT/500ML-% IV SOLN
INTRAVENOUS | Status: DC | PRN
  Administered 2019-02-15 (×2): 500 mL

## 2019-02-15 MED ORDER — MIDAZOLAM HCL 5 MG/5ML IJ SOLN
INTRAMUSCULAR | Status: AC
  Filled 2019-02-15: qty 5

## 2019-02-15 MED ORDER — SODIUM CHLORIDE 0.9% FLUSH: 3.0000 mL | INTRAVENOUS | Status: DC | PRN

## 2019-02-15 MED ORDER — ASPIRIN 81 MG PO CHEW: 81.0000 mg | CHEWABLE_TABLET | ORAL | Status: AC

## 2019-02-15 MED ORDER — FENTANYL CITRATE (PF) 100 MCG/2ML IJ SOLN
INTRAMUSCULAR | Status: AC
  Filled 2019-02-15: qty 2

## 2019-02-15 SURGICAL SUPPLY — 14 items
CATH ACUNAV REPROCESSED (CATHETERS) ×1 IMPLANT
CATH SUPER TORQUE PLUS 6F MPA1 (CATHETERS) ×1 IMPLANT
COVER SWIFTLINK CONNECTOR (BAG) ×1 IMPLANT
GUIDEWIRE AMPLATZER 1.5JX260 (WIRE) ×1 IMPLANT
GUIDEWIRE ANGLED .035X150CM (WIRE) ×1 IMPLANT
OCCLUDER AMPLATZER PFO 18MM (Prosthesis & Implant Heart) ×1 IMPLANT
PACK CARDIAC CATHETERIZATION (CUSTOM PROCEDURE TRAY) ×2 IMPLANT
PROTECTION STATION PRESSURIZED (MISCELLANEOUS) ×2
SHEATH INTROD W/O MIN 9FR 25CM (SHEATH) ×1 IMPLANT
SHEATH PINNACLE 8F 10CM (SHEATH) ×1 IMPLANT
SHEATH PROBE COVER 6X72 (BAG) ×1 IMPLANT
STATION PROTECTION PRESSURIZED (MISCELLANEOUS) IMPLANT
SYSTEM DELIVERY AMPLATZER 8FR (SHEATH) ×1 IMPLANT
WIRE EMERALD 3MM-J .035X150CM (WIRE) ×1 IMPLANT

## 2019-02-15 NOTE — Progress Notes (Signed)
Discharge instructions reviewed with pt and her sister both voice understanding.

## 2019-02-15 NOTE — Progress Notes (Signed)
  Echocardiogram 2D Echocardiogram limited has been performed.  Glenda Evans 02/15/2019, 10:39 AM

## 2019-02-15 NOTE — Progress Notes (Signed)
Site area: right groin  Site Prior to Removal:  Level 0  Pressure Applied For 0920  MINUTES    Minutes Beginning at 0940   Manual:   Yes.    Patient Status During Pull:  Stable   Post Pull Groin Site:  Level 0  Post Pull Instructions Given:  Yes.    Post Pull Pulses Present:  Yes.    Dressing Applied:  Yes.    Comments:  Bed rest started at 0940 X 4 hr. Instructions were given.

## 2019-02-15 NOTE — Progress Notes (Signed)
Pt had a little milk this am at [place], Rennis Harding, Rn called and informed

## 2019-02-15 NOTE — Progress Notes (Signed)
Ambulated in hallway and to the bathroom tol well

## 2019-02-15 NOTE — Progress Notes (Signed)
Ria Comment called and informed of last couple Bp readings no new orders at this time. Will monitor

## 2019-02-15 NOTE — Progress Notes (Signed)
Sister Larene Beach called and updated.

## 2019-02-15 NOTE — Discharge Instructions (Signed)
You will require antibiotics prior to any dental work, including cleanings, for 6 months after your PFO/ASD closure. This is to protect the device from potentially getting infected from bacteria in your mouth entering your bloodstream. The medication has been called into your pharmacy on file. Please pick this up to have ready before any scheduled dental work. Instructions will be outlined on the bottle. The medication should be taken 1 hour prior to your dental appointment. ° ° °Groin Site Care °Refer to this sheet in the next few weeks. These instructions provide you with information on caring for yourself after your procedure. Your caregiver may also give you more specific instructions. Your treatment has been planned according to current medical practices, but problems sometimes occur. Call your caregiver if you have any problems or questions after your procedure. °HOME CARE INSTRUCTIONS °· You may shower 24 hours after the procedure. Remove the bandage (dressing) and gently wash the site with plain soap and water. Gently pat the site dry.  °· Do not apply powder or lotion to the site.  °· Do not sit in a bathtub, swimming pool, or whirlpool for 5 to 7 days.  °· No bending, squatting, or lifting anything over 10 pounds (4.5 kg) for 1 week °· Inspect the site at least twice daily.  °· Do not drive home if you are discharged the same day of the procedure. Have someone else drive you.  °· You may drive 24 hours after the procedure unless otherwise instructed by your caregiver.  °What to expect: °· Any bruising will usually fade within 1 to 2 weeks.  °· Blood that collects in the tissue (hematoma) may be painful to the touch. It should usually decrease in size and tenderness within 1 to 2 weeks.  °SEEK IMMEDIATE MEDICAL CARE IF: °· You have unusual pain at the groin site or down the affected leg.  °· You have redness, warmth, swelling, or pain at the groin site.  °· You have drainage (other than a small amount of  blood on the dressing).  °· You have chills.  °· You have a fever or persistent symptoms for more than 72 hours.  °· You have a fever and your symptoms suddenly get worse.  °· Your leg becomes pale, cool, tingly, or numb.  °You have heavy bleeding from the site. Hold pressure on the site. ° °

## 2019-02-25 ENCOUNTER — Telehealth: Payer: Self-pay | Admitting: Physician Assistant

## 2019-02-25 ENCOUNTER — Emergency Department (HOSPITAL_COMMUNITY): Payer: Medicare Other

## 2019-02-25 ENCOUNTER — Other Ambulatory Visit: Payer: Self-pay

## 2019-02-25 ENCOUNTER — Emergency Department (HOSPITAL_COMMUNITY)
Admission: EM | Admit: 2019-02-25 | Discharge: 2019-02-26 | Disposition: A | Discharge status: Home / Self Care | Payer: Medicare Other | Attending: Emergency Medicine | Admitting: Emergency Medicine

## 2019-02-25 DIAGNOSIS — R072 Precordial pain: Secondary | ICD-10-CM | POA: Insufficient documentation

## 2019-02-25 DIAGNOSIS — Z79899 Other long term (current) drug therapy: Secondary | ICD-10-CM | POA: Diagnosis not present

## 2019-02-25 DIAGNOSIS — R260 Ataxic gait: Secondary | ICD-10-CM | POA: Diagnosis not present

## 2019-02-25 DIAGNOSIS — R270 Ataxia, unspecified: Secondary | ICD-10-CM

## 2019-02-25 DIAGNOSIS — I10 Essential (primary) hypertension: Secondary | ICD-10-CM | POA: Insufficient documentation

## 2019-02-25 DIAGNOSIS — R519 Headache, unspecified: Secondary | ICD-10-CM | POA: Insufficient documentation

## 2019-02-25 DIAGNOSIS — Z7901 Long term (current) use of anticoagulants: Secondary | ICD-10-CM | POA: Diagnosis not present

## 2019-02-25 DIAGNOSIS — R110 Nausea: Secondary | ICD-10-CM | POA: Diagnosis not present

## 2019-02-25 DIAGNOSIS — N390 Urinary tract infection, site not specified: Secondary | ICD-10-CM

## 2019-02-25 DIAGNOSIS — R197 Diarrhea, unspecified: Secondary | ICD-10-CM | POA: Diagnosis not present

## 2019-02-25 DIAGNOSIS — E039 Hypothyroidism, unspecified: Secondary | ICD-10-CM | POA: Insufficient documentation

## 2019-02-25 DIAGNOSIS — R202 Paresthesia of skin: Secondary | ICD-10-CM | POA: Diagnosis not present

## 2019-02-25 DIAGNOSIS — Z8673 Personal history of transient ischemic attack (TIA), and cerebral infarction without residual deficits: Secondary | ICD-10-CM | POA: Diagnosis not present

## 2019-02-25 DIAGNOSIS — J4520 Mild intermittent asthma, uncomplicated: Secondary | ICD-10-CM | POA: Insufficient documentation

## 2019-02-25 DIAGNOSIS — R531 Weakness: Secondary | ICD-10-CM | POA: Diagnosis not present

## 2019-02-25 LAB — URINALYSIS, ROUTINE W REFLEX MICROSCOPIC
Bilirubin Urine: NEGATIVE
Glucose, UA: NEGATIVE mg/dL
Hgb urine dipstick: NEGATIVE
Ketones, ur: NEGATIVE mg/dL
Nitrite: NEGATIVE
Protein, ur: 30 mg/dL — AB
Specific Gravity, Urine: 1.021 (ref 1.005–1.030)
WBC, UA: >50 WBC/hpf — ABNORMAL HIGH (ref 0–5)
pH: 5 (ref 5.0–8.0)

## 2019-02-25 LAB — CBC
HCT: 42 % (ref 36.0–46.0)
Hemoglobin: 14.3 g/dL (ref 12.0–15.0)
MCH: 31.4 pg (ref 26.0–34.0)
MCHC: 34 g/dL (ref 30.0–36.0)
MCV: 92.1 fL (ref 80.0–100.0)
Platelets: 400 10*3/uL (ref 150–400)
RBC: 4.56 MIL/uL (ref 3.87–5.11)
RDW: 12.6 % (ref 11.5–15.5)
WBC: 8.4 10*3/uL (ref 4.0–10.5)
nRBC: 0 % (ref 0.0–0.2)

## 2019-02-25 LAB — BASIC METABOLIC PANEL
Anion gap: 12 (ref 5–15)
BUN: 10 mg/dL (ref 6–20)
CO2: 24 mmol/L (ref 22–32)
Calcium: 9.3 mg/dL (ref 8.9–10.3)
Chloride: 107 mmol/L (ref 98–111)
Creatinine, Ser: 0.83 mg/dL (ref 0.44–1.00)
GFR calc Af Amer: >60 mL/min (ref >60)
GFR calc non Af Amer: >60 mL/min (ref >60)
Glucose, Bld: 82 mg/dL (ref 70–99)
Potassium: 3.3 mmol/L — ABNORMAL LOW (ref 3.5–5.1)
Sodium: 143 mmol/L (ref 135–145)

## 2019-02-25 LAB — TROPONIN I (HIGH SENSITIVITY): Troponin I (High Sensitivity): 6 ng/L (ref <18)

## 2019-02-25 MED ORDER — SODIUM CHLORIDE 0.9% FLUSH: 3.0000 mL | Freq: Once | INTRAVENOUS | Status: DC

## 2019-02-25 NOTE — ED Notes (Signed)
Glenda Evans (Husband#[phone])-Called/would like to come visit.

## 2019-02-25 NOTE — Telephone Encounter (Signed)
   The patient called the answering service after-hours today. She had a PFO closure on 02/15/19. She has felt very tired the last few days. She had a terrible migraine 2 days ago. 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 She noticed upon standing up her legs felt like jello. She began shaking all over. She could not bear weight on her legs. Her family had to carry her into the house because she "could not get her legs to work." She also felt very SOB. The SOB is easing off but she still feels very weak. She is now getting a significant headache/migraine as well. She is concerned because symptoms are extremely similar to her prior stroke. Per our discussion, advised her to proceed to ER - discussed that EMS would be best choice if symptoms felt similar to prior stroke, but above all she should not drive herself. She verbalized understanding/gratitude.   Charlie Pitter, PA-C

## 2019-02-25 NOTE — ED Triage Notes (Signed)
Pt reporting that she was outside today, and had onset of weakness in her legs, felt like she could not stand up. She says that her family assisted her to walk inside to lay down. She had onset of "migraine headache" that has since subsided. She denied unilateral weakness, reporting dizziness. Mae x 4, smile symmetrical. Pt does report she had recent PFO closure on the 29th.

## 2019-02-25 NOTE — ED Triage Notes (Signed)
Pt arrives via  [facility], she was outside spray painting, suddenly felt weak in her legs, "tingling in her head" went inside to sit down and her symptoms subsided. Increased weakness in bilateral legs when she stood up 154/104 standing, 140/106 sitting, HR 87, NSR, 97% RA, 134/92 last BP, CBG 92, temp 97.7.

## 2019-02-26 ENCOUNTER — Emergency Department (HOSPITAL_COMMUNITY): Payer: Medicare Other

## 2019-02-26 DIAGNOSIS — R260 Ataxic gait: Secondary | ICD-10-CM | POA: Diagnosis not present

## 2019-02-26 DIAGNOSIS — R270 Ataxia, unspecified: Secondary | ICD-10-CM | POA: Diagnosis not present

## 2019-02-26 LAB — CBG MONITORING, ED: Glucose-Capillary: 105 mg/dL — ABNORMAL HIGH (ref 70–99)

## 2019-02-26 LAB — TROPONIN I (HIGH SENSITIVITY): Troponin I (High Sensitivity): 5 ng/L

## 2019-02-26 MED ORDER — LORAZEPAM 1 MG PO TABS
1.0000 mg | ORAL_TABLET | Freq: Once | ORAL | Status: AC
  Administered 2019-02-26: 1 mg via ORAL
  Filled 2019-02-26: qty 1

## 2019-02-26 MED ORDER — FOSFOMYCIN TROMETHAMINE 3 G PO PACK
3.0000 g | PACK | Freq: Once | ORAL | Status: AC
  Administered 2019-02-26: 11:00:00 3 g via ORAL
  Filled 2019-02-26: qty 3

## 2019-02-26 NOTE — ED Notes (Signed)
Patient Alert and oriented to baseline. Stable and ambulatory to baseline. Patient verbalized understanding of the discharge instructions.  Patient belongings were taken by the patient.   

## 2019-02-26 NOTE — ED Provider Notes (Signed)
[facility] EMERGENCY DEPARTMENT Provider Note   CSN: [id number] Arrival date & time: 02/25/19  1730     History   Chief Complaint Chief Complaint  Patient presents with  . Weakness    HPI Glenda Evans is a 63 y.o. female.     The history is provided by the patient and the spouse.  Weakness Severity:  Severe Timing:  Constant Progression:  Improving Chronicity:  New Relieved by:  None tried Worsened by:  Nothing Associated symptoms: chest pain, dysuria, headaches and nausea   Associated symptoms: no cough, no fever, no shortness of breath and no vomiting   Patient presents with episode of weakness Patient has history of anxiety, fibromyalgia, IBS, recent PFO closure presents with episode of weakness.  She reports yesterday she was spraying paint outside She reports she started having weakness of both legs and tingling in her head.  She went to sit down and things improved.  When standing up her leg still felt weak and she had difficulty walking.  She reports she felt that her speech was slurred.  She has had recent headaches intermittently that are common for her.  No syncope.  No fevers or vomiting.  She is now feeling improved  Patient had a PFO closure on October 29.  She reports since that time she had intermittent sharp episodes of chest pain. She has been taking Plavix since the procedure She reports she had a stroke in February 2019 at [facility] No known history of CAD Past Medical History:  Diagnosis Date  . Allergy   . Anxiety   . Arthritis   . Asthma   . Cataract    right eye   . Depression   . Fatigue   . Fibromyalgia   . GERD (gastroesophageal reflux disease)   . Heart murmur   . HLD (hyperlipidemia)   . Hyperglycemia   . Hypertension   . Hypothyroidism   . IBS (irritable bowel syndrome)   . Insomnia   . Migraine   . Mitral valve prolapse   . Osteopenia   . Pre-diabetes   . PVC (premature ventricular  contraction)   . Stein-Leventhal syndrome   . Wears glasses     Patient Active Problem List   Diagnosis Date Noted  . TIA (transient ischemic attack) 06/22/2018  . PFO (patent foramen ovale) 06/22/2018  . Pain in both lower extremities 12/16/2016  . Bilateral leg edema 12/16/2016  . Mitral valve prolapse 05/14/2016  . Other fatigue 12/26/2015  . Snoring 12/26/2015  . Excessive daytime sleepiness 12/26/2015  . Palpitations 08/07/2015  . DOE (dyspnea on exertion) 08/07/2015  . PVC (premature ventricular contraction)   . Hyperglycemia   . Vasovagal syncope 06/30/2014  . Urinary retention 06/30/2014  . Essential hypertension 06/30/2014  . Hypothyroidism 06/30/2014  . Mixed hyperlipidemia 06/30/2014  . Low back pain 06/30/2014  . Fibromyalgia 06/30/2014  . Asthma 06/30/2014  . Asthma, mild intermittent   . Postconcussive syndrome 07/03/2013  . Gait instability 07/03/2013    Past Surgical History:  Procedure Laterality Date  . ABDOMINAL HYSTERECTOMY    . BUBBLE STUDY  01/04/2019   Procedure: BUBBLE STUDY;  Surgeon: Pixie Casino, MD;  Location: [facility];  Service: Cardiovascular;;  . CESAREAN SECTION     x 2  . CHOLECYSTECTOMY    . COLONOSCOPY    . LIPOMA EXCISION  11/26/10   thigh x3 cyst  . OVARY SURGERY     to  poke holes in ovaries for PCOD  . PATENT FORAMEN OVALE(PFO) CLOSURE N/A 02/15/2019   Procedure: PATENT FORAMEN OVALE (PFO) CLOSURE;  Surgeon: Sherren Mocha, MD;  Location: [facility];  Service: Cardiovascular;  Laterality: N/A;  . TEE WITHOUT CARDIOVERSION N/A 01/04/2019   Procedure: TRANSESOPHAGEAL ECHOCARDIOGRAM (TEE);  Surgeon: Pixie Casino, MD;  Location: [facility] ENDOSCOPY;  Service: Cardiovascular;  Laterality: N/A;  . TONSILLECTOMY       OB History   No obstetric history on file.      Home Medications    Prior to Admission medications   Medication Sig Start Date End Date Taking? Authorizing Provider  albuterol (PROVENTIL HFA;VENTOLIN  HFA) 108 (90 BASE) MCG/ACT inhaler Inhale 2 puffs into the lungs every 6 (six) hours as needed for wheezing or shortness of breath.     [provider]  aspirin EC 81 MG tablet Take 81 mg by mouth daily.    [provider]  atorvastatin (LIPITOR) 40 MG tablet Take 1 tablet (40 mg total) by mouth daily at 6 PM. Patient taking differently: Take 40 mg by mouth daily.  07/11/18   Hilty, Nadean Corwin, MD  Black Cohosh 40 MG CAPS Take 40 mg by mouth daily.     [provider]  clindamycin (CLEOCIN) 300 MG capsule Take 2 capsules (600 mg total) by mouth as directed. Take 2 capsules 1 hour prior to dental work, including routine cleaning. 02/15/19 08/16/19  Eileen Stanford, PA-C  clonazePAM (KLONOPIN) 0.5 MG tablet Take 0.5 mg by mouth at bedtime. take a 0.5 mg dose during the day as needed for anxiety 06/06/17   [provider]  clopidogrel (PLAVIX) 75 MG tablet Take 1 tablet (75 mg total) by mouth daily. 02/02/19 05/03/19  Sherren Mocha, MD  diltiazem (CARDIZEM CD) 240 MG 24 hr capsule TAKE 1 CAPSULE BY MOUTH EVERY DAY **PLEASE KEEP APPT FOR MORE REFILLS** Patient taking differently: Take 240 mg by mouth daily.  12/20/18   Hilty, Nadean Corwin, MD  DULoxetine (CYMBALTA) 60 MG capsule Take 120 mg by mouth daily.  06/23/17   [provider]  ferrous sulfate 325 (65 FE) MG tablet Take 325 mg by mouth daily with breakfast.    [provider]  KLOR-CON M20 20 MEQ tablet Take 20 mEq by mouth daily.  12/04/16   [provider]  levothyroxine (SYNTHROID, LEVOTHROID) 125 MCG tablet Take 125 mcg by mouth daily before breakfast.    [provider]  loratadine (CLARITIN) 10 MG tablet Take 10 mg by mouth daily as needed for allergies.  12/01/16   [provider]  losartan (COZAAR) 25 MG tablet Take 4 tablets (100 mg total) by mouth daily. 12/22/18   Hilty, Nadean Corwin, MD  metFORMIN (GLUCOPHAGE-XR) 500 MG 24 hr tablet Take 500 mg by mouth daily.   03/28/18   [provider]  Multiple Vitamin (MULTIVITAMIN WITH MINERALS) TABS tablet Take 1 tablet by mouth daily. [employer]    [provider]  naproxen sodium (ALEVE) 220 MG tablet Take 440 mg by mouth daily as needed (migraines).    [provider]  Nutritional Supplements (ESTROVEN PO) Take 1 tablet by mouth daily.     [provider]  pantoprazole (PROTONIX) 40 MG tablet Take 1 tablet (40 mg total) by mouth daily. 02/02/19 01/28/20  Sherren Mocha, MD  Vitamin D, Ergocalciferol, (DRISDOL) 50000 UNITS CAPS capsule Take 50,000 Units by mouth every Sunday.     [provider]  Family History Family History  Problem Relation Age of Onset  . COPD Father   . Hypertension Father   . Colon polyps Father   . Diabetes Sister   . Colon polyps Mother   . Heart disease Mother        MVP  . Breast cancer Maternal Grandmother   . Colon polyps Maternal Grandmother   . Diabetes Maternal Grandmother   . Heart disease Maternal Grandmother   . COPD Maternal Grandmother   . Ovarian cancer Paternal Aunt   . Colon polyps Sister   . Colon polyps Maternal Aunt        x 2  . Diabetes Maternal Aunt   . Heart disease Sister        MVP  . COPD Maternal Aunt   . Colon cancer Neg Hx   . Esophageal cancer Neg Hx   . Rectal cancer Neg Hx   . Stomach cancer Neg Hx     Social History Social History   Tobacco Use  . Smoking status: Never Smoker  . Smokeless tobacco: Never Used  Substance Use Topics  . Alcohol use: No    Alcohol/week: 0.0 standard drinks  . Drug use: No     Allergies   Augmentin [amoxicillin-pot clavulanate], Erythromycin, Lyrica [pregabalin], Macrobid [nitrofurantoin macrocrystal], Metaxalone, Neurontin [gabapentin], Ofloxacin, Pamelor, Relafen [nabumetone], Septra [bactrim], Sulfasalazine, and Escitalopram oxalate   Review of Systems Review of Systems  Constitutional: Negative for fever.  Eyes: Negative for visual  disturbance.  Respiratory: Negative for cough and shortness of breath.   Cardiovascular: Positive for chest pain.  Gastrointestinal: Positive for nausea. Negative for vomiting.  Genitourinary: Positive for dysuria.       Denies incontinence  Musculoskeletal: Negative for back pain and neck pain.  Neurological: Positive for speech difficulty, weakness and headaches. Negative for syncope.  All other systems reviewed and are negative.    Physical Exam Updated Vital Signs BP (!) 134/93   Pulse 72   Temp 98.1 F (36.7 C) (Oral)   Resp 14   Ht 1.651 m (5\' 5" )   Wt 97.5 kg   SpO2 97%   BMI 35.78 kg/m   Physical Exam CONSTITUTIONAL: Well developed/well nourished HEAD: Normocephalic/atraumatic EYES: EOMI/PERRL, no nystagmus,no ptosis ENMT: Mucous membranes moist NECK: supple no meningeal signs, no bruits CV: S1/S2 noted, no murmurs/rubs/gallops noted LUNGS: Lungs are clear to auscultation bilaterally, no apparent distress ABDOMEN: soft, nontender, no rebound or guarding GU:no cva tenderness NEURO:Awake/alert, face symmetric, no arm or leg drift is noted Equal 5/5 strength with shoulder abduction, elbow flex/extension, wrist flex/extension in upper extremities and equal hand grips bilaterally Equal 5/5 strength with hip flexion,knee flex/extension, foot dorsi/plantar flexion Cranial nerves 3/4/5/6/10/25/08/11/12 tested and intact Sensation to light touch intact in all extremities EXTREMITIES: pulses normal, full ROM SKIN: warm, color normal PSYCH: no abnormalities of mood noted   ED Treatments / Results  Labs (all labs ordered are listed, but only abnormal results are displayed) Labs Reviewed  BASIC METABOLIC PANEL - Abnormal; Notable for the following components:      Result Value   Potassium 3.3 (*)    All other components within normal limits  URINALYSIS, ROUTINE W REFLEX MICROSCOPIC - Abnormal; Notable for the following components:   Color, Urine AMBER (*)    APPearance  HAZY (*)    Protein, ur 30 (*)    Leukocytes,Ua MODERATE (*)    WBC, UA >50 (*)    Bacteria, UA RARE (*)    All  other components within normal limits  CBG MONITORING, ED - Abnormal; Notable for the following components:   Glucose-Capillary 105 (*)    All other components within normal limits  CBC  TROPONIN I (HIGH SENSITIVITY)  TROPONIN I (HIGH SENSITIVITY)    EKG EKG Interpretation  Date/Time:  Sunday February 25 2019 21:25:23 EST Ventricular Rate:  89 PR Interval:  126 QRS Duration: 86 QT Interval:  368 QTC Calculation: 447 R Axis:   -22 Text Interpretation: Normal sinus rhythm Normal ECG Confirmed by Ripley Fraise [phone]) on 02/26/2019 4:26:56 AM   Radiology Dg Chest 2 View  Result Date: 02/25/2019 CLINICAL DATA:  Weakness EXAM: CHEST - 2 VIEW COMPARISON:  March 10, 2018 FINDINGS: The heart size and mediastinal contours are within normal limits. Atrial septal device closure is noted. Both lungs are clear. The visualized skeletal structures are unremarkable. IMPRESSION: No active cardiopulmonary disease. Electronically Signed   By: Prudencio Pair M.D.   On: 02/25/2019 21:36    Procedures Procedures   Medications Ordered in ED Medications  sodium chloride flush (NS) 0.9 % injection 3 mL (has no administration in time range)     Initial Impression / Assessment and Plan / ED Course  I have reviewed the triage vital signs and the nursing notes.  Pertinent labs & imaging results that were available during my care of the patient were reviewed by me and considered in my medical decision making (see chart for details).        5:28 AM Patient presents with vague weakness over the past day.  She has no signs of acute stroke at this time.  She was evaluated [facility] in 2019 for possible stroke, MRI at that time showed no ischemic changes, it was felt that that episode was due to to a stressful episode while visiting her family were in the MICU  I reviewed  the case with cardiology fellow, reviewed EKG, labs and x-ray findings.  This not appear to be related to her recent PFO procedure  We will ambulate patient and reassess Patient may also have UTI  Chest pains improving, headache is improving 6:55 AM Patient has no focal weakness, but upon ambulation has a shuffling gait and is unstable. She has seen neurology before and was documented to have a stroke. Plan to have an MRI brain.  Her device is MRI compatible.  If negative she can be discharged home 7:02 AM Signed out to Dr Wilson Singer at shift change ?UTI, will send urine culture as pt has multiple drug allergies And she is afebrile, not septic appearing Final Clinical Impressions(s) / ED Diagnoses   Final diagnoses:  Ataxia  Precordial pain    ED Discharge Orders    None       Ripley Fraise, MD 02/26/19 [phone]

## 2019-02-26 NOTE — ED Notes (Signed)
RN ambulated pt in hall.  Pts gait was shuffled and unstable.  Provider aware.

## 2019-02-27 LAB — URINE CULTURE

## 2019-03-06 DIAGNOSIS — R8279 Other abnormal findings on microbiological examination of urine: Secondary | ICD-10-CM | POA: Diagnosis not present

## 2019-03-19 NOTE — Progress Notes (Signed)
[facility]                                       Cardiology Office Note    Date:  03/22/2019   ID:  Glenda Evans, DOB May 09, 1955, MRN [id number]  PCP:  Glenda Frees, MD  Cardiologist: Dr. Debara Pickett  CC: 1 month s/p PFO closure   History of Present Illness:  Glenda Evans is a 63 y.o. female with a history of anxiety, fibromyalgia, IBS, TIA and PFO s/p PFO closure (02/15/19) who presents to clinic for follow up.   The patient has a history of TIA in 2019. She was visiting a family member at [facility] when she suddenly developed vision loss and collapse. She later had right sided weakness and numbness. She was seen by Dr Leonie Man in outpatient consultation in June 2019 and ongoing medical therapy was recommended with a focus on CV risk reduction. She has had no recurrent stroke/TIA symptoms. TEE 01/04/2019 demonstrated a moderate PFO with associated atrial septum aneurysm, positive R-->L shunt by agitated saline evaluation. Normal LV function and no significant valvular disease and she was referred to Dr. Burt Knack for consideration of transcatheter PFO closure. She admited to intermittent chest pain and heaviness but no exertional component as well as palpitations at her initial consult with Dr. Burt Knack.   She underwent successful transcatheter PFO closure using an 18 mm Amplatzer PFO occluder device on 02/15/19. Post op echo showed EF55-60%, normal device function with trivial pericardial effusion. She was discharged on aspirin and plavix.   She was seen in the ER on 02/25/19 for evaluation of weakness, chest pains and HA. Apparently she had unstable and shuffling gait. Her work up (including brain MRI) was unremarkable and she was discharged home. She was treated with an oral Abx syrup in the ER for UTI. She said her BP (especially DBP) has been running high.  Today she presents to clinic for follow up. No CP. She had chronic dyspnea  2/2 asthma and she uses her inhaler.. No LE edema, orthopnea or PND. She has occasional dizziness but no syncope. No blood in stool or urine. No palpitations.    Past Medical History:  Diagnosis Date  . Allergy   . Anxiety   . Arthritis   . Asthma   . Cataract    right eye   . Depression   . Fatigue   . Fibromyalgia   . GERD (gastroesophageal reflux disease)   . Heart murmur   . HLD (hyperlipidemia)   . Hyperglycemia   . Hypertension   . Hypothyroidism   . IBS (irritable bowel syndrome)   . Insomnia   . Migraine   . Mitral valve prolapse   . Osteopenia   . Pre-diabetes   . PVC (premature ventricular contraction)   . Stein-Leventhal syndrome   . Wears glasses     Past Surgical History:  Procedure Laterality Date  . ABDOMINAL HYSTERECTOMY    . BUBBLE STUDY  01/04/2019   Procedure: BUBBLE STUDY;  Surgeon: Pixie Casino, MD;  Location: [facility];  Service: Cardiovascular;;  . CESAREAN SECTION     x 2  . CHOLECYSTECTOMY    . COLONOSCOPY    . LIPOMA EXCISION  11/26/10   thigh x3 cyst  . OVARY SURGERY     to poke holes in ovaries for PCOD  .  PATENT FORAMEN OVALE(PFO) CLOSURE N/A 02/15/2019   Procedure: PATENT FORAMEN OVALE (PFO) CLOSURE;  Surgeon: Sherren Mocha, MD;  Location: [facility] [facility];  Service: Cardiovascular;  Laterality: N/A;  . TEE WITHOUT CARDIOVERSION N/A 01/04/2019   Procedure: TRANSESOPHAGEAL ECHOCARDIOGRAM (TEE);  Surgeon: Pixie Casino, MD;  Location: [facility] ENDOSCOPY;  Service: Cardiovascular;  Laterality: N/A;  . TONSILLECTOMY      Current Medications: Outpatient Medications Prior to Visit  Medication Sig Dispense Refill  . ACCU-CHEK GUIDE test strip USE AS DIRECTED ONCE A DAY    . albuterol (PROVENTIL HFA;VENTOLIN HFA) 108 (90 BASE) MCG/ACT inhaler Inhale 2 puffs into the lungs every 6 (six) hours as needed for wheezing or shortness of breath.     Marland Kitchen aspirin EC 81 MG tablet Take 81 mg by mouth daily.    Marland Kitchen atorvastatin (LIPITOR) 40 MG tablet  Take 40 mg by mouth daily.    . Black Cohosh 40 MG CAPS Take 40 mg by mouth daily.     . clindamycin (CLEOCIN) 300 MG capsule Take 2 capsules (600 mg total) by mouth as directed. Take 2 capsules 1 hour prior to dental work, including routine cleaning. 8 capsule 0  . clonazePAM (KLONOPIN) 0.5 MG tablet Take 0.5 mg by mouth at bedtime. take a 0.5 mg dose during the day as needed for anxiety  0  . clopidogrel (PLAVIX) 75 MG tablet Take 1 tablet (75 mg total) by mouth daily. 90 tablet 0  . DULoxetine (CYMBALTA) 60 MG capsule Take 120 mg by mouth daily.   3  . levothyroxine (SYNTHROID, LEVOTHROID) 125 MCG tablet Take 125 mcg by mouth daily before breakfast.    . loratadine (CLARITIN) 10 MG tablet Take 10 mg by mouth daily as needed for allergies.     Marland Kitchen losartan (COZAAR) 25 MG tablet Take 4 tablets (100 mg total) by mouth daily. 360 tablet 3  . metFORMIN (GLUCOPHAGE-XR) 500 MG 24 hr tablet Take 500 mg by mouth daily.     . Multiple Vitamin (MULTIVITAMIN WITH MINERALS) TABS tablet Take 1 tablet by mouth daily. Vita Fusion    . naproxen sodium (ALEVE) 220 MG tablet Take 440 mg by mouth daily as needed (migraines).    . Nutritional Supplements (ESTROVEN PO) Take 1 tablet by mouth daily.     . pantoprazole (PROTONIX) 40 MG tablet Take 1 tablet (40 mg total) by mouth daily. 90 tablet 3  . Potassium Chloride ER 20 MEQ TBCR Take 1 tablet by mouth daily.    . Vitamin D, Ergocalciferol, (DRISDOL) 50000 UNITS CAPS capsule Take 50,000 Units by mouth every Sunday.     . diltiazem (CARDIZEM CD) 240 MG 24 hr capsule Take 240 mg by mouth daily.    Marland Kitchen atorvastatin (LIPITOR) 40 MG tablet Take 1 tablet (40 mg total) by mouth daily at 6 PM. (Patient taking differently: Take 40 mg by mouth daily. ) 30 tablet 11  . diltiazem (CARDIZEM CD) 240 MG 24 hr capsule TAKE 1 CAPSULE BY MOUTH EVERY DAY **PLEASE KEEP APPT FOR MORE REFILLS** (Patient taking differently: Take 240 mg by mouth daily. ) 90 capsule 1  . ferrous sulfate 325  (65 FE) MG tablet Take 325 mg by mouth daily with breakfast.    . KLOR-CON M20 20 MEQ tablet Take 20 mEq by mouth daily.   5   Facility-Administered Medications Prior to Visit  Medication Dose Route Frequency Provider Last Rate Last Dose  . triamcinolone acetonide (KENALOG) 10 MG/ML injection 10 mg  10 mg Other Once Landis Martins, DPM         Allergies:   Augmentin [amoxicillin-pot clavulanate], Erythromycin, Lyrica [pregabalin], Macrobid [nitrofurantoin macrocrystal], Metaxalone, Neurontin [gabapentin], Ofloxacin, Pamelor, Relafen [nabumetone], Septra [bactrim], Sulfasalazine, and Escitalopram oxalate   Social History   Socioeconomic History  . Marital status: Divorced    Spouse name: Not on file  . Number of children: 2  . Years of education: Not on file  . Highest education level: Not on file  Occupational History  . Occupation: disabled  Social Needs  . Financial resource strain: Not on file  . Food insecurity    Worry: Not on file    Inability: Not on file  . Transportation needs    Medical: Not on file    Non-medical: Not on file  Tobacco Use  . Smoking status: Never Smoker  . Smokeless tobacco: Never Used  Substance and Sexual Activity  . Alcohol use: No    Alcohol/week: 0.0 standard drinks  . Drug use: No  . Sexual activity: Not on file  Lifestyle  . Physical activity    Days per week: Not on file    Minutes per session: Not on file  . Stress: Not on file  Relationships  . Social Herbalist on phone: Not on file    Gets together: Not on file    Attends religious service: Not on file    Active member of club or organization: Not on file    Attends meetings of clubs or organizations: Not on file    Relationship status: Not on file  Other Topics Concern  . Not on file  Social History Narrative  . Not on file     Family History:  The patient's family history includes Breast cancer in her maternal grandmother; COPD in her father, maternal aunt,  and maternal grandmother; Colon polyps in her father, maternal aunt, maternal grandmother, mother, and sister; Diabetes in her maternal aunt, maternal grandmother, and sister; Heart disease in her maternal grandmother, mother, and sister; Hypertension in her father; Ovarian cancer in her paternal aunt.     ROS:   Please see the history of present illness.    ROS All other systems reviewed and are negative.   PHYSICAL EXAM:   VS:  BP (!) 150/108   Pulse 81   Ht 5\' 5"  (1.651 m)   Wt 213 lb 12.8 oz (97 kg)   SpO2 97%   BMI 35.58 kg/m    GEN: Well nourished, well developed, in no acute distress HEENT: normal Neck: no JVD or masses Cardiac: RRR; no murmurs, rubs, or gallops,no edema  Respiratory:  clear to auscultation bilaterally, normal work of breathing GI: soft, nontender, nondistended, + BS MS: no deformity or atrophy Skin: warm and dry, no rash Neuro:  Alert and Oriented x 3, Strength and sensation are intact Psych: euthymic mood, full affect   Wt Readings from Last 3 Encounters:  03/22/19 213 lb 12.8 oz (97 kg)  02/25/19 215 lb (97.5 kg)  02/15/19 215 lb (97.5 kg)      Studies/Labs Reviewed:   EKG:  EKG is ordered today.  The ekg ordered today demonstrates NSR HR 70, IRBBB, LAFB   Recent Labs: 02/25/2019: BUN 10; Creatinine, Ser 0.83; Hemoglobin 14.3; Platelets 400; Potassium 3.3; Sodium 143   Lipid Panel    Component Value Date/Time   CHOL 199 07/10/2018 0943   TRIG 231 (H) 07/10/2018 0943   HDL 41 07/10/2018 0943  CHOLHDL 4.9 (H) 07/10/2018 0943   LDLCALC 112 (H) 07/10/2018 0943   LDLDIRECT 128 (H) 07/10/2018 0943    Additional studies/ records that were reviewed today include:   02/15/2019 PATENT FORAMEN OVALE (PFO) CLOSURE  Conclusion Successful transcatheter PFO closure using an 18 mm Amplatzer PFO occluder device under intracardiac echo and fluoroscopic guidance  Recommendations  Antiplatelet/Anticoag Recommend uninterrupted dual antiplatelet  therapy with Aspirin 81mg  daily and Clopidogrel 75mg  daily for 3 months, then ASA 81 mg indefinitely.   _______________  Echo 02/15/19 IMPRESSIONS  1. There is an interatrial septal occluder device present without evidence of shunting.  2. Left ventricular ejection fraction, by visual estimation, is 55 to 60%. The left ventricle has normal function. There is no left ventricular hypertrophy.  3. Global right ventricle has normal systolic function.The right ventricular size is normal. No increase in right ventricular wall thickness.  4. Left atrial size was normal.  5. Right atrial size was normal.  6. Presence of pericardial fat pad.  7. The pericardial effusion is circumferential.  8. Trivial pericardial effusion is present.  9. Mild mitral annular calcification. 10. The mitral valve is degenerative. No evidence of mitral valve regurgitation. No evidence of mitral stenosis. 11. The tricuspid valve is grossly normal. Tricuspid valve regurgitation is not demonstrated. 12. Aortic valve regurgitation not assessed. 13. The aortic valve was not assessed. Aortic valve regurgitation not assessed. 14. The pulmonic valve was not assessed. Pulmonic valve regurgitation not assessed. 15. The aortic root was not well visualized.    ASSESSMENT & PLAN:   PFO s/p PFO closure: doing well. Groin site healing well. Continue aspirin and plavix x 3 months and then discontinue Plavix on 05/18/2019. SBE prophylaxis discussed; I have RX'd clindamycin due to a PCN allergy. She understands this can be discontinued after 6 months. I will see her back in 1year for follow up and echo with bubble study.   HTN: BP (diastolic> systolic ) is quite elevated today and has been since her ER visit 11/8. She is on Losartan 100mg  daily and Cardizem CD 240mg  daily (Rx'd for palpitations historically). Her HR is in the 70s, but she does have evidence of conduction diseased with a IRBBB and LAFB. Plan to stop Cardizem and start  Coreg 6.25mg  BID as well as amlodipine 5mg  daily. I would like her to keep a log of her BPs and be seen in the HTN clinic in 2 weeks for follow up.   Medication Adjustments/Labs and Tests Ordered: Current medicines are reviewed at length with the patient today.  Concerns regarding medicines are outlined above.  Medication changes, Labs and Tests ordered today are listed in the Patient Instructions below. Patient Instructions  Medication Instructions:  1) STOP DILTIAZEM 2) START COREG (carvedilol) 6.25 mg twice daily 3) START AMLODIPINE 5 mg daily 4) you may STOP PLAVIX May 18, 2019  Follow-Up: Your provider recommends that you schedule a follow-up appointment in 2 weeks with the HTN CLINIC.  You will be called to schedule your 1 year echo and office visit.    Signed, Angelena Form, PA-C  03/22/2019 2:11 PM    [facility] [place], [place], [place]  [address] Phone: [phone]; Fax: [phone]

## 2019-03-22 ENCOUNTER — Ambulatory Visit (INDEPENDENT_AMBULATORY_CARE_PROVIDER_SITE_OTHER): Payer: Medicare Other | Admitting: Physician Assistant

## 2019-03-22 ENCOUNTER — Encounter: Payer: Self-pay | Admitting: Physician Assistant

## 2019-03-22 ENCOUNTER — Other Ambulatory Visit: Payer: Self-pay

## 2019-03-22 VITALS — BP 150/108 | HR 81 | Ht 65.0 in | Wt 213.8 lb

## 2019-03-22 DIAGNOSIS — Q211 Atrial septal defect: Secondary | ICD-10-CM | POA: Diagnosis not present

## 2019-03-22 DIAGNOSIS — Q2112 Patent foramen ovale: Secondary | ICD-10-CM

## 2019-03-22 DIAGNOSIS — Z8774 Personal history of (corrected) congenital malformations of heart and circulatory system: Secondary | ICD-10-CM | POA: Diagnosis not present

## 2019-03-22 MED ORDER — CARVEDILOL 6.25 MG PO TABS: 6.2500 mg | ORAL_TABLET | Freq: Two times a day (BID) | ORAL | 11 refills | Status: DC

## 2019-03-22 MED ORDER — AMLODIPINE BESYLATE 5 MG PO TABS: 5.0000 mg | ORAL_TABLET | Freq: Every day | ORAL | 11 refills | Status: DC

## 2019-03-22 NOTE — Patient Instructions (Addendum)
Medication Instructions:  1) STOP DILTIAZEM 2) START COREG (carvedilol) 6.25 mg twice daily 3) START AMLODIPINE 5 mg daily 4) you may STOP PLAVIX May 18, 2019  Follow-Up: Your provider recommends that you schedule a follow-up appointment in 2 weeks with the HTN CLINIC.  You will be called to schedule your 1 year echo and office visit.

## 2019-03-23 ENCOUNTER — Other Ambulatory Visit: Payer: Self-pay | Admitting: Physician Assistant

## 2019-03-23 ENCOUNTER — Other Ambulatory Visit: Payer: Self-pay | Admitting: Cardiovascular Disease

## 2019-03-23 ENCOUNTER — Telehealth: Payer: Self-pay | Admitting: Physician Assistant

## 2019-03-23 MED ORDER — SPIRONOLACTONE 25 MG PO TABS: 12.5000 mg | ORAL_TABLET | Freq: Every day | ORAL | 3 refills | Status: DC

## 2019-03-23 MED ORDER — DILTIAZEM HCL ER COATED BEADS 240 MG PO CP24: 240.0000 mg | ORAL_CAPSULE | Freq: Every day | ORAL | Status: DC

## 2019-03-23 MED ORDER — POTASSIUM CHLORIDE ER 20 MEQ PO TBCR: 1.0000 | EXTENDED_RELEASE_TABLET | Freq: Every day | ORAL | 3 refills | Status: DC

## 2019-03-23 NOTE — Telephone Encounter (Signed)
  [facility]   MULTIDISCIPLINARY HEART VALVE TEAM  I saw her in clinic yesterday for PFO closure follow up. Her BP was noted to be elevated 150/108 mm Hg on Losartan 100mg  daily and Cardizem CD 240mg  daily. She said it had been running that high for weeks. Initially my plan was to start Coreg and amlodipine (stop Cardizem) but she is worried about a BB with her hx of asthma and also reluctant to start Norvasc with potential for LE edema.   After several MyChart messages back and forth we have decided to keep her on Losartan 100mg  daily and Cardizem CD 240mg  daily. She also has a history of hypokalemia on potassium supplementation Kdur 46meq daily. Last potassium in our system was mildly low at 3.3. New plan is to start spirolactone 12.5mg  daily. She will need a BMET at HTN clinic follow up on 12/17. If she continues to have issues with resistant hypertension after addition of diuretic, we might consider work up of secondary causes of hypertension (esp with unexplained hypokalemia).   Angelena Form PA-C  [facility]

## 2019-04-05 ENCOUNTER — Other Ambulatory Visit: Payer: Self-pay

## 2019-04-05 ENCOUNTER — Ambulatory Visit (INDEPENDENT_AMBULATORY_CARE_PROVIDER_SITE_OTHER): Payer: Medicare Other | Admitting: Pharmacist

## 2019-04-05 ENCOUNTER — Encounter: Payer: Self-pay | Admitting: Pharmacist

## 2019-04-05 VITALS — BP 136/98 | HR 76

## 2019-04-05 DIAGNOSIS — I10 Essential (primary) hypertension: Secondary | ICD-10-CM

## 2019-04-05 NOTE — Patient Instructions (Addendum)
It was a pleasure to meet you today!  Please keep a food diary of how much salt (sodium) you consume in a day. Your goal would be to stay under 2000 mg per day.  We will call you tomorrow with your lab results. If everything looks good, we will increase your spironolactone to 25mg  daily.  Please continue to check your blood pressure at home. Please bring your log and you blood pressure meter with you to your next appointment.  Please feel free to call us at [phone] if you have questions or concerns.

## 2019-04-05 NOTE — Progress Notes (Signed)
Patient ID: Glenda Evans                 DOB: [birth year]                      MRN: [id number]     HPI: Glenda Evans is a 63 y.o. female patient of Dr. Debara Pickett referred by [name], PA to HTN clinic. PMH is significant for anxiety, fibromyalgia, IBS, TIA and PFO s/p PFO closure. Patient saw [name] on 03/22/2019 where her blood pressure was 150/108. Per note from [place] "Initially my plan was to start Coreg and amlodipine (stop Cardizem) but she is worried about a BB with her hx of asthma and also reluctant to start Norvasc with potential for LE edema.   After several MyChart messages back and forth we have decided to keep her on Losartan 100mg  daily and Cardizem CD 240mg  daily. She also has a history of hypokalemia on potassium supplementation Kdur 11meq daily. Last potassium in our system was mildly low at 3.3. New plan is to start spirolactone 12.5mg  daily. She will need a BMET at HTN clinic follow up on 12/17. If she continues to have issues with resistant hypertension after addition of diuretic, we might consider work up of secondary causes of hypertension (esp with unexplained hypokalemia).   Patient presents today to BP clinic for initial appointment.She provides very good written records of her blood pressure since adding spironolactone. She denies blurred vision, headaches, swelling or shortness of breath. Patient biggest complaint is that anytime she exerts herself she starts dripping sweat, dizzy and feels faint. She never passes out. She says this has been happening for a while, even before her PFO closure. Happens when she does things like clean the house and fold laundry.  Current HTN meds: losartan 100mg  daily, cardizem 240mg  daily, spironolactone 12.5mg  daily Previously tried:  BP goal: <130/80  Family History: The patient's family history includes Breast cancer in her maternal grandmother; COPD in her father, maternal aunt, and maternal grandmother; Colon polyps in her  father, maternal aunt, maternal grandmother, mother, and sister; Diabetes in her maternal aunt, maternal grandmother, and sister; Heart disease in her maternal grandmother, mother, and sister; Hypertension in her father; Ovarian cancer in her paternal aunt.   Social History: The patient  reports that she has never smoked. She has never used smokeless tobacco. She reports that she does not drink alcohol or use drugs.   Diet: eats only once or twice a day - eats out most of time (vegetable plate) steak, mashed potatoes, wendy's grilled chicken sandwiches, fish, slaw, hushpupies, chicken quesedilla, chips/salsa, frosted minnie wheats, dr pepper 32 oz per day, 2% milk, sweet tea (rare)  Exercise: none because she becomes faint and overly sweaty with minimal exertions  Home BP readings: 157/93, 161/109, 164/98, 143/99, 131/103, 130/99, 137/94, 127/87, 144/92, 139/96, 125/93, 132/98 HR 78-102 (mostly 80's)  Wt Readings from Last 3 Encounters:  03/22/19 213 lb 12.8 oz (97 kg)  02/25/19 215 lb (97.5 kg)  02/15/19 215 lb (97.5 kg)   BP Readings from Last 3 Encounters:  03/22/19 (!) 150/108  02/26/19 (!) 140/96  02/15/19 133/86   Pulse Readings from Last 3 Encounters:  03/22/19 81  02/26/19 72  02/15/19 77    Renal function: CrCl cannot be calculated (Patient's most recent lab result is older than the maximum 21 days allowed.).  Past Medical History:  Diagnosis Date  . Allergy   . Anxiety   .  Arthritis   . Asthma   . Cataract    right eye   . Depression   . Fatigue   . Fibromyalgia   . GERD (gastroesophageal reflux disease)   . Heart murmur   . HLD (hyperlipidemia)   . Hyperglycemia   . Hypertension   . Hypothyroidism   . IBS (irritable bowel syndrome)   . Insomnia   . Migraine   . Mitral valve prolapse   . Osteopenia   . Pre-diabetes   . PVC (premature ventricular contraction)   . Stein-Leventhal syndrome   . Wears glasses     Current Outpatient Medications on File  Prior to Visit  Medication Sig Dispense Refill  . ACCU-CHEK GUIDE test strip USE AS DIRECTED ONCE A DAY    . albuterol (PROVENTIL HFA;VENTOLIN HFA) 108 (90 BASE) MCG/ACT inhaler Inhale 2 puffs into the lungs every 6 (six) hours as needed for wheezing or shortness of breath.     Marland Kitchen aspirin EC 81 MG tablet Take 81 mg by mouth daily.    Marland Kitchen atorvastatin (LIPITOR) 40 MG tablet Take 40 mg by mouth daily.    . Black Cohosh 40 MG CAPS Take 40 mg by mouth daily.     . clindamycin (CLEOCIN) 300 MG capsule Take 2 capsules (600 mg total) by mouth as directed. Take 2 capsules 1 hour prior to dental work, including routine cleaning. 8 capsule 0  . clonazePAM (KLONOPIN) 0.5 MG tablet Take 0.5 mg by mouth at bedtime. take a 0.5 mg dose during the day as needed for anxiety  0  . clopidogrel (PLAVIX) 75 MG tablet Take 1 tablet (75 mg total) by mouth daily. 90 tablet 0  . diltiazem (CARDIZEM CD) 240 MG 24 hr capsule Take 1 capsule (240 mg total) by mouth daily.    . DULoxetine (CYMBALTA) 60 MG capsule Take 120 mg by mouth daily.   3  . levothyroxine (SYNTHROID, LEVOTHROID) 125 MCG tablet Take 125 mcg by mouth daily before breakfast.    . loratadine (CLARITIN) 10 MG tablet Take 10 mg by mouth daily as needed for allergies.     Marland Kitchen losartan (COZAAR) 25 MG tablet Take 4 tablets (100 mg total) by mouth daily. 360 tablet 3  . metFORMIN (GLUCOPHAGE-XR) 500 MG 24 hr tablet Take 500 mg by mouth daily.     . Multiple Vitamin (MULTIVITAMIN WITH MINERALS) TABS tablet Take 1 tablet by mouth daily. Vita Fusion    . naproxen sodium (ALEVE) 220 MG tablet Take 440 mg by mouth daily as needed (migraines).    . Nutritional Supplements (ESTROVEN PO) Take 1 tablet by mouth daily.     . pantoprazole (PROTONIX) 40 MG tablet Take 1 tablet (40 mg total) by mouth daily. 90 tablet 3  . Potassium Chloride ER 20 MEQ TBCR Take 1 tablet by mouth daily. 90 tablet 3  . spironolactone (ALDACTONE) 25 MG tablet Take 0.5 tablets (12.5 mg total) by mouth  daily. 45 tablet 3  . Vitamin D, Ergocalciferol, (DRISDOL) 50000 UNITS CAPS capsule Take 50,000 Units by mouth every Sunday.      Current Facility-Administered Medications on File Prior to Visit  Medication Dose Route Frequency Provider Last Rate Last Admin  . triamcinolone acetonide (KENALOG) 10 MG/ML injection 10 mg  10 mg Other Once Landis Martins, DPM        Allergies  Allergen Reactions  . Augmentin [Amoxicillin-Pot Clavulanate] Shortness Of Breath and Swelling    Did it involve swelling of the face/tongue/throat,  SOB, or low BP? Yes Did it involve sudden or severe rash/hives, skin peeling, or any reaction on the inside of your mouth or nose? No Did you need to seek medical attention at a hospital or doctor's office? No When did it last happen?10 years ago If all above answers are "NO", may proceed with cephalosporin use.   . Erythromycin Shortness Of Breath and Swelling  . Lyrica [Pregabalin] Shortness Of Breath and Swelling  . Macrobid [Nitrofurantoin Macrocrystal] Swelling  . Metaxalone Shortness Of Breath  . Neurontin [Gabapentin] Shortness Of Breath and Swelling  . Ofloxacin Shortness Of Breath and Swelling  . Pamelor Shortness Of Breath and Swelling  . Relafen [Nabumetone] Shortness Of Breath and Swelling  . Septra [Bactrim] Shortness Of Breath and Swelling  . Sulfasalazine Shortness Of Breath  . Escitalopram Oxalate Other (See Comments)    Unsure of exact reaction type    There were no vitals taken for this visit.   Assessment/Plan:  1. Hypertension - Blood pressure improved, but still above goal of <130/80. Plan to increase spironolactone to 25mg  daily as long as labs stable. Discussed with patient diet and importance of cutting out soda intake mainly due to sugar intake, but also due to sodium intake. Encouraged patient to keep track of her sodium intake for 1-2 days. Her goal should be 2000mg  per day. Patient eats out very often and I suspect she consumes way  over 2000mg  per day. Advised her to pay attention to severing sizes while calculating intake and to look of restaurant web sites for nutrition information. Will follow up with patient in 2 -3 weeks in clinic. I have asked patient when she gets her hot/dizzy/faint feeling to check both her blood pressure and blood sugar to make sure they are not low. I will also send to Dr. Debara Pickett to make sure he does not want to do a workup or for other suggestions.   Thank you  Ramond Dial, Pharm.D, BCPS, CPP [place]  [id number], [place], [place] [address]  Phone: [phone]; Fax: [phone]

## 2019-04-05 NOTE — Progress Notes (Signed)
Thanks .. I agree with checking those things. She may have vasomotor instability or hyperhidrosis, which are other causes of those symptoms  Dr Lemmie Evens

## 2019-04-06 ENCOUNTER — Telehealth: Payer: Self-pay | Admitting: Pharmacist

## 2019-04-06 LAB — BASIC METABOLIC PANEL
BUN/Creatinine Ratio: 17 (ref 12–28)
BUN: 12 mg/dL (ref 8–27)
CO2: 24 mmol/L (ref 20–29)
Calcium: 9.7 mg/dL (ref 8.7–10.3)
Chloride: 102 mmol/L (ref 96–106)
Creatinine, Ser: 0.71 mg/dL (ref 0.57–1.00)
GFR calc Af Amer: 107 mL/min/{1.73_m2} (ref >59)
GFR calc non Af Amer: 93 mL/min/{1.73_m2} (ref >59)
Glucose: 104 mg/dL — ABNORMAL HIGH (ref 65–99)
Potassium: 4.3 mmol/L (ref 3.5–5.2)
Sodium: 142 mmol/L (ref 134–144)

## 2019-04-06 MED ORDER — SPIRONOLACTONE 25 MG PO TABS: 25.0000 mg | ORAL_TABLET | Freq: Every day | ORAL | 3 refills | Status: DC

## 2019-04-06 NOTE — Telephone Encounter (Signed)
BMET stable, called pt and advised her to increase her spironolactone to 25mg  daily, refill sent in per pt request. Will recheck BMET again at next HTN visit, hopefully can stop K supplementation at that time.

## 2019-04-23 ENCOUNTER — Ambulatory Visit: Payer: Medicare Other

## 2019-04-23 NOTE — Progress Notes (Deleted)
Patient ID: Glenda Evans                 DOB: May 26, 1955                      MRN: [id number]     HPI: [name] is a 64 y.o. female patient of Dr. Debara Pickett referred by [name], PA to HTN clinic. PMH is significant for anxiety, fibromyalgia, IBS, TIA and PFO s/p PFO closure. Patient saw [name] on 03/22/2019 where her blood pressure was 150/108. Per note from [place] "Initially my plan was to start Coreg and amlodipine (stop Cardizem) but she is worried about a BB with her hx of asthma and also reluctant to start Norvasc with potential for LE edema.   After several MyChart messages back and forth we have decided to keep her on Losartan 100mg  daily and Cardizem CD 240mg  daily. She also has a history of hypokalemia on potassium supplementation Kdur 62meq daily. Last potassium in our system was mildly low at 3.3. New plan is to start spirolactone 12.5mg  daily. She will need a BMET at HTN clinic follow up on 12/17. If she continues to have issues with resistant hypertension after addition of diuretic, we might consider work up of secondary causes of hypertension (esp with unexplained hypokalemia).   At last HTN visit, spironolactone was increased to 25mg  daily. She was asked to decrease soda intake and to log her sodium intake for 1-2 days. BP/BG when she gets  hot/dizzy/faint feeling episodes? Dizziness, lightheadedness, headache, blurred vision, SOB, swelling  vasomotor instability or hyperhidrosis  Current HTN meds: losartan 100mg  daily, cardizem 240mg  daily, spironolactone 25mg  daily Previously tried:  BP goal: <130/80  Family History: The patient's family history includes Breast cancer in her maternal grandmother; COPD in her father, maternal aunt, and maternal grandmother; Colon polyps in her father, maternal aunt, maternal grandmother, mother, and sister; Diabetes in her maternal aunt, maternal grandmother, and sister; Heart disease in her maternal grandmother, mother, and sister;  Hypertension in her father; Ovarian cancer in her paternal aunt.   Social History: The patient  reports that she has never smoked. She has never used smokeless tobacco. She reports that she does not drink alcohol or use drugs.   Diet: eats only once or twice a day - eats out most of time (vegetable plate) steak, mashed potatoes, wendy's grilled chicken sandwiches, fish, slaw, hushpupies, chicken quesedilla, chips/salsa, frosted minnie wheats, dr pepper 32 oz per day, 2% milk, sweet tea (rare)  Exercise: none because she becomes faint and overly sweaty with minimal exertions  Home BP readings: 157/93, 161/109, 164/98, 143/99, 131/103, 130/99, 137/94, 127/87, 144/92, 139/96, 125/93, 132/98 HR 78-102 (mostly 80's)  Wt Readings from Last 3 Encounters:  03/22/19 213 lb 12.8 oz (97 kg)  02/25/19 215 lb (97.5 kg)  02/15/19 215 lb (97.5 kg)   BP Readings from Last 3 Encounters:  04/05/19 (!) 136/98  03/22/19 (!) 150/108  02/26/19 (!) 140/96   Pulse Readings from Last 3 Encounters:  04/05/19 76  03/22/19 81  02/26/19 72    Renal function: CrCl cannot be calculated (Unknown ideal weight.).  Past Medical History:  Diagnosis Date  . Allergy   . Anxiety   . Arthritis   . Asthma   . Cataract    right eye   . Depression   . Fatigue   . Fibromyalgia   . GERD (gastroesophageal reflux disease)   . Heart murmur   .  HLD (hyperlipidemia)   . Hyperglycemia   . Hypertension   . Hypothyroidism   . IBS (irritable bowel syndrome)   . Insomnia   . Migraine   . Mitral valve prolapse   . Osteopenia   . Pre-diabetes   . PVC (premature ventricular contraction)   . Stein-Leventhal syndrome   . Wears glasses     Current Outpatient Medications on File Prior to Visit  Medication Sig Dispense Refill  . ACCU-CHEK GUIDE test strip USE AS DIRECTED ONCE A DAY    . albuterol (PROVENTIL HFA;VENTOLIN HFA) 108 (90 BASE) MCG/ACT inhaler Inhale 2 puffs into the lungs every 6 (six) hours as needed for  wheezing or shortness of breath.     Marland Kitchen aspirin EC 81 MG tablet Take 81 mg by mouth daily.    Marland Kitchen atorvastatin (LIPITOR) 40 MG tablet Take 40 mg by mouth daily.    . Black Cohosh 40 MG CAPS Take 40 mg by mouth daily.     . clindamycin (CLEOCIN) 300 MG capsule Take 2 capsules (600 mg total) by mouth as directed. Take 2 capsules 1 hour prior to dental work, including routine cleaning. 8 capsule 0  . clonazePAM (KLONOPIN) 0.5 MG tablet Take 0.5 mg by mouth at bedtime. take a 0.5 mg dose during the day as needed for anxiety  0  . clopidogrel (PLAVIX) 75 MG tablet Take 1 tablet (75 mg total) by mouth daily. 90 tablet 0  . diltiazem (CARDIZEM CD) 240 MG 24 hr capsule Take 1 capsule (240 mg total) by mouth daily.    . DULoxetine (CYMBALTA) 60 MG capsule Take 120 mg by mouth daily.   3  . levothyroxine (SYNTHROID, LEVOTHROID) 125 MCG tablet Take 125 mcg by mouth daily before breakfast.    . loratadine (CLARITIN) 10 MG tablet Take 10 mg by mouth daily as needed for allergies.     Marland Kitchen losartan (COZAAR) 25 MG tablet Take 4 tablets (100 mg total) by mouth daily. 360 tablet 3  . metFORMIN (GLUCOPHAGE-XR) 500 MG 24 hr tablet Take 500 mg by mouth daily.     . Multiple Vitamin (MULTIVITAMIN WITH MINERALS) TABS tablet Take 1 tablet by mouth daily. Vita Fusion    . naproxen sodium (ALEVE) 220 MG tablet Take 440 mg by mouth daily as needed (migraines).    . Nutritional Supplements (ESTROVEN PO) Take 1 tablet by mouth daily.     . pantoprazole (PROTONIX) 40 MG tablet Take 1 tablet (40 mg total) by mouth daily. 90 tablet 3  . Potassium Chloride ER 20 MEQ TBCR Take 1 tablet by mouth daily. 90 tablet 3  . spironolactone (ALDACTONE) 25 MG tablet Take 1 tablet (25 mg total) by mouth daily. 90 tablet 3  . Vitamin D, Ergocalciferol, (DRISDOL) 50000 UNITS CAPS capsule Take 50,000 Units by mouth every Sunday.      Current Facility-Administered Medications on File Prior to Visit  Medication Dose Route Frequency Provider Last  Rate Last Admin  . triamcinolone acetonide (KENALOG) 10 MG/ML injection 10 mg  10 mg Other Once Landis Martins, DPM        Allergies  Allergen Reactions  . Augmentin [Amoxicillin-Pot Clavulanate] Shortness Of Breath and Swelling    Did it involve swelling of the face/tongue/throat, SOB, or low BP? Yes Did it involve sudden or severe rash/hives, skin peeling, or any reaction on the inside of your mouth or nose? No Did you need to seek medical attention at a hospital or doctor's office? No When  did it last happen?10 years ago If all above answers are "NO", may proceed with cephalosporin use.   . Erythromycin Shortness Of Breath and Swelling  . Lyrica [Pregabalin] Shortness Of Breath and Swelling  . Macrobid [Nitrofurantoin Macrocrystal] Swelling  . Metaxalone Shortness Of Breath  . Neurontin [Gabapentin] Shortness Of Breath and Swelling  . Ofloxacin Shortness Of Breath and Swelling  . Pamelor Shortness Of Breath and Swelling  . Relafen [Nabumetone] Shortness Of Breath and Swelling  . Septra [Bactrim] Shortness Of Breath and Swelling  . Sulfasalazine Shortness Of Breath  . Escitalopram Oxalate Other (See Comments)    Unsure of exact reaction type    There were no vitals taken for this visit.   Assessment/Plan:  1. Hypertension -    Thank you  Ramond Dial, Pharm.D, BCPS, CPP [place]  [id number] [place], [place] [address]  Phone: [phone]; Fax: [phone]

## 2019-04-24 ENCOUNTER — Encounter: Payer: Self-pay | Admitting: Pharmacist

## 2019-04-24 ENCOUNTER — Other Ambulatory Visit: Payer: Self-pay

## 2019-04-24 ENCOUNTER — Ambulatory Visit (INDEPENDENT_AMBULATORY_CARE_PROVIDER_SITE_OTHER): Payer: Medicare Other | Admitting: Pharmacist

## 2019-04-24 VITALS — BP 118/86 | HR 108

## 2019-04-24 DIAGNOSIS — I10 Essential (primary) hypertension: Secondary | ICD-10-CM | POA: Diagnosis not present

## 2019-04-24 MED ORDER — CARVEDILOL 3.125 MG PO TABS: 3.1250 mg | ORAL_TABLET | Freq: Two times a day (BID) | ORAL | 11 refills | Status: DC

## 2019-04-24 NOTE — Patient Instructions (Addendum)
It was a pleasure to see you again  Please start taking carvedilol 3.125mg  twice a day.  Continue losartan 100mg  daily, cardizem 240mg  daily, spironolactone 25mg  daily

## 2019-04-24 NOTE — Progress Notes (Signed)
Patient ID: Glenda Evans                 DOB: 13-Jul-1955                      MRN: [id number]     HPI: Glenda Evans is a 64 y.o. female patient of Dr. Debara Pickett referred by [name], PA to HTN clinic. PMH is significant for anxiety, fibromyalgia, IBS, TIA and PFO s/p PFO closure. Patient saw [name] on 03/22/2019 where her blood pressure was 150/108. Per note from [place] "Initially my plan was to start Coreg and amlodipine (stop Cardizem) but she is worried about a BB with her hx of asthma and also reluctant to start Norvasc with potential for LE edema.   After several MyChart messages back and forth we have decided to keep her on Losartan 100mg  daily and Cardizem CD 240mg  daily. She also has a history of hypokalemia on potassium supplementation Kdur 14meq daily. Last potassium in our system was mildly low at 3.3. New plan is to start spirolactone 12.5mg  daily. She will need a BMET at HTN clinic follow up on 12/17. If she continues to have issues with resistant hypertension after addition of diuretic, we might consider work up of secondary causes of hypertension (esp with unexplained hypokalemia).   At last HTN visit, spironolactone was increased to 25mg  daily. She was asked to decrease soda intake, to log her sodium intake for 1-2 days and to check her blood pressure and blood sugar when she feels hot, sweaty or dizzy.  Patient presents to clinic today for follow up. Her blood pressure has improved with the increase in spironolactone. She has been checking her blood pressure when she starts to feel hot/dizzy/faint and it reveals significant tachycardia. Blood pressure nor her blood sugar is low. Patient states she has a history of palpitations, but they have been well controlled. She mostly gets this hot, dizzy, faint feeling with minimal exertion. However it does occur sometimes just while sitting. It prevents her from achomplishing much of anything.   She did track her sodium and has been  trying hard to keep it down. She has cut back on soda intake. Her last few Na intake readings were 1205, 1295, 2098.  Patient blood pressure cuff was compared to manual cuff. Her monitor read 123/86, ours read 118/86. Her cuff appears about accurate.   Current HTN meds: losartan 100mg  daily, cardizem 240mg  daily, spironolactone 25mg  daily Previously tried: irbesartan, valsartan BP goal: <130/80  Family History: The patient's family history includes Breast cancer in her maternal grandmother; COPD in her father, maternal aunt, and maternal grandmother; Colon polyps in her father, maternal aunt, maternal grandmother, mother, and sister; Diabetes in her maternal aunt, maternal grandmother, and sister; Heart disease in her maternal grandmother, mother, and sister; Hypertension in her father; Ovarian cancer in her paternal aunt.   Social History: The patient  reports that she has never smoked. She has never used smokeless tobacco. She reports that she does not drink alcohol or use drugs.   Diet: eats only once or twice a day - eats out most of time (vegetable plate) steak, mashed potatoes, [employer] grilled chicken sandwiches, fish, slaw, hushpupies, chicken quesedilla, chips/salsa, frosted minnie wheats, dr pepper 32 oz per day, 2% milk, sweet tea (rare)  Exercise: none because she becomes faint and overly sweaty with minimal exertions  Home BP readings: 138/91, 133/95 HR 117 (sweatty, hot dizzy), 119/015 HR 102, 132/85 HR  81, 154/92 HR 96, 143/103 HR 119, 115/101 HR 77, 104/84 HR 99, 136/85 HR 117 (hot), 102/88 HR 101 (light headed), 130/93 HR 127 (hot sweaty), 128/119 HR 111 131/101 HR 103  Wt Readings from Last 3 Encounters:  03/22/19 213 lb 12.8 oz (97 kg)  02/25/19 215 lb (97.5 kg)  02/15/19 215 lb (97.5 kg)   BP Readings from Last 3 Encounters:  04/05/19 (!) 136/98  03/22/19 (!) 150/108  02/26/19 (!) 140/96   Pulse Readings from Last 3 Encounters:  04/05/19 76  03/22/19 81  02/26/19 72     Renal function: CrCl cannot be calculated (Unknown ideal weight.).  Past Medical History:  Diagnosis Date  . Allergy   . Anxiety   . Arthritis   . Asthma   . Cataract    right eye   . Depression   . Fatigue   . Fibromyalgia   . GERD (gastroesophageal reflux disease)   . Heart murmur   . HLD (hyperlipidemia)   . Hyperglycemia   . Hypertension   . Hypothyroidism   . IBS (irritable bowel syndrome)   . Insomnia   . Migraine   . Mitral valve prolapse   . Osteopenia   . Pre-diabetes   . PVC (premature ventricular contraction)   . Stein-Leventhal syndrome   . Wears glasses     Current Outpatient Medications on File Prior to Visit  Medication Sig Dispense Refill  . ACCU-CHEK GUIDE test strip USE AS DIRECTED ONCE A DAY    . albuterol (PROVENTIL HFA;VENTOLIN HFA) 108 (90 BASE) MCG/ACT inhaler Inhale 2 puffs into the lungs every 6 (six) hours as needed for wheezing or shortness of breath.     Marland Kitchen aspirin EC 81 MG tablet Take 81 mg by mouth daily.    Marland Kitchen atorvastatin (LIPITOR) 40 MG tablet Take 40 mg by mouth daily.    . Black Cohosh 40 MG CAPS Take 40 mg by mouth daily.     . clindamycin (CLEOCIN) 300 MG capsule Take 2 capsules (600 mg total) by mouth as directed. Take 2 capsules 1 hour prior to dental work, including routine cleaning. 8 capsule 0  . clonazePAM (KLONOPIN) 0.5 MG tablet Take 0.5 mg by mouth at bedtime. take a 0.5 mg dose during the day as needed for anxiety  0  . clopidogrel (PLAVIX) 75 MG tablet Take 1 tablet (75 mg total) by mouth daily. 90 tablet 0  . diltiazem (CARDIZEM CD) 240 MG 24 hr capsule Take 1 capsule (240 mg total) by mouth daily.    . DULoxetine (CYMBALTA) 60 MG capsule Take 120 mg by mouth daily.   3  . levothyroxine (SYNTHROID, LEVOTHROID) 125 MCG tablet Take 125 mcg by mouth daily before breakfast.    . loratadine (CLARITIN) 10 MG tablet Take 10 mg by mouth daily as needed for allergies.     Marland Kitchen losartan (COZAAR) 25 MG tablet Take 4 tablets (100 mg  total) by mouth daily. 360 tablet 3  . metFORMIN (GLUCOPHAGE-XR) 500 MG 24 hr tablet Take 500 mg by mouth daily.     . Multiple Vitamin (MULTIVITAMIN WITH MINERALS) TABS tablet Take 1 tablet by mouth daily. Vita Fusion    . naproxen sodium (ALEVE) 220 MG tablet Take 440 mg by mouth daily as needed (migraines).    . Nutritional Supplements (ESTROVEN PO) Take 1 tablet by mouth daily.     . pantoprazole (PROTONIX) 40 MG tablet Take 1 tablet (40 mg total) by mouth daily. 90 tablet  3  . Potassium Chloride ER 20 MEQ TBCR Take 1 tablet by mouth daily. 90 tablet 3  . spironolactone (ALDACTONE) 25 MG tablet Take 1 tablet (25 mg total) by mouth daily. 90 tablet 3  . Vitamin D, Ergocalciferol, (DRISDOL) 50000 UNITS CAPS capsule Take 50,000 Units by mouth every Sunday.      Current Facility-Administered Medications on File Prior to Visit  Medication Dose Route Frequency Provider Last Rate Last Admin  . triamcinolone acetonide (KENALOG) 10 MG/ML injection 10 mg  10 mg Other Once Landis Martins, DPM        Allergies  Allergen Reactions  . Augmentin [Amoxicillin-Pot Clavulanate] Shortness Of Breath and Swelling    Did it involve swelling of the face/tongue/throat, SOB, or low BP? Yes Did it involve sudden or severe rash/hives, skin peeling, or any reaction on the inside of your mouth or nose? No Did you need to seek medical attention at a hospital or doctor's office? No When did it last happen?10 years ago If all above answers are "NO", may proceed with cephalosporin use.   . Erythromycin Shortness Of Breath and Swelling  . Lyrica [Pregabalin] Shortness Of Breath and Swelling  . Macrobid [Nitrofurantoin Macrocrystal] Swelling  . Metaxalone Shortness Of Breath  . Neurontin [Gabapentin] Shortness Of Breath and Swelling  . Ofloxacin Shortness Of Breath and Swelling  . Pamelor Shortness Of Breath and Swelling  . Relafen [Nabumetone] Shortness Of Breath and Swelling  . Septra [Bactrim] Shortness Of  Breath and Swelling  . Sulfasalazine Shortness Of Breath  . Escitalopram Oxalate Other (See Comments)    Unsure of exact reaction type    There were no vitals taken for this visit.   Assessment/Plan:  1. Hypertension - Blood pressure is slightly above goal of <130/80 in clinic today. However she is tachycardic. Discussed with patient her symptoms of sweaty, dizzy, lightheaded and her tachycardia. These symptoms could be vasomotor instability. Patient had a hysterectomy in the 30's and has gone through mesopause twice per her account. She use to take estrogen, but decided it was not worth the risk of stroke or breast cancer. I suggested that it might be a good idea to wear an event monitor to rule out any arrhythmia, but I would discuss this with Dr. Debara Pickett. Discussed using clonidine to help with hot flashes (some evidence this can be useful) but after reviewing side effects patient did not wish to try. Will start carvedilol 3.125mg  twice a day to help with tachycardia. Continue losartan 100mg  daily, cardizem 240mg  daily and spironolactone 25mg  daily. Follow up in clinic in 3 weeks. Will check a BMP today since spironolactone was increase last visit.   Thank you  Ramond Dial, Pharm.D, BCPS, CPP [place]  [id number], [place], [place] [address]  Phone: [phone]; Fax: [phone]

## 2019-04-25 LAB — BASIC METABOLIC PANEL
BUN/Creatinine Ratio: 23 (ref 12–28)
BUN: 19 mg/dL (ref 8–27)
CO2: 22 mmol/L (ref 20–29)
Calcium: 9.8 mg/dL (ref 8.7–10.3)
Chloride: 103 mmol/L (ref 96–106)
Creatinine, Ser: 0.84 mg/dL (ref 0.57–1.00)
GFR calc Af Amer: 87 mL/min/{1.73_m2} (ref >59)
GFR calc non Af Amer: 75 mL/min/{1.73_m2} (ref >59)
Glucose: 256 mg/dL — ABNORMAL HIGH (ref 65–99)
Potassium: 4.2 mmol/L (ref 3.5–5.2)
Sodium: 142 mmol/L (ref 134–144)

## 2019-05-12 DIAGNOSIS — J019 Acute sinusitis, unspecified: Secondary | ICD-10-CM | POA: Diagnosis not present

## 2019-05-14 ENCOUNTER — Other Ambulatory Visit: Payer: Self-pay | Admitting: Cardiovascular Disease

## 2019-05-14 NOTE — Progress Notes (Unsigned)
Patient ID: Glenda Evans                 DOB: Dec 18, 1955                      MRN: [id number]     HPI: [name] is a 64 y.o. female patient of Dr. Debara Pickett referred by [name], PA to HTN clinic. PMH is significant for anxiety, fibromyalgia, IBS,TIA and PFO s/p PFO closure.  At last clinic visit on 04/24/19, the patient reported getting this hot, dizzy, faint feeling with minimal exertion. She has been trying to cut back on her sodium and soda intake. Blood pressure is slightly above goal of <130/80 in clinic, however she was tachycardic. Discussed with the patient her symptoms could be vasomotor instability. Patient had a hysterectomy in the 30's and has gone through mesopause twice per her account. She used to take estrogen, but decided it was not worth the risk of stroke or breast cancer. Discussed using clonidine to help with hot flashes (some evidence this can be useful) but after reviewing side effects patient did not wish to try.The patient started taking carvedilol 3.125mg  twice a day to help with tachycardia. BMP on 04/24/19 stable except glucose elevated at 256 mg/dL.  Patient arrives at HTN clinic for follow-up. Symptoms? BP? HR? Increase carvedilol 6.25 or diltiazem 360  Current HTN meds: losartan 100mg  daily, Cardizem 240mg  daily, spironolactone 25mg  daily, carvedilol 3.125mg  twice daily Previously tried: irbesartan, valsartan BP goal: <130/80  Family History: The patient's family history includes Breast cancer in her maternal grandmother; COPD in her father, maternal aunt, and maternal grandmother; Colon polyps in her father, maternal aunt, maternal grandmother, mother, and sister; Diabetes in her maternal aunt, maternal grandmother, and sister; Heart disease in her maternal grandmother, mother, and sister; Hypertension in her father; Ovarian cancer in her paternal aunt.  Social History: The patientreports that she has never smoked. She has never used smokeless  tobacco. She reports that she does not drink alcohol or use drugs.  Diet: eats only once or twice a day - eats out most of time (vegetable plate) steak, mashed potatoes, wendy[employer] grilled chicken sandwiches, fish, slaw, hushpupies, chicken quesedilla, chips/salsa, frosted minnie wheats, dr pepper 32 oz per day, 2% milk, sweet tea (rare)  Exercise: none because she becomes faint and overly sweaty with minimal exertions  Home BP readings:   Wt Readings from Last 3 Encounters:  03/22/19 213 lb 12.8 oz (97 kg)  02/25/19 215 lb (97.5 kg)  02/15/19 215 lb (97.5 kg)   BP Readings from Last 3 Encounters:  04/24/19 118/86  04/05/19 (!) 136/98  03/22/19 (!) 150/108   Pulse Readings from Last 3 Encounters:  04/24/19 (!) 108  04/05/19 76  03/22/19 81    Renal function: CrCl cannot be calculated (Unknown ideal weight.).  Past Medical History:  Diagnosis Date  . Allergy   . Anxiety   . Arthritis   . Asthma   . Cataract    right eye   . Depression   . Fatigue   . Fibromyalgia   . GERD (gastroesophageal reflux disease)   . Heart murmur   . HLD (hyperlipidemia)   . Hyperglycemia   . Hypertension   . Hypothyroidism   . IBS (irritable bowel syndrome)   . Insomnia   . Migraine   . Mitral valve prolapse   . Osteopenia   . Pre-diabetes   . PVC (premature ventricular contraction)   .  Stein-Leventhal syndrome   . Wears glasses     Current Outpatient Medications on File Prior to Visit  Medication Sig Dispense Refill  . ACCU-CHEK GUIDE test strip USE AS DIRECTED ONCE A DAY    . albuterol (PROVENTIL HFA;VENTOLIN HFA) 108 (90 BASE) MCG/ACT inhaler Inhale 2 puffs into the lungs every 6 (six) hours as needed for wheezing or shortness of breath.     Marland Kitchen aspirin EC 81 MG tablet Take 81 mg by mouth daily.    Marland Kitchen atorvastatin (LIPITOR) 40 MG tablet Take 40 mg by mouth daily.    . Black Cohosh 40 MG CAPS Take 40 mg by mouth daily.     . carvedilol (COREG) 3.125 MG tablet Take 1 tablet (3.125  mg total) by mouth 2 (two) times daily with a meal. 60 tablet 11  . clindamycin (CLEOCIN) 300 MG capsule Take 2 capsules (600 mg total) by mouth as directed. Take 2 capsules 1 hour prior to dental work, including routine cleaning. 8 capsule 0  . clonazePAM (KLONOPIN) 0.5 MG tablet Take 0.5 mg by mouth at bedtime. take a 0.5 mg dose during the day as needed for anxiety  0  . diltiazem (CARDIZEM CD) 240 MG 24 hr capsule Take 1 capsule (240 mg total) by mouth daily.    . DULoxetine (CYMBALTA) 60 MG capsule Take 120 mg by mouth daily.   3  . levothyroxine (SYNTHROID, LEVOTHROID) 125 MCG tablet Take 125 mcg by mouth daily before breakfast.    . loratadine (CLARITIN) 10 MG tablet Take 10 mg by mouth daily as needed for allergies.     Marland Kitchen losartan (COZAAR) 25 MG tablet Take 4 tablets (100 mg total) by mouth daily. 360 tablet 3  . metFORMIN (GLUCOPHAGE-XR) 500 MG 24 hr tablet Take 500 mg by mouth daily.     . Multiple Vitamin (MULTIVITAMIN WITH MINERALS) TABS tablet Take 1 tablet by mouth daily. Vita Fusion    . naproxen sodium (ALEVE) 220 MG tablet Take 440 mg by mouth daily as needed (migraines).    . Nutritional Supplements (ESTROVEN PO) Take 1 tablet by mouth daily.     . pantoprazole (PROTONIX) 40 MG tablet Take 1 tablet (40 mg total) by mouth daily. 90 tablet 3  . Potassium Chloride ER 20 MEQ TBCR Take 1 tablet by mouth daily. 90 tablet 3  . Rhubarb (ESTROVEN MENOPAUSE RELIEF PO) Take 1 tablet by mouth daily.    Marland Kitchen spironolactone (ALDACTONE) 25 MG tablet Take 1 tablet (25 mg total) by mouth daily. 90 tablet 3  . Vitamin D, Ergocalciferol, (DRISDOL) 50000 UNITS CAPS capsule Take 50,000 Units by mouth every Sunday.      Current Facility-Administered Medications on File Prior to Visit  Medication Dose Route Frequency Provider Last Rate Last Admin  . triamcinolone acetonide (KENALOG) 10 MG/ML injection 10 mg  10 mg Other Once Landis Martins, DPM        Allergies  Allergen Reactions  . Augmentin  [Amoxicillin-Pot Clavulanate] Shortness Of Breath and Swelling    Did it involve swelling of the face/tongue/throat, SOB, or low BP? Yes Did it involve sudden or severe rash/hives, skin peeling, or any reaction on the inside of your mouth or nose? No Did you need to seek medical attention at a hospital or doctor's office? No When did it last happen?10 years ago If all above answers are "NO", may proceed with cephalosporin use.   . Erythromycin Shortness Of Breath and Swelling  . Lyrica [Pregabalin] Shortness Of  Breath and Swelling  . Macrobid [Nitrofurantoin Macrocrystal] Swelling  . Metaxalone Shortness Of Breath  . Neurontin [Gabapentin] Shortness Of Breath and Swelling  . Ofloxacin Shortness Of Breath and Swelling  . Pamelor Shortness Of Breath and Swelling  . Relafen [Nabumetone] Shortness Of Breath and Swelling  . Septra [Bactrim] Shortness Of Breath and Swelling  . Sulfasalazine Shortness Of Breath  . Escitalopram Oxalate Other (See Comments)    Unsure of exact reaction type     Assessment/Plan:  1. Hypertension -

## 2019-05-15 ENCOUNTER — Ambulatory Visit: Payer: Medicare Other

## 2019-05-18 ENCOUNTER — Other Ambulatory Visit: Payer: Self-pay | Admitting: Internal Medicine

## 2019-05-18 ENCOUNTER — Telehealth: Payer: Self-pay | Admitting: Internal Medicine

## 2019-05-18 NOTE — Telephone Encounter (Signed)
Returned call to patient.She was calling to verify if she is suppose to take Diltiazem.Advised she needs to continue Diltiazem 240 mg daily.Stated she was not able to take Carvedilol 3.125 mg prescribed.by pharmacist.Stated caused tightness in throat.She will continue to monitor B/P and keep appointment with pharmacy 05/24/19 at 10:00 am.

## 2019-05-18 NOTE — Telephone Encounter (Signed)
Patient is calling to inquire about if she should continue to take diltiazem (CARDIZEM CD) 240 MG 24 hr capsule  medication. Please call and advise.

## 2019-05-24 ENCOUNTER — Other Ambulatory Visit: Payer: Self-pay

## 2019-05-24 ENCOUNTER — Encounter: Payer: Self-pay | Admitting: Pharmacist

## 2019-05-24 ENCOUNTER — Ambulatory Visit (INDEPENDENT_AMBULATORY_CARE_PROVIDER_SITE_OTHER): Payer: Medicare Other | Admitting: Pharmacist

## 2019-05-24 VITALS — BP 120/86 | HR 72

## 2019-05-24 DIAGNOSIS — I10 Essential (primary) hypertension: Secondary | ICD-10-CM

## 2019-05-24 MED ORDER — DILTIAZEM HCL ER COATED BEADS 360 MG PO CP24: 360.0000 mg | ORAL_CAPSULE | Freq: Every day | ORAL | 3 refills | Status: DC

## 2019-05-24 NOTE — Patient Instructions (Signed)
It was a pleasure to see you again!  Please increase your diltiazem to 360mg  daily. I have sent in a new prescription.  I will call you in 2 weeks to see how everything is going.  Please call us at [phone] with any questions or concerns.

## 2019-05-24 NOTE — Progress Notes (Signed)
Patient ID: DRAVEN MERANTE                 DOB: Dec 30, 1955                      MRN: [id number]     HPI: Glenda Evans is a 64 y.o. female patient of Dr. Debara Pickett referred by [name], PA to HTN clinic. PMH is significant for anxiety, fibromyalgia, IBS, TIA and PFO s/p PFO closure. Patient saw [name] on 03/22/2019 where her blood pressure was 150/108. Per note from [place] "Initially my plan was to start Coreg and amlodipine (stop Cardizem) but she is worried about a BB with her hx of asthma and also reluctant to start Norvasc with potential for LE edema.   After several MyChart messages back and forth we have decided to keep her on Losartan 100mg  daily and Cardizem CD 240mg  daily. She also has a history of hypokalemia on potassium supplementation Kdur 43meq daily. Last potassium in our system was mildly low at 3.3. Patient was started on spironolactone and titrated to 25mg  daily.   At last HTN visit blood pressure was improved to 118/86, but patient was still tachycardic. Patient had also started to log her sodium intake. She was still experiences episodes where she felt feel hot/dizzy/faint/sweaty. Blood pressure nor blood sugar were low, but patient was tachycardic with most events. We discussed how her events could be related to vasomotor instability. Risk for stroke/cardiovascular disease is too high to use estrogen to treat. Patient is currently using OTC medications for this. Declined to try clonidine for these symptoms after reviewing side effects. She was started on low dose carvedilol 3.125mg  twice a day to help with rate and pressure, however patient could not tolerate due to feeling like there was "cotton in her throat" and couldn't breath after 2-4 doses.  Discussed an event monitor with Dr. Debara Pickett who said he and the patient could discuss. Patient blood pressure cuff was compared to manual cuff at previous visit. Her monitor read 123/86, ours read 118/86. Her cuff appears about  accurate.   Patient presents today for follow up. She forgot her log book in the car but states that her systolic BP is 123456 about 50% of the time. Her diastolic averages about upper 90's. HR has been about 90-100's at rest. HR went up to 127 during one of her events the other day.  She is still watching her sodium intake and is trying to quite drinking soda. Today is day # 2. She only eats once a day because she isn't hungry, yet is overweight. She is frustrated because she cannot exercise due to the fact that she gets extremely hot and dripping sweat with minimal exertion.  Current HTN meds: losartan 100mg  daily, cardizem 240mg  daily, spironolactone 25mg  daily Previously tried: irbesartan, valsartan BP goal: <130/80  Family History: The patient's family history includes Breast cancer in her maternal grandmother; COPD in her father, maternal aunt, and maternal grandmother; Colon polyps in her father, maternal aunt, maternal grandmother, mother, and sister; Diabetes in her maternal aunt, maternal grandmother, and sister; Heart disease in her maternal grandmother, mother, and sister; Hypertension in her father; Ovarian cancer in her paternal aunt.   Social History: The patient  reports that she has never smoked. She has never used smokeless tobacco. She reports that she does not drink alcohol or use drugs.   Diet: eats only once or twice a day - eats out most of time (  vegetable plate) steak, mashed potatoes, [employer] grilled chicken sandwiches, fish, slaw, hushpupies, chicken quesedilla, chips/salsa, frosted minnie wheats, dr pepper 32 oz per day, 2% milk, sweet tea (rare)  Exercise: none because she becomes faint and overly sweaty with minimal exertions  Home BP readings: forgot log book in car, but states that BP is about the same  Wt Readings from Last 3 Encounters:  03/22/19 213 lb 12.8 oz (97 kg)  02/25/19 215 lb (97.5 kg)  02/15/19 215 lb (97.5 kg)   BP Readings from Last 3 Encounters:    04/24/19 118/86  04/05/19 (!) 136/98  03/22/19 (!) 150/108   Pulse Readings from Last 3 Encounters:  04/24/19 (!) 108  04/05/19 76  03/22/19 81    Renal function: CrCl cannot be calculated (Patient's most recent lab result is older than the maximum 21 days allowed.).  Past Medical History:  Diagnosis Date  . Allergy   . Anxiety   . Arthritis   . Asthma   . Cataract    right eye   . Depression   . Fatigue   . Fibromyalgia   . GERD (gastroesophageal reflux disease)   . Heart murmur   . HLD (hyperlipidemia)   . Hyperglycemia   . Hypertension   . Hypothyroidism   . IBS (irritable bowel syndrome)   . Insomnia   . Migraine   . Mitral valve prolapse   . Osteopenia   . Pre-diabetes   . PVC (premature ventricular contraction)   . Stein-Leventhal syndrome   . Wears glasses     Current Outpatient Medications on File Prior to Visit  Medication Sig Dispense Refill  . ACCU-CHEK GUIDE test strip USE AS DIRECTED ONCE A DAY    . albuterol (PROVENTIL HFA;VENTOLIN HFA) 108 (90 BASE) MCG/ACT inhaler Inhale 2 puffs into the lungs every 6 (six) hours as needed for wheezing or shortness of breath.     Marland Kitchen aspirin EC 81 MG tablet Take 81 mg by mouth daily.    Marland Kitchen atorvastatin (LIPITOR) 40 MG tablet TAKE 1 TABLET (40 MG TOTAL) BY MOUTH DAILY AT 6 PM. 90 tablet 3  . Black Cohosh 40 MG CAPS Take 40 mg by mouth daily.     . clindamycin (CLEOCIN) 300 MG capsule Take 2 capsules (600 mg total) by mouth as directed. Take 2 capsules 1 hour prior to dental work, including routine cleaning. 8 capsule 0  . clonazePAM (KLONOPIN) 0.5 MG tablet Take 0.5 mg by mouth at bedtime. take a 0.5 mg dose during the day as needed for anxiety  0  . diltiazem (CARDIZEM CD) 240 MG 24 hr capsule Take 1 capsule (240 mg total) by mouth daily.    . DULoxetine (CYMBALTA) 60 MG capsule Take 120 mg by mouth daily.   3  . levothyroxine (SYNTHROID, LEVOTHROID) 125 MCG tablet Take 125 mcg by mouth daily before breakfast.    .  loratadine (CLARITIN) 10 MG tablet Take 10 mg by mouth daily as needed for allergies.     Marland Kitchen losartan (COZAAR) 25 MG tablet Take 4 tablets (100 mg total) by mouth daily. 360 tablet 3  . metFORMIN (GLUCOPHAGE-XR) 500 MG 24 hr tablet Take 500 mg by mouth daily.     . Multiple Vitamin (MULTIVITAMIN WITH MINERALS) TABS tablet Take 1 tablet by mouth daily. Vita Fusion    . naproxen sodium (ALEVE) 220 MG tablet Take 440 mg by mouth daily as needed (migraines).    . Nutritional Supplements (ESTROVEN PO) Take 1  tablet by mouth daily.     . pantoprazole (PROTONIX) 40 MG tablet Take 1 tablet (40 mg total) by mouth daily. 90 tablet 3  . Potassium Chloride ER 20 MEQ TBCR Take 1 tablet by mouth daily. 90 tablet 3  . Rhubarb (ESTROVEN MENOPAUSE RELIEF PO) Take 1 tablet by mouth daily.    Marland Kitchen spironolactone (ALDACTONE) 25 MG tablet Take 1 tablet (25 mg total) by mouth daily. 90 tablet 3  . Vitamin D, Ergocalciferol, (DRISDOL) 50000 UNITS CAPS capsule Take 50,000 Units by mouth every Sunday.      Current Facility-Administered Medications on File Prior to Visit  Medication Dose Route Frequency Provider Last Rate Last Admin  . triamcinolone acetonide (KENALOG) 10 MG/ML injection 10 mg  10 mg Other Once Landis Martins, DPM        Allergies  Allergen Reactions  . Augmentin [Amoxicillin-Pot Clavulanate] Shortness Of Breath and Swelling    Did it involve swelling of the face/tongue/throat, SOB, or low BP? Yes Did it involve sudden or severe rash/hives, skin peeling, or any reaction on the inside of your mouth or nose? No Did you need to seek medical attention at a hospital or doctor's office? No When did it last happen?10 years ago If all above answers are "NO", may proceed with cephalosporin use.   . Erythromycin Shortness Of Breath and Swelling  . Lyrica [Pregabalin] Shortness Of Breath and Swelling  . Macrobid [Nitrofurantoin Macrocrystal] Swelling  . Metaxalone Shortness Of Breath  . Neurontin  [Gabapentin] Shortness Of Breath and Swelling  . Ofloxacin Shortness Of Breath and Swelling  . Pamelor Shortness Of Breath and Swelling  . Relafen [Nabumetone] Shortness Of Breath and Swelling  . Septra [Bactrim] Shortness Of Breath and Swelling  . Sulfasalazine Shortness Of Breath  . Escitalopram Oxalate Other (See Comments)    Unsure of exact reaction type    There were no vitals taken for this visit.   Assessment/Plan:  1. Hypertension - Blood pressure is slightly above goal of <130/80 in clinic today. HR was actually well controlled today in clinic, but reports elevation at home. Will increase diltiazem to 360mg  daily. Discussed that this may not be enough for her blood pressure, but will increase mainly to help heart rate. Will follow up in 2 weeks. If BP still elevated will increase spironolactone to 50mg  daily. Will also do workup for secondary HTN. TSH, renin/aldosterone and plasma metanephrines. Although vague she does have several symptoms of pheochromocytoma. Discussed this with patient who would like to rule this out. Patient will also schedule appointment with Dr. Debara Pickett to discuss an event monitor. Follow up via telephone in 2 weeks.   Thank you  Ramond Dial, Pharm.D, BCPS, CPP [place]  [id number], [place], [place] [address]  Phone: [phone]; Fax: [phone]

## 2019-06-02 LAB — ALDOSTERONE + RENIN ACTIVITY W/ RATIO
ALDOS/RENIN RATIO: 14.1 (ref 0.0–30.0)
ALDOSTERONE: 14.6 ng/dL (ref 0.0–30.0)
Renin: 1.039 ng/mL/hr (ref 0.167–5.380)

## 2019-06-02 LAB — METANEPHRINES, PLASMA
Metanephrine, Free: <10 pg/mL (ref 0.0–88.0)
Normetanephrine, Free: 68.6 pg/mL (ref 0.0–191.8)

## 2019-06-02 LAB — TSH: TSH: 1.42 u[IU]/mL (ref 0.450–4.500)

## 2019-06-07 ENCOUNTER — Telehealth: Payer: Self-pay | Admitting: Pharmacist

## 2019-06-07 NOTE — Telephone Encounter (Signed)
2/8: 138/88 HR 103 2/10: 123/85 HR 89  2/10: 134/81 HR 88 2/12 144/81 HR 85 2/13 128/113 HR 117 2/15 131/87 HR 91 2/18 115/83 HR 86  Patient has been on higher diltiazem dose about a week  Doesn't get quite as hot, still get SOB  I have recommended that she make appointment with Dr. Debara Pickett to discuss SOB. Will continue same medications for now- dilitazem 360mg  daily, spironolactone 25mg  daily, losartan 25mg  daily. I will call patient in 2 weeks to follow up. If BP is still above goal, will plan to increase spironolactone to 50mg  daily.

## 2019-06-21 DIAGNOSIS — N3090 Cystitis, unspecified without hematuria: Secondary | ICD-10-CM | POA: Diagnosis not present

## 2019-06-21 DIAGNOSIS — R1084 Generalized abdominal pain: Secondary | ICD-10-CM | POA: Diagnosis not present

## 2019-06-22 ENCOUNTER — Telehealth: Payer: Self-pay | Admitting: Pharmacist

## 2019-06-22 NOTE — Telephone Encounter (Signed)
Was seen by urgent care - was having stomach pains- found to have Large hiatal hernia that's pushing on diaphragm and a UTI  2/19: 129/92 HR 111 2/23 119/87 HR 96 2/25 113/75 HR 96 3/1 118/81 HR 98 3/4 132/89 HR 91 3/5 113/76 HR 73   Doesn't have appointment with GI until April.  I will call patient in 1 month to follow up on BP

## 2019-06-24 ENCOUNTER — Other Ambulatory Visit: Payer: Self-pay | Admitting: Cardiovascular Disease

## 2019-06-29 DIAGNOSIS — E039 Hypothyroidism, unspecified: Secondary | ICD-10-CM | POA: Diagnosis not present

## 2019-06-29 DIAGNOSIS — J4520 Mild intermittent asthma, uncomplicated: Secondary | ICD-10-CM | POA: Diagnosis not present

## 2019-06-29 DIAGNOSIS — E119 Type 2 diabetes mellitus without complications: Secondary | ICD-10-CM | POA: Diagnosis not present

## 2019-06-29 DIAGNOSIS — M797 Fibromyalgia: Secondary | ICD-10-CM | POA: Diagnosis not present

## 2019-06-29 DIAGNOSIS — E7800 Pure hypercholesterolemia, unspecified: Secondary | ICD-10-CM | POA: Diagnosis not present

## 2019-06-29 DIAGNOSIS — F324 Major depressive disorder, single episode, in partial remission: Secondary | ICD-10-CM | POA: Diagnosis not present

## 2019-06-29 DIAGNOSIS — K219 Gastro-esophageal reflux disease without esophagitis: Secondary | ICD-10-CM | POA: Diagnosis not present

## 2019-06-29 DIAGNOSIS — I10 Essential (primary) hypertension: Secondary | ICD-10-CM | POA: Diagnosis not present

## 2019-06-29 DIAGNOSIS — F419 Anxiety disorder, unspecified: Secondary | ICD-10-CM | POA: Diagnosis not present

## 2019-07-05 MED ORDER — LOSARTAN POTASSIUM 100 MG PO TABS: 100.0000 mg | ORAL_TABLET | Freq: Every day | ORAL | 1 refills | Status: DC

## 2019-07-13 ENCOUNTER — Telehealth: Payer: Self-pay | Admitting: Physician Assistant

## 2019-07-13 NOTE — Telephone Encounter (Signed)
   The patient called the answering service after-hours today. Delay in callback due to hospital emergency and high volume of calls at same time. She describes an occasional shocking pain into her right arm and right breast. No hx ICD. It will go down her side down into her leg and foot. She wasn't sure if it could be heart related. We discussed that these symptoms would be extremely atypical for cardiac pain although not able to fully reassure over the phone if she feels this is acutely bothersome to her and ongoing. EKG may be helpful while having symptoms along with basic electrolytes to ensure no unusual changes. We discussed options of urgent care/ED and contacting PCP. The patient verbalized understanding and gratitude.  Charlie Pitter, PA-C

## 2019-07-13 NOTE — Telephone Encounter (Signed)
Thanks Dayna. I'll follow-up with her.  -Mali

## 2019-07-14 DIAGNOSIS — M542 Cervicalgia: Secondary | ICD-10-CM | POA: Diagnosis not present

## 2019-07-14 DIAGNOSIS — E039 Hypothyroidism, unspecified: Secondary | ICD-10-CM

## 2019-07-14 DIAGNOSIS — M79601 Pain in right arm: Secondary | ICD-10-CM

## 2019-07-14 DIAGNOSIS — R519 Headache, unspecified: Secondary | ICD-10-CM | POA: Diagnosis not present

## 2019-07-14 DIAGNOSIS — R200 Anesthesia of skin: Secondary | ICD-10-CM

## 2019-07-14 DIAGNOSIS — J181 Lobar pneumonia, unspecified organism: Secondary | ICD-10-CM | POA: Diagnosis not present

## 2019-07-14 DIAGNOSIS — R29701 NIHSS score 1: Secondary | ICD-10-CM | POA: Diagnosis present

## 2019-07-14 DIAGNOSIS — Z79899 Other long term (current) drug therapy: Secondary | ICD-10-CM | POA: Diagnosis not present

## 2019-07-14 DIAGNOSIS — M47812 Spondylosis without myelopathy or radiculopathy, cervical region: Secondary | ICD-10-CM | POA: Diagnosis not present

## 2019-07-14 DIAGNOSIS — K219 Gastro-esophageal reflux disease without esophagitis: Secondary | ICD-10-CM

## 2019-07-14 DIAGNOSIS — J45909 Unspecified asthma, uncomplicated: Secondary | ICD-10-CM | POA: Diagnosis present

## 2019-07-14 DIAGNOSIS — M4802 Spinal stenosis, cervical region: Secondary | ICD-10-CM | POA: Diagnosis present

## 2019-07-14 DIAGNOSIS — G459 Transient cerebral ischemic attack, unspecified: Secondary | ICD-10-CM | POA: Diagnosis not present

## 2019-07-14 DIAGNOSIS — F418 Other specified anxiety disorders: Secondary | ICD-10-CM | POA: Diagnosis present

## 2019-07-14 DIAGNOSIS — E119 Type 2 diabetes mellitus without complications: Secondary | ICD-10-CM

## 2019-07-14 DIAGNOSIS — J189 Pneumonia, unspecified organism: Secondary | ICD-10-CM | POA: Diagnosis present

## 2019-07-14 DIAGNOSIS — I10 Essential (primary) hypertension: Secondary | ICD-10-CM | POA: Diagnosis not present

## 2019-07-14 DIAGNOSIS — F329 Major depressive disorder, single episode, unspecified: Secondary | ICD-10-CM

## 2019-07-14 DIAGNOSIS — G629 Polyneuropathy, unspecified: Secondary | ICD-10-CM | POA: Diagnosis present

## 2019-07-14 DIAGNOSIS — Z8673 Personal history of transient ischemic attack (TIA), and cerebral infarction without residual deficits: Secondary | ICD-10-CM | POA: Diagnosis not present

## 2019-07-15 DIAGNOSIS — E119 Type 2 diabetes mellitus without complications: Secondary | ICD-10-CM | POA: Diagnosis not present

## 2019-07-15 DIAGNOSIS — F329 Major depressive disorder, single episode, unspecified: Secondary | ICD-10-CM | POA: Diagnosis not present

## 2019-07-15 DIAGNOSIS — E039 Hypothyroidism, unspecified: Secondary | ICD-10-CM | POA: Diagnosis not present

## 2019-07-15 DIAGNOSIS — K219 Gastro-esophageal reflux disease without esophagitis: Secondary | ICD-10-CM | POA: Diagnosis not present

## 2019-07-16 ENCOUNTER — Encounter: Payer: Self-pay | Admitting: Gastroenterology

## 2019-07-16 DIAGNOSIS — K219 Gastro-esophageal reflux disease without esophagitis: Secondary | ICD-10-CM | POA: Diagnosis not present

## 2019-07-16 DIAGNOSIS — E119 Type 2 diabetes mellitus without complications: Secondary | ICD-10-CM | POA: Diagnosis not present

## 2019-07-16 DIAGNOSIS — F329 Major depressive disorder, single episode, unspecified: Secondary | ICD-10-CM | POA: Diagnosis not present

## 2019-07-16 DIAGNOSIS — E039 Hypothyroidism, unspecified: Secondary | ICD-10-CM | POA: Diagnosis not present

## 2019-07-20 ENCOUNTER — Telehealth: Payer: Self-pay | Admitting: Pharmacist

## 2019-07-20 NOTE — Telephone Encounter (Signed)
Called patient to follow up on her blood pressures. She just got out of the hospital for PNA and shingles. HR still spikes. Went to 130 when she was in the hospital. Blood pressures are as follows.  3/6: 126/78  HR 87 3/11: 117/80 HR 84 3/20: 128/85 HR 107 3/21: 108/74 HR 85          129/80 HR 86          116/86 HR 111          116/78 HR 85  I encouraged her to make appointment with Dr. Debara Pickett to discuss HR issue. She is on max dose of diltiazem and cannot tolerate beta blockers due to SOB.  Will call patient in another month to follow up on blood pressures. No changes made today.

## 2019-07-24 ENCOUNTER — Ambulatory Visit (INDEPENDENT_AMBULATORY_CARE_PROVIDER_SITE_OTHER): Payer: Medicare Other | Admitting: Internal Medicine

## 2019-07-24 ENCOUNTER — Other Ambulatory Visit: Payer: Self-pay

## 2019-07-24 VITALS — BP 110/82 | HR 79 | Temp 97.3°F | Ht 65.0 in | Wt 223.0 lb

## 2019-07-24 DIAGNOSIS — R000 Tachycardia, unspecified: Secondary | ICD-10-CM

## 2019-07-24 DIAGNOSIS — I10 Essential (primary) hypertension: Secondary | ICD-10-CM | POA: Diagnosis not present

## 2019-07-24 DIAGNOSIS — Z8774 Personal history of (corrected) congenital malformations of heart and circulatory system: Secondary | ICD-10-CM

## 2019-07-24 NOTE — Progress Notes (Signed)
OFFICE NOTE  Chief Complaint:  Follow-up PFO closure, tachycardia, flushing  Primary Care Physician: Shirline Frees, MD  HPI:  Glenda Evans is a 64 y.o. female with a past medical history significant for some anxiety, fibromyalgia, hypertension, palpitations and hypothyroidism. She initially had evaluation of her palpitations in the early 2000's by Dr. Pernell Dupre. At the time she wore monitor, had a stress test and echocardiogram. The echocardiogram suggested mitral valve prolapse. Her monitor failed to show clear etiology of her symptoms. She does have a history of PVCs on her chart however those were not noted by EKG today. Recently she said she's had some increasing burden of palpitations. She denies any worsening stress. She also has sharp chest discomfort. She feels like this is different than her fibromyalgia, however it was reproduced during auscultation with my stethoscope today. She's had close to 20 pound weight gain over the past several months due to some orthopedic problems in her feet which she says is decreased her exercise. This certainly could explain some of her shortness of breath. She feels like the episodes of palpitations that occur very infrequently are paroxysmal. Oftentimes include heart racing and then can be followed by feelings of significant fatigue which is worse than her daily chronic fatigue.  09/17/2015  Glenda Evans returns today for follow-up. She wore a monitor which demonstrated PACs and has had some PVCs. Her echocardiogram showed normal systolic function with mild diastolic dysfunction. There was no evidence of mitral valve prolapse that was diagnostically significant. We talked about possible medication changes to try to help with her palpitations. She does note that she has significant side effects from verapamil including possible constipation.   12/26/2015  Glenda Evans returns today for follow-up. She reports marked improvement in her palpitations  with adjustment in her medications. Blood pressure is excellent today at 120/80. She denies any chest pain or worsening shortness of breath. She continues to be fatigued. Sleep is poor at night and generally she is quite awake. Occasionally she gas for breath but does not report heavy snoring. She naps a lot during the day.  05/14/2016  Glenda Evans reports improvement in her palpitations on diltiazem and she is pleased with good blood pressure control. She continues to struggle with daytime somnolence and insomnia at night. She manages only to get a couple hours of sleep at night but generally can sleep 6-8 hours during the day. He feels chronically fatigued. She underwent a sleep study which was negative for obstructive sleep apnea however might of been concern for either restless leg syndrome which she vehemently denies or perhaps narcolepsy disorder. Dr. Claiborne Billings ordered and an MSLT test. This is pending. She does use clonazepam to try to help with sleep and anxiety at night but she says is rarely effective. Her main issues not being able to turn her brain off at night.  12/16/2016  Glenda Evans was seen today in follow-up. Recently she's been concerned about leg pain and swelling. She did have a sleep study which was negative for sleep apnea. She reports her palpitations have improved on diltiazem. She does carry a diagnosis of fibromyalgia may have chronic fatigue syndrome. In addition she has some degree of neuropathy but has been intolerant to neuropathic pain medicines. She reports shooting pain down both legs. Seems to be worse in the right. There is some associated swelling. It's worse when she walks and gets better when she rests. She reports some cold feet. She's concerned as her husband  recently had popliteal aneurysms and ultimately had to have amputation of one of his legs. She's also concerned about same prominent veins as it may be related to varicose veins. She wonders if she may have had a  blood clot.  06/22/2018  Glenda Evans is seen today in routine follow-up.  Unfortunately recently she had a stroke when she was visiting a family member [facility].  She was under significant stress and ended up being hospitalized she has had for 2 weeks.  She underwent various imaging studies and was felt that her symptoms were due to stress.  She does have a history of elevated cholesterol and most recently her lipid profile in February 2019 did show a direct LDL 69 and triglycerides of 450.  She had previously been on WelChol but was not on statin therapy.  She did see Dr. Leonie Man with [facility] neurology who recommended her starting on atorvastatin 20 mg daily.  Currently however she is not on that medication has not been on it for several months because she was not sure why she was taking it.  Her last lipid profile was in March 2019 again showed a total cholesterol 184, HDL 41, LDL 91 and triglycerides of 260.  Blood pressure today was 132/80.  She has managed to lose about 8 pounds and is working on dietary modification.  Recently, she was also diagnosed with type 2 diabetes and her hemoglobin A1c was over 6.8.  She started on metformin which has helped her sugars and also surprisingly helped symptoms of leg pain which she had.  As part of her work-up she did have MRI/MRA.  This did demonstrate a bovine configuration of her aortic arch but no evidence of carotid artery disease, stenosis, aneurysm or vascular occlusion.  Surprisingly, I did locate an echocardiogram performed on 225/2019, which shows an LVEF of 50 to 55%.  If one looks really closely a bubble study was performed and under the right atrial tab it does indicate there was a right to left interatrial shunt after injection of agitated saline contrast.  This suggest that a PFO is noted.  I was able to locate the neurology consult note which indicated positive PFO but negative Dopplers for DVT.  Transcranial Doppler indicated normal intracranial  velocities.  There was no suggestion of pursuing the PFO as a cause of TIA, since LE venous dopplers were performed and were negative for DVT.  12/06/2018  Glenda Evans is seen today in follow-up.  She was supposed to undergo TEE to evaluate for PFO however with COVID this was delayed.  She indicates that she is interested in pursuing this.  Unfortunately she has not been compliant with aspirin.  I asked her to restart that today.  Otherwise she is asymptomatic.  She denies any chest pain or worsening shortness of breath.  She has had no further TIAs but does get some occasional dizzy episodes which she thinks could be related.  EKG is normal today.  Blood pressure is normal as well.  07/25/2019  Glenda Evans is seen today for follow-up.  She underwent successful closure of her PFO and there was no residual shunting.  Unfortunately, she subsequently developed some inappropriate tachycardia and flushing.  She was seen by our pharmacist Marcelle Overlie, who uptitrated her calcium channel blocker and sent a number of labs including work-up for pheochromocytoma.  Not surprisingly this was negative.  Over a short period of time her heart rate has come down.  She is also been struggling  with possible shingles or some type of neuropathic pain in her upper chest that started under her right armpit and extends under the right breast.  She has been on Valtrex and gabapentin without much relief.   PMHx:  Past Medical History:  Diagnosis Date  . Allergy   . Anxiety   . Arthritis   . Asthma   . Cataract    right eye   . Depression   . Fatigue   . Fibromyalgia   . GERD (gastroesophageal reflux disease)   . Heart murmur   . HLD (hyperlipidemia)   . Hyperglycemia   . Hypertension   . Hypothyroidism   . IBS (irritable bowel syndrome)   . Insomnia   . Migraine   . Mitral valve prolapse   . Osteopenia   . Pre-diabetes   . PVC (premature ventricular contraction)   . Stein-Leventhal syndrome   . Wears  glasses     Past Surgical History:  Procedure Laterality Date  . ABDOMINAL HYSTERECTOMY    . BUBBLE STUDY  01/04/2019   Procedure: BUBBLE STUDY;  Surgeon: Pixie Casino, MD;  Location: [place];  Service: Cardiovascular;;  . CESAREAN SECTION     x 2  . CHOLECYSTECTOMY    . COLONOSCOPY    . LIPOMA EXCISION  11/26/10   thigh x3 cyst  . OVARY SURGERY     to poke holes in ovaries for PCOD  . PATENT FORAMEN OVALE(PFO) CLOSURE N/A 02/15/2019   Procedure: PATENT FORAMEN OVALE (PFO) CLOSURE;  Surgeon: Sherren Mocha, MD;  Location: [place] [facility];  Service: Cardiovascular;  Laterality: N/A;  . TEE WITHOUT CARDIOVERSION N/A 01/04/2019   Procedure: TRANSESOPHAGEAL ECHOCARDIOGRAM (TEE);  Surgeon: Pixie Casino, MD;  Location: [facility] ENDOSCOPY;  Service: Cardiovascular;  Laterality: N/A;  . TONSILLECTOMY      FAMHx:  Family History  Problem Relation Age of Onset  . COPD Father   . Hypertension Father   . Colon polyps Father   . Diabetes Sister   . Colon polyps Mother   . Heart disease Mother        MVP  . Breast cancer Maternal Grandmother   . Colon polyps Maternal Grandmother   . Diabetes Maternal Grandmother   . Heart disease Maternal Grandmother   . COPD Maternal Grandmother   . Ovarian cancer Paternal Aunt   . Colon polyps Sister   . Colon polyps Maternal Aunt        x 2  . Diabetes Maternal Aunt   . Heart disease Sister        MVP  . COPD Maternal Aunt   . Colon cancer Neg Hx   . Esophageal cancer Neg Hx   . Rectal cancer Neg Hx   . Stomach cancer Neg Hx     SOCHx:   reports that she has never smoked. She has never used smokeless tobacco. She reports that she does not drink alcohol or use drugs.  ALLERGIES:  Allergies  Allergen Reactions  . Augmentin [Amoxicillin-Pot Clavulanate] Shortness Of Breath and Swelling    Did it involve swelling of the face/tongue/throat, SOB, or low BP? Yes Did it involve sudden or severe rash/hives, skin peeling, or any reaction  on the inside of your mouth or nose? No Did you need to seek medical attention at a hospital or doctor's office? No When did it last happen?10 years ago If all above answers are "NO", may proceed with cephalosporin use.   . Carvedilol Shortness Of  Breath    Felt like she had cotton in her throat- felt like it was hard to breath  . Erythromycin Shortness Of Breath and Swelling  . Lyrica [Pregabalin] Shortness Of Breath and Swelling  . Macrobid [Nitrofurantoin Macrocrystal] Swelling  . Metaxalone Shortness Of Breath  . Neurontin [Gabapentin] Shortness Of Breath and Swelling  . Ofloxacin Shortness Of Breath and Swelling  . Pamelor Shortness Of Breath and Swelling  . Relafen [Nabumetone] Shortness Of Breath and Swelling  . Septra [Bactrim] Shortness Of Breath and Swelling  . Sulfasalazine Shortness Of Breath  . Escitalopram Oxalate Other (See Comments)    Unsure of exact reaction type    ROS: Pertinent items noted in HPI and remainder of comprehensive ROS otherwise negative.  HOME MEDS: Current Outpatient Medications  Medication Sig Dispense Refill  . Accu-Chek FastClix Lancets MISC FOR USE WHEN CHECKING BLOODSUGARS AS DIRECTED ONCE DAILY DX E11.9 102 DAYS    . ACCU-CHEK GUIDE test strip USE AS DIRECTED ONCE A DAY    . albuterol (PROVENTIL HFA;VENTOLIN HFA) 108 (90 BASE) MCG/ACT inhaler Inhale 2 puffs into the lungs every 6 (six) hours as needed for wheezing or shortness of breath.     Marland Kitchen aspirin EC 81 MG tablet Take 81 mg by mouth daily.    Marland Kitchen atorvastatin (LIPITOR) 40 MG tablet TAKE 1 TABLET (40 MG TOTAL) BY MOUTH DAILY AT 6 PM. 90 tablet 3  . Black Cohosh 40 MG CAPS Take 40 mg by mouth daily.     . clindamycin (CLEOCIN) 300 MG capsule Take 2 capsules (600 mg total) by mouth as directed. Take 2 capsules 1 hour prior to dental work, including routine cleaning. 8 capsule 0  . clonazePAM (KLONOPIN) 0.5 MG tablet Take 0.5 mg by mouth at bedtime. take a 0.5 mg dose during the day as needed  for anxiety  0  . diltiazem (CARDIZEM CD) 360 MG 24 hr capsule Take 1 capsule (360 mg total) by mouth daily. 90 capsule 3  . diltiazem (TIAZAC) 240 MG 24 hr capsule Take by mouth.    . DULoxetine (CYMBALTA) 60 MG capsule Take 120 mg by mouth daily.   3  . gabapentin (NEURONTIN) 300 MG capsule Take 300 mg by mouth 3 (three) times daily.    Marland Kitchen levothyroxine (SYNTHROID, LEVOTHROID) 125 MCG tablet Take 125 mcg by mouth daily before breakfast.    . loratadine (CLARITIN) 10 MG tablet Take 10 mg by mouth daily as needed for allergies.     Marland Kitchen losartan (COZAAR) 100 MG tablet Take 1 tablet (100 mg total) by mouth daily. 90 tablet 1  . metFORMIN (GLUCOPHAGE-XR) 500 MG 24 hr tablet Take 500 mg by mouth daily.     . Multiple Vitamin (MULTIVITAMIN WITH MINERALS) TABS tablet Take 1 tablet by mouth daily. Vita Fusion    . naproxen sodium (ALEVE) 220 MG tablet Take 440 mg by mouth daily as needed (migraines).    . Nutritional Supplements (ESTROVEN PO) Take 1 tablet by mouth daily.     . pantoprazole (PROTONIX) 40 MG tablet Take 1 tablet (40 mg total) by mouth daily. 90 tablet 3  . Potassium Chloride ER 20 MEQ TBCR Take 1 tablet by mouth daily. 90 tablet 3  . Pseudoephedrine-Acetaminophen (SM NON-ASPRIN SINUS PO) SMARTSIG:1 Tablet(s) By Mouth Daily    . Rhubarb (ESTROVEN MENOPAUSE RELIEF PO) Take 1 tablet by mouth daily.    Marland Kitchen spironolactone (ALDACTONE) 25 MG tablet Take 1 tablet (25 mg total) by mouth daily. [place]  tablet 3  . valACYclovir (VALTREX) 1000 MG tablet Take 1,000 mg by mouth 3 (three) times daily.    . Vitamin D, Ergocalciferol, (DRISDOL) 50000 UNITS CAPS capsule Take 50,000 Units by mouth every Sunday.      Current Facility-Administered Medications  Medication Dose Route Frequency Provider Last Rate Last Admin  . triamcinolone acetonide (KENALOG) 10 MG/ML injection 10 mg  10 mg Other Once Landis Martins, DPM        LABS/IMAGING: No results found for this or any previous visit (from the past 48  hour(s)). No results found.  WEIGHTS: Wt Readings from Last 3 Encounters:  07/24/19 223 lb (101.2 kg)  03/22/19 213 lb 12.8 oz (97 kg)  02/25/19 215 lb (97.5 kg)    VITALS: BP 110/82   Pulse 79   Temp (!) 97.3 F (36.3 C)   Ht 5\' 5"  (1.651 m)   Wt 223 lb (101.2 kg)   SpO2 95%   BMI 37.11 kg/m   EXAM: General appearance: alert and no distress Neck: no carotid bruit and no JVD Lungs: clear to auscultation bilaterally Heart: regular rate and rhythm, S1, S2 normal, no murmur, click, rub or gallop Abdomen: soft, non-tender; bowel sounds normal; no masses,  no organomegaly Extremities: extremities normal, atraumatic, no cyanosis or edema and varicose veins noted Pulses: 2+ and symmetric Skin: Skin color, texture, turgor normal. No rashes or lesions Neurologic: Grossly normal Psych: Moderately anxious  EKG: Normal sinus rhythm at 79-personally reviewed  ASSESSMENT: 1. Inappropriate tachycardia 2. Recurrent TIA without imaging evidence of stroke 3. PFO with right to left shunt 4. Low normal LVEF 50 to 55% (2019) 5. Bovine aortic arch 6. Bilateral leg pain and swelling 7. Palpitations - PAC's and PVC's (improved on diltiazem) 8. Dyspnea on exertion - normal systolic function and mild diastolic dysfunction 9. Essential hypertension 10. Atypical chest wall pain-likely related to fibromyalgia 11. Anxiety 12. Hypersomnolence  PLAN: 1.   Glenda Evans has had successful closure of her PFO.  Recently she has been having inappropriate tachycardia but also struggled with a radicular pain in the chest thought to be possibly shingles although she never had an outbreak.  She has been on Valtrex and gabapentin without much improvement.  Heart rate does seem to have trended down somewhat although can still increase with minimal exertion.  She seems to be tolerating the increased dose of diltiazem.  I would not like to adjust any medications now to give her a little more time to see if  things improve.  Follow-up with me in 6 months.  Pixie Casino, MD, [facility], [place] Director of the [facility]  [facility] Diplomate of the American Board of Clinical Lipidology Attending Cardiologist  Direct Dial: [phone]  Fax: [phone]  Website:  [email or web address] 07/24/2019, 3:43 PM

## 2019-07-24 NOTE — Patient Instructions (Signed)
Medication Instructions:  Continue current medications  *If you need a refill on your cardiac medications before your next appointment, please call your pharmacy*   Lab Work: None Ordered  Testing/Procedures: None Ordered   Follow-Up: At [facility], you and your health needs are our priority.  As part of our continuing mission to provide you with exceptional heart care, we have created designated Provider Care Teams.  These Care Teams include your primary Cardiologist (physician) and Advanced Practice Providers (APPs -  Physician Assistants and Nurse Practitioners) who all work together to provide you with the care you need, when you need it.  We recommend signing up for the patient portal called "MyChart".  Sign up information is provided on this After Visit Summary.  MyChart is used to connect with patients for Virtual Visits (Telemedicine).  Patients are able to view lab/test results, encounter notes, upcoming appointments, etc.  Non-urgent messages can be sent to your provider as well.   To learn more about what you can do with MyChart, go to [email or web address].    Your next appointment:   4 month(s)  The format for your next appointment:   In Person  Provider:   You may see Pixie Casino, MD or one of the following Advanced Practice Providers on your designated Care Team:    Almyra Deforest, PA-C  Fabian Sharp, PA-C or   Roby Lofts, [place]

## 2019-07-25 ENCOUNTER — Encounter: Payer: Self-pay | Admitting: Internal Medicine

## 2019-07-27 DIAGNOSIS — B029 Zoster without complications: Secondary | ICD-10-CM | POA: Diagnosis not present

## 2019-07-27 DIAGNOSIS — J189 Pneumonia, unspecified organism: Secondary | ICD-10-CM | POA: Diagnosis not present

## 2019-08-01 ENCOUNTER — Ambulatory Visit (INDEPENDENT_AMBULATORY_CARE_PROVIDER_SITE_OTHER): Payer: Medicare Other | Admitting: Gastroenterology

## 2019-08-01 ENCOUNTER — Encounter: Payer: Self-pay | Admitting: Gastroenterology

## 2019-08-01 ENCOUNTER — Other Ambulatory Visit (INDEPENDENT_AMBULATORY_CARE_PROVIDER_SITE_OTHER): Payer: Medicare Other

## 2019-08-01 VITALS — BP 114/70 | HR 60 | Temp 98.5°F | Ht 65.0 in | Wt 223.0 lb

## 2019-08-01 DIAGNOSIS — R1030 Lower abdominal pain, unspecified: Secondary | ICD-10-CM

## 2019-08-01 DIAGNOSIS — K219 Gastro-esophageal reflux disease without esophagitis: Secondary | ICD-10-CM

## 2019-08-01 LAB — BASIC METABOLIC PANEL
BUN: 22 mg/dL (ref 6–23)
CO2: 29 mEq/L (ref 19–32)
Calcium: 9.8 mg/dL (ref 8.4–10.5)
Chloride: 102 mEq/L (ref 96–112)
Creatinine, Ser: 1.01 mg/dL (ref 0.40–1.20)
GFR: 55.67 mL/min — ABNORMAL LOW (ref >60.00)
Glucose, Bld: 142 mg/dL — ABNORMAL HIGH (ref 70–99)
Potassium: 4.6 mEq/L (ref 3.5–5.1)
Sodium: 138 mEq/L (ref 135–145)

## 2019-08-01 MED ORDER — PANTOPRAZOLE SODIUM 40 MG PO TBEC: 40.0000 mg | DELAYED_RELEASE_TABLET | Freq: Two times a day (BID) | ORAL | 1 refills | Status: DC

## 2019-08-01 NOTE — Progress Notes (Addendum)
History of Present Illness: This is a 26 referred by Shirline Frees, MD for the evaluation of GERD, lower abdominal pain and bloating.  She relates frequent breakthrough reflux symptoms.  The symptoms have worsened over the past few years.  She relates a new problem of a stabbing lower abdominal pain that occurs intermittently for about 2 months.  She notes the pain occurs intermittently and may last anywhere from 15 minutes to several hours.  It is not clearly associated with meals, bowel movements or positioning.  She has also had difficulties with constipation and frequent abdominal bloating.  She states she often eats 1 meal per day to minimize postprandial bloating.  She relates being told of a hiatal hernia on plain films of her chest and abdomen however the radiology reports only indicate hyperinflation of the lungs, mild thoracolumbar scoliosis and no acute abdominal findings.  CBC, CMP, lipase, H. pylori IgG all unremarkable from June 21, 2019 except for mildly elevated ALT at 37.  Denies weight loss, diarrhea, change in stool caliber, melena, hematochezia, nausea, vomiting, dysphagia, chest pain.   Colonoscopy 05/2017 - One 7 mm polyp in the transverse colon, removed with a cold snare. Resected and retrieved. - The examination was otherwise normal on direct and retroflexion views.  Barium esophagram 10/2015: Nonspecific esophageal motility disorder. Small hiatal hernia   Allergies  Allergen Reactions  . Augmentin [Amoxicillin-Pot Clavulanate] Shortness Of Breath and Swelling    Did it involve swelling of the face/tongue/throat, SOB, or low BP? Yes Did it involve sudden or severe rash/hives, skin peeling, or any reaction on the inside of your mouth or nose? No Did you need to seek medical attention at a hospital or doctor's office? No When did it last happen?10 years ago If all above answers are "NO", may proceed with cephalosporin use.   . Carvedilol Shortness Of Breath    Felt  like she had cotton in her throat- felt like it was hard to breath  . Erythromycin Shortness Of Breath and Swelling  . Lyrica [Pregabalin] Shortness Of Breath and Swelling  . Macrobid [Nitrofurantoin Macrocrystal] Swelling  . Metaxalone Shortness Of Breath  . Ofloxacin Shortness Of Breath and Swelling  . Pamelor Shortness Of Breath and Swelling  . Relafen [Nabumetone] Shortness Of Breath and Swelling  . Septra [Bactrim] Shortness Of Breath and Swelling  . Sulfasalazine Shortness Of Breath  . Escitalopram Oxalate Other (See Comments)    Unsure of exact reaction type   Outpatient Medications Prior to Visit  Medication Sig Dispense Refill  . Accu-Chek FastClix Lancets MISC FOR USE WHEN CHECKING BLOODSUGARS AS DIRECTED ONCE DAILY DX E11.9 102 DAYS    . ACCU-CHEK GUIDE test strip USE AS DIRECTED ONCE A DAY    . albuterol (PROVENTIL HFA;VENTOLIN HFA) 108 (90 BASE) MCG/ACT inhaler Inhale 2 puffs into the lungs every 6 (six) hours as needed for wheezing or shortness of breath.     Marland Kitchen aspirin EC 81 MG tablet Take 81 mg by mouth daily.    Marland Kitchen atorvastatin (LIPITOR) 40 MG tablet TAKE 1 TABLET (40 MG TOTAL) BY MOUTH DAILY AT 6 PM. 90 tablet 3  . Black Cohosh 40 MG CAPS Take 40 mg by mouth daily.     . clindamycin (CLEOCIN) 300 MG capsule Take 2 capsules (600 mg total) by mouth as directed. Take 2 capsules 1 hour prior to dental work, including routine cleaning. 8 capsule 0  . clonazePAM (KLONOPIN) 0.5 MG tablet Take 0.5 mg by mouth  at bedtime. take a 0.5 mg dose during the day as needed for anxiety  0  . diltiazem (CARDIZEM CD) 360 MG 24 hr capsule Take 1 capsule (360 mg total) by mouth daily. 90 capsule 3  . DULoxetine (CYMBALTA) 60 MG capsule Take 120 mg by mouth daily.   3  . gabapentin (NEURONTIN) 300 MG capsule Take 300 mg by mouth 3 (three) times daily.    Marland Kitchen levothyroxine (SYNTHROID, LEVOTHROID) 125 MCG tablet Take 125 mcg by mouth daily before breakfast.    . loratadine (CLARITIN) 10 MG tablet  Take 10 mg by mouth daily as needed for allergies.     Marland Kitchen losartan (COZAAR) 100 MG tablet Take 1 tablet (100 mg total) by mouth daily. 90 tablet 1  . metFORMIN (GLUCOPHAGE-XR) 500 MG 24 hr tablet Take 500 mg by mouth daily.     . Multiple Vitamin (MULTIVITAMIN WITH MINERALS) TABS tablet Take 1 tablet by mouth daily. Vita Fusion    . naproxen sodium (ALEVE) 220 MG tablet Take 440 mg by mouth daily as needed (migraines).    . Nutritional Supplements (ESTROVEN PO) Take 1 tablet by mouth daily.     . pantoprazole (PROTONIX) 40 MG tablet Take 1 tablet (40 mg total) by mouth daily. 90 tablet 3  . Potassium Chloride ER 20 MEQ TBCR Take 1 tablet by mouth daily. 90 tablet 3  . Pseudoephedrine-Acetaminophen (SM NON-ASPRIN SINUS PO) SMARTSIG:1 Tablet(s) By Mouth Daily    . Rhubarb (ESTROVEN MENOPAUSE RELIEF PO) Take 1 tablet by mouth daily.    Marland Kitchen spironolactone (ALDACTONE) 25 MG tablet Take 1 tablet (25 mg total) by mouth daily. 90 tablet 3  . valACYclovir (VALTREX) 1000 MG tablet Take 1,000 mg by mouth 3 (three) times daily.    Marland Kitchen diltiazem (TIAZAC) 240 MG 24 hr capsule Take by mouth.    . Vitamin D, Ergocalciferol, (DRISDOL) 50000 UNITS CAPS capsule Take 50,000 Units by mouth every Sunday.      Facility-Administered Medications Prior to Visit  Medication Dose Route Frequency Provider Last Rate Last Admin  . triamcinolone acetonide (KENALOG) 10 MG/ML injection 10 mg  10 mg Other Once Landis Martins, DPM       Past Medical History:  Diagnosis Date  . Allergy   . Anxiety   . Arthritis   . Asthma   . Cataract    right eye   . Depression   . Fatigue   . Fibromyalgia   . GERD (gastroesophageal reflux disease)   . Heart murmur   . HLD (hyperlipidemia)   . Hyperglycemia   . Hypertension   . Hypothyroidism   . IBS (irritable bowel syndrome)   . Insomnia   . Migraine   . Mitral valve prolapse   . Osteopenia   . Pre-diabetes   . PVC (premature ventricular contraction)   . Stein-Leventhal  syndrome   . Wears glasses    Past Surgical History:  Procedure Laterality Date  . ABDOMINAL HYSTERECTOMY    . BUBBLE STUDY  01/04/2019   Procedure: BUBBLE STUDY;  Surgeon: Pixie Casino, MD;  Location: [facility];  Service: Cardiovascular;;  . CESAREAN SECTION     x 2  . CHOLECYSTECTOMY    . COLONOSCOPY    . LIPOMA EXCISION  11/26/10   thigh x3 cyst  . OVARY SURGERY     to poke holes in ovaries for PCOD  . PATENT FORAMEN OVALE(PFO) CLOSURE N/A 02/15/2019   Procedure: PATENT FORAMEN OVALE (PFO) CLOSURE;  Surgeon:  Sherren Mocha, MD;  Location: [facility];  Service: Cardiovascular;  Laterality: N/A;  . TEE WITHOUT CARDIOVERSION N/A 01/04/2019   Procedure: TRANSESOPHAGEAL ECHOCARDIOGRAM (TEE);  Surgeon: Pixie Casino, MD;  Location: [facility] ENDOSCOPY;  Service: Cardiovascular;  Laterality: N/A;  . TONSILLECTOMY     Social History   Socioeconomic History  . Marital status: Divorced    Spouse name: Not on file  . Number of children: 2  . Years of education: Not on file  . Highest education level: Not on file  Occupational History  . Occupation: disabled  Tobacco Use  . Smoking status: Never Smoker  . Smokeless tobacco: Never Used  Substance and Sexual Activity  . Alcohol use: No    Alcohol/week: 0.0 standard drinks  . Drug use: No  . Sexual activity: Not on file  Other Topics Concern  . Not on file  Social History Narrative  . Not on file   Social Determinants of Health   Financial Resource Strain:   . Difficulty of Paying Living Expenses:   Food Insecurity:   . Worried About Charity fundraiser in the Last Year:   . Arboriculturist in the Last Year:   Transportation Needs:   . Film/video editor (Medical):   Marland Kitchen Lack of Transportation (Non-Medical):   Physical Activity:   . Days of Exercise per Week:   . Minutes of Exercise per Session:   Stress:   . Feeling of Stress :   Social Connections:   . Frequency of Communication with Friends and Family:     . Frequency of Social Gatherings with Friends and Family:   . Attends Religious Services:   . Active Member of Clubs or Organizations:   . Attends Archivist Meetings:   Marland Kitchen Marital Status:    Family History  Problem Relation Age of Onset  . COPD Father   . Hypertension Father   . Colon polyps Father   . Diabetes Sister   . Colon polyps Mother   . Heart disease Mother        MVP  . Breast cancer Maternal Grandmother   . Colon polyps Maternal Grandmother   . Diabetes Maternal Grandmother   . Heart disease Maternal Grandmother   . COPD Maternal Grandmother   . Ovarian cancer Paternal Aunt   . Colon polyps Sister   . Colon polyps Maternal Aunt        x 2  . Diabetes Maternal Aunt   . Heart disease Sister        MVP  . COPD Maternal Aunt   . Colon cancer Neg Hx   . Esophageal cancer Neg Hx   . Rectal cancer Neg Hx   . Stomach cancer Neg Hx       Review of Systems: Pertinent positive and negative review of systems were noted in the above HPI section. All other review of systems were otherwise negative.   Physical Exam: General: Well developed, well nourished, no acute distress Head: Normocephalic and atraumatic Eyes:  sclerae anicteric, EOMI Ears: Normal auditory acuity Mouth: Not examined, mask on during Covid-19 pandemic Neck: Supple, no masses or thyromegaly Lungs: Clear throughout to auscultation Heart: Regular rate and rhythm; no murmurs, rubs or bruits Abdomen: Soft, mild epigastric tenderness and non distended. No masses, hepatosplenomegaly or hernias noted. Normal Bowel sounds Rectal: Not done Musculoskeletal: Symmetrical with no gross deformities  Skin: No lesions on visible extremities Pulses:  Normal pulses noted Extremities: No  clubbing, cyanosis, edema or deformities noted Neurological: Alert oriented x 4, grossly nonfocal Cervical Nodes:  No significant cervical adenopathy Inguinal Nodes: No significant inguinal adenopathy Psychological:   Alert and cooperative. Normal mood and affect   Assessment and Recommendations:  1. Lower abdominal pain, bloating, constipation.  Mild epigastric tenderness on exam.  Schedule CT AP.  Begin MiraLAX once daily titrated for complete bowel movement daily.  If results not adequate increased to MiraLAX twice daily.  REV in 6 weeks.   2. GERD.  Small hiatal hernia noted on barium esophagram in 2017.  Increase pantoprazole to 40 mg twice daily.  Intensify all antireflux measures.  Consider EGD if symptoms not adequately controlled.  3.  Mildly elevated ALT potentially secondary to hepatic steatosis.  Abdominal ultrasound in March 2016 showed hepatic steatosis.  Repeat LFTs at REV. CT AP as indicated for problem #1 we will provide further evaluation.   4. Personal history of adenomatous colon polyps.  Surveillance colonoscopy recommended in February 2024.   cc: Shirline Frees, MD [address] [place],  [place] [id number]

## 2019-08-01 NOTE — Addendum Note (Signed)
Addended by: Lucio Edward T on: 08/01/2019 11:52 AM   Modules accepted: Level of Service

## 2019-08-01 NOTE — Patient Instructions (Addendum)
Your provider has requested that you go to the basement level for lab work before leaving today. Press "B" on the elevator. The lab is located at the first door on the left as you exit the elevator.  Increase your pantoprazole 40 mg to twice daily. A new prescription has been sent to your pharmacy.   Start over counter Miralax mixing 17 grams in 8 oz of water daily x 1 -2 weeks.   You have been scheduled for a CT scan of the abdomen and pelvis at [facility], 1st floor Radiology.  You are scheduled on 08/15/19 at 10:30am. You should arrive 15 minutes prior to your appointment time for registration.   Please go to [facility] Radiology at least 3 days prior to your procedure to pick up instructions and contrast for the exam.  WARNING: IF YOU ARE ALLERGIC TO IODINE/X-RAY DYE, PLEASE NOTIFY RADIOLOGY IMMEDIATELY AT [phone]! YOU WILL BE GIVEN A 13 HOUR PREMEDICATION PREP.   If you have any questions regarding your exam or if you need to reschedule, you may call [phone] between the hours of 8:00 am and 5:00 pm, Monday-Friday.  __________________________________________________________________  Thank you for choosing me and [facility].  [name]. [name]., MD., Marval Regal

## 2019-08-13 ENCOUNTER — Other Ambulatory Visit: Payer: Self-pay | Admitting: Family Medicine

## 2019-08-13 ENCOUNTER — Ambulatory Visit
Admission: RE | Admit: 2019-08-13 | Discharge: 2019-08-13 | Disposition: A | Discharge status: Home / Self Care | Payer: Medicare Other | Source: Ambulatory Visit | Attending: Family Medicine | Admitting: Family Medicine

## 2019-08-13 DIAGNOSIS — J189 Pneumonia, unspecified organism: Secondary | ICD-10-CM

## 2019-08-13 DIAGNOSIS — R0602 Shortness of breath: Secondary | ICD-10-CM | POA: Diagnosis not present

## 2019-08-13 DIAGNOSIS — Z01419 Encounter for gynecological examination (general) (routine) without abnormal findings: Secondary | ICD-10-CM | POA: Diagnosis not present

## 2019-08-15 ENCOUNTER — Encounter (HOSPITAL_COMMUNITY): Payer: Self-pay

## 2019-08-15 ENCOUNTER — Other Ambulatory Visit: Payer: Self-pay

## 2019-08-15 ENCOUNTER — Ambulatory Visit (HOSPITAL_COMMUNITY)
Admission: RE | Admit: 2019-08-15 | Discharge: 2019-08-15 | Disposition: A | Discharge status: Home / Self Care | Payer: Medicare Other | Source: Ambulatory Visit | Attending: Gastroenterology | Admitting: Gastroenterology

## 2019-08-15 DIAGNOSIS — R1030 Lower abdominal pain, unspecified: Secondary | ICD-10-CM | POA: Insufficient documentation

## 2019-08-15 DIAGNOSIS — K449 Diaphragmatic hernia without obstruction or gangrene: Secondary | ICD-10-CM | POA: Diagnosis not present

## 2019-08-15 MED ORDER — IOHEXOL 300 MG/ML  SOLN
100.0000 mL | Freq: Once | INTRAMUSCULAR | Status: AC | PRN
  Administered 2019-08-15: 100 mL via INTRAVENOUS

## 2019-08-20 NOTE — Telephone Encounter (Signed)
Followed up with patient. She states she is feeling better. Almost over the shingles. States BP is mainly [id number] diastolic. Occasional diastolic in the [id number]. Continue current medications and follow up in HTN clinic only as needed. Follow up with Dr. Okey Regal in 6 months.

## 2019-08-22 DIAGNOSIS — J0100 Acute maxillary sinusitis, unspecified: Secondary | ICD-10-CM | POA: Diagnosis not present

## 2019-08-22 DIAGNOSIS — H9209 Otalgia, unspecified ear: Secondary | ICD-10-CM | POA: Diagnosis not present

## 2019-08-30 DIAGNOSIS — B0229 Other postherpetic nervous system involvement: Secondary | ICD-10-CM | POA: Diagnosis not present

## 2019-09-12 ENCOUNTER — Encounter: Payer: Self-pay | Admitting: Gastroenterology

## 2019-09-12 ENCOUNTER — Ambulatory Visit (INDEPENDENT_AMBULATORY_CARE_PROVIDER_SITE_OTHER): Payer: Medicare Other | Admitting: Gastroenterology

## 2019-09-12 VITALS — BP 110/80 | HR 72 | Ht 65.0 in | Wt 226.0 lb

## 2019-09-12 DIAGNOSIS — R1030 Lower abdominal pain, unspecified: Secondary | ICD-10-CM | POA: Diagnosis not present

## 2019-09-12 DIAGNOSIS — K5904 Chronic idiopathic constipation: Secondary | ICD-10-CM | POA: Diagnosis not present

## 2019-09-12 DIAGNOSIS — K219 Gastro-esophageal reflux disease without esophagitis: Secondary | ICD-10-CM

## 2019-09-12 NOTE — Progress Notes (Signed)
    History of Present Illness: This is a 64 year old female with GERD, constipation, lower abdominal pain.  After increasing her PPI to twice daily and taking MiraLAX on a daily basis her reflux symptoms have substantially improved and her constipation and abdominal pain have resolved.  She notes breakthrough reflux symptoms occasionally in particular when she misses her second dose of pantoprazole.  CT AP 08/15/2019 IMPRESSION: 1. No acute abnormality. No evidence of bowel obstruction or acute bowel inflammation. Normal appendix. 2. Small hiatal hernia.  Current Medications, Allergies, Past Medical History, Past Surgical History, Family History and Social History were reviewed in [employer] record.   Physical Exam: General: Well developed, well nourished, no acute distress Head: Normocephalic and atraumatic Eyes:  sclerae anicteric, EOMI Ears: Normal auditory acuity Mouth: Not examined, mask on during Covid-19 pandemic Lungs: Clear throughout to auscultation Heart: Regular rate and rhythm; no murmurs, rubs or bruits Abdomen: Soft, non tender and non distended. No masses, hepatosplenomegaly or hernias noted. Normal Bowel sounds Rectal: Not done Musculoskeletal: Symmetrical with no gross deformities  Pulses:  Normal pulses noted Extremities: No clubbing, cyanosis, edema or deformities noted Neurological: Alert oriented x 4, grossly nonfocal Psychological:  Alert and cooperative. Normal mood and affect   Assessment and Recommendations:  1.  GERD.  Continue pantoprazole 40 mg p.o. twice daily.  Closely follow antireflux measures.  Tums as needed.  Pepcid AC twice daily as needed. REV in 1 year.  2.  Constipation leading to lower abdominal pain.  Symptoms resolved on current regimen of daily MiraLAX.  Continue MiraLAX.  3.  Family history of colon cancer.  Personal history of adenomatous colon polyps.  A 5-year interval colonoscopy is recommended in February  2024.

## 2019-09-12 NOTE — Patient Instructions (Signed)
Stay on pantoprazole twice daily and Miralax daily.   Thank you for choosing me and [facility].  [name]. [name]., MD., Marval Regal

## 2019-11-09 DIAGNOSIS — J209 Acute bronchitis, unspecified: Secondary | ICD-10-CM | POA: Diagnosis not present

## 2019-11-21 DIAGNOSIS — H5213 Myopia, bilateral: Secondary | ICD-10-CM | POA: Diagnosis not present

## 2019-11-26 ENCOUNTER — Other Ambulatory Visit: Payer: Self-pay | Admitting: Physician Assistant

## 2019-11-26 DIAGNOSIS — Z8774 Personal history of (corrected) congenital malformations of heart and circulatory system: Secondary | ICD-10-CM

## 2019-11-26 DIAGNOSIS — G459 Transient cerebral ischemic attack, unspecified: Secondary | ICD-10-CM

## 2019-12-13 ENCOUNTER — Other Ambulatory Visit (HOSPITAL_COMMUNITY): Payer: Medicare Other

## 2019-12-20 ENCOUNTER — Other Ambulatory Visit: Payer: Self-pay | Admitting: Obstetrics and Gynecology

## 2019-12-20 DIAGNOSIS — Z1231 Encounter for screening mammogram for malignant neoplasm of breast: Secondary | ICD-10-CM

## 2019-12-24 DIAGNOSIS — J029 Acute pharyngitis, unspecified: Secondary | ICD-10-CM | POA: Diagnosis not present

## 2019-12-26 DIAGNOSIS — J0100 Acute maxillary sinusitis, unspecified: Secondary | ICD-10-CM | POA: Diagnosis not present

## 2020-01-07 ENCOUNTER — Other Ambulatory Visit: Payer: Self-pay | Admitting: Internal Medicine

## 2020-01-21 ENCOUNTER — Other Ambulatory Visit: Payer: Self-pay | Admitting: Cardiovascular Disease

## 2020-01-24 ENCOUNTER — Encounter: Payer: Self-pay | Admitting: Physician Assistant

## 2020-01-24 ENCOUNTER — Ambulatory Visit
Admission: RE | Admit: 2020-01-24 | Discharge: 2020-01-24 | Disposition: A | Discharge status: Home / Self Care | Payer: Medicare Other | Source: Ambulatory Visit | Attending: Obstetrics and Gynecology | Admitting: Obstetrics and Gynecology

## 2020-01-24 ENCOUNTER — Ambulatory Visit (INDEPENDENT_AMBULATORY_CARE_PROVIDER_SITE_OTHER): Payer: Medicare Other | Admitting: Physician Assistant

## 2020-01-24 ENCOUNTER — Other Ambulatory Visit: Payer: Self-pay

## 2020-01-24 ENCOUNTER — Ambulatory Visit (HOSPITAL_COMMUNITY): Payer: Medicare Other | Attending: Internal Medicine

## 2020-01-24 VITALS — BP 110/74 | HR 87 | Ht 65.0 in | Wt 225.0 lb

## 2020-01-24 DIAGNOSIS — K219 Gastro-esophageal reflux disease without esophagitis: Secondary | ICD-10-CM | POA: Diagnosis not present

## 2020-01-24 DIAGNOSIS — Z8774 Personal history of (corrected) congenital malformations of heart and circulatory system: Secondary | ICD-10-CM | POA: Diagnosis not present

## 2020-01-24 DIAGNOSIS — I10 Essential (primary) hypertension: Secondary | ICD-10-CM | POA: Diagnosis not present

## 2020-01-24 DIAGNOSIS — Z1231 Encounter for screening mammogram for malignant neoplasm of breast: Secondary | ICD-10-CM | POA: Diagnosis not present

## 2020-01-24 DIAGNOSIS — R000 Tachycardia, unspecified: Secondary | ICD-10-CM | POA: Diagnosis not present

## 2020-01-24 DIAGNOSIS — G459 Transient cerebral ischemic attack, unspecified: Secondary | ICD-10-CM | POA: Diagnosis present

## 2020-01-24 LAB — ECHOCARDIOGRAM COMPLETE BUBBLE STUDY
Area-P 1/2: 2.73 cm2
S' Lateral: 3.1 cm

## 2020-01-24 NOTE — Patient Instructions (Signed)
Medication Instructions:  1) CONTINUE Spironolactone (you may take this with Nexium) *If you need a refill on your cardiac medications before your next appointment, please call your pharmacy*  Follow-Up: Your provider recommends that you schedule a follow-up appointment AS NEEDED with Dr. Burt Knack.  Please continue your regular follow-up with Dr. Debara Pickett.

## 2020-01-24 NOTE — Progress Notes (Signed)
[facility] Glenda Evans                                       Cardiology Office Note    Date:  01/25/2020   ID:  Glenda Evans, DOB 1955-11-12, MRN [id number]  PCP:  Glenda Frees, MD  Cardiologist: Dr. Debara Pickett  CC: 1 year s/p PFO closure   History of Present Illness:  Glenda Evans is a 64 y.o. female with a history of anxiety, fibromyalgia, IBS, TIA and PFO s/p PFO closure (02/15/19) who presents to clinic for follow up.   The patient has a history of TIA in 2019. She was visiting a family member at [facility] when she suddenly developed vision loss and collapse. She later had right sided weakness and numbness. She was seen by Dr Leonie Man in outpatient consultation in June 2019 and ongoing medical therapy was recommended with a focus on CV risk reduction. She has had no recurrent stroke/TIA symptoms. TEE 01/04/2019 demonstrated a moderate PFO with associated atrial septum aneurysm, positive R-->L shunt by agitated saline evaluation. Normal LV function and no significant valvular disease and she was referred to Dr. Burt Knack for consideration of transcatheter PFO closure. She admited to intermittent chest pain and heaviness but no exertional component as well as palpitations at her initial consult with Dr. Burt Knack.   She underwent successful transcatheter PFO closure using an 18 mm Amplatzer PFO occluder device on 02/15/19. Post op echo showed EF55-60%, normal device function with trivial pericardial effusion. She was discharged on aspirin and plavix.   She was seen in the ER on 02/25/19 for evaluation of weakness, chest pain and HA. Apparently she had unstable and shuffling gait. Her work up (including brain MRI) was unremarkable and she was discharged home. She was treated with an oral Abx syrup in the ER for UTI. She said her BP (especially DBP) has been running high. Her BP meds were adjusted. She was seen in the HTN clinic for follow up. She was  seen by our pharmacist Marcelle Overlie, who uptitrated her calcium channel blocker and sent a number of labs including work-up for pheochromocytoma which was negative. She was also diagnosed with inappropriate sinus tachycardia and placed on diltiazem.   Today she presents to clinic for follow up. No CP but has chronic SOB from asthma and bronchitis. No LE edema, orthopnea or PND. No dizziness or syncope. No blood in stool or urine. No palpitations.     Past Medical History:  Diagnosis Date  . Allergy   . Anxiety   . Arthritis   . Asthma   . Cataract    right eye   . Depression   . Fatigue   . Fibromyalgia   . GERD (gastroesophageal reflux disease)   . Heart murmur   . HLD (hyperlipidemia)   . Hyperglycemia   . Hypertension   . Hypothyroidism   . IBS (irritable bowel syndrome)   . Insomnia   . Migraine   . Mitral valve prolapse   . Osteopenia   . Pre-diabetes   . PVC (premature ventricular contraction)   . Stein-Leventhal syndrome   . Wears glasses     Past Surgical History:  Procedure Laterality Date  . ABDOMINAL HYSTERECTOMY    . BUBBLE STUDY  01/04/2019   Procedure: BUBBLE STUDY;  Surgeon: Pixie Casino, MD;  Location: [facility]  ENDOSCOPY;  Service: Cardiovascular;;  . CESAREAN SECTION     x 2  . CHOLECYSTECTOMY    . COLONOSCOPY    . LIPOMA EXCISION  11/26/10   thigh x3 cyst  . OVARY SURGERY     to poke holes in ovaries for PCOD  . PATENT FORAMEN OVALE(PFO) CLOSURE N/A 02/15/2019   Procedure: PATENT FORAMEN OVALE (PFO) CLOSURE;  Surgeon: Sherren Mocha, MD;  Location: [facility] [place] CV LAB;  Service: Cardiovascular;  Laterality: N/A;  . TEE WITHOUT CARDIOVERSION N/A 01/04/2019   Procedure: TRANSESOPHAGEAL ECHOCARDIOGRAM (TEE);  Surgeon: Pixie Casino, MD;  Location: [facility] ENDOSCOPY;  Service: Cardiovascular;  Laterality: N/A;  . TONSILLECTOMY      Current Medications: Outpatient Medications Prior to Visit  Medication Sig Dispense Refill  . Accu-Chek FastClix Lancets  MISC FOR USE WHEN CHECKING BLOODSUGARS AS DIRECTED ONCE DAILY DX E11.9 102 DAYS    . ACCU-CHEK GUIDE test strip USE AS DIRECTED ONCE A DAY    . albuterol (PROVENTIL HFA;VENTOLIN HFA) 108 (90 BASE) MCG/ACT inhaler Inhale 2 puffs into the lungs every 6 (six) hours as needed for wheezing or shortness of breath.     Marland Kitchen aspirin EC 81 MG tablet Take 81 mg by mouth daily.    Marland Kitchen atorvastatin (LIPITOR) 40 MG tablet TAKE 1 TABLET (40 MG TOTAL) BY MOUTH DAILY AT 6 PM. 90 tablet 3  . Black Cohosh 40 MG CAPS Take 40 mg by mouth daily.     . clonazePAM (KLONOPIN) 0.5 MG tablet Take 0.5 mg by mouth at bedtime. take a 0.5 mg dose during the day as needed for anxiety  0  . diltiazem (CARDIZEM CD) 360 MG 24 hr capsule Take 1 capsule (360 mg total) by mouth daily. 90 capsule 3  . DULoxetine (CYMBALTA) 60 MG capsule Take 120 mg by mouth daily.   3  . esomeprazole (NEXIUM) 40 MG capsule Take 40 mg by mouth daily.    Marland Kitchen levothyroxine (SYNTHROID, LEVOTHROID) 125 MCG tablet Take 125 mcg by mouth daily before breakfast.    . loratadine (CLARITIN) 10 MG tablet Take 10 mg by mouth daily as needed for allergies.     Marland Kitchen losartan (COZAAR) 100 MG tablet TAKE 1 TABLET BY MOUTH EVERY DAY 90 tablet 1  . metFORMIN (GLUCOPHAGE-XR) 500 MG 24 hr tablet Take 500 mg by mouth daily.     . Multiple Vitamin (MULTIVITAMIN WITH MINERALS) TABS tablet Take 1 tablet by mouth daily. Vita Fusion    . naproxen sodium (ALEVE) 220 MG tablet Take 440 mg by mouth daily as needed (migraines).    . Nutritional Supplements (ESTROVEN PO) Take 1 tablet by mouth daily.     . Potassium Chloride ER 20 MEQ TBCR Take 1 tablet by mouth daily. 90 tablet 3  . Vitamin D, Ergocalciferol, (DRISDOL) 1.25 MG (50000 UNIT) CAPS capsule Take 50,000 Units by mouth once a week.    . pantoprazole (PROTONIX) 40 MG tablet Take 1 tablet (40 mg total) by mouth 2 (two) times daily. 60 tablet 1  . gabapentin (NEURONTIN) 300 MG capsule Take 300 mg by mouth 3 (three) times daily.    Marland Kitchen  spironolactone (ALDACTONE) 25 MG tablet Take 1 tablet (25 mg total) by mouth daily. 90 tablet 3   Facility-Administered Medications Prior to Visit  Medication Dose Route Frequency Provider Last Rate Last Admin  . triamcinolone acetonide (KENALOG) 10 MG/ML injection 10 mg  10 mg Other Once Landis Martins, DPM  Allergies:   Augmentin [amoxicillin-pot clavulanate], Carvedilol, Erythromycin, Lyrica [pregabalin], Macrobid [nitrofurantoin macrocrystal], Metaxalone, Ofloxacin, Pamelor, Relafen [nabumetone], Septra [bactrim], Sulfasalazine, and Escitalopram oxalate   Social History   Socioeconomic History  . Marital status: Divorced    Spouse name: Not on file  . Number of children: 2  . Years of education: Not on file  . Highest education level: Not on file  Occupational History  . Occupation: disabled  Tobacco Use  . Smoking status: Never Smoker  . Smokeless tobacco: Never Used  Substance and Sexual Activity  . Alcohol use: No    Alcohol/week: 0.0 standard drinks  . Drug use: No  . Sexual activity: Not on file  Other Topics Concern  . Not on file  Social History Narrative  . Not on file   Social Determinants of Health   Financial Resource Strain:   . Difficulty of Paying Living Expenses: Not on file  Food Insecurity:   . Worried About Charity fundraiser in the Last Year: Not on file  . Ran Out of Food in the Last Year: Not on file  Transportation Needs:   . Lack of Transportation (Medical): Not on file  . Lack of Transportation (Non-Medical): Not on file  Physical Activity:   . Days of Exercise per Week: Not on file  . Minutes of Exercise per Session: Not on file  Stress:   . Feeling of Stress : Not on file  Social Connections:   . Frequency of Communication with Friends and Family: Not on file  . Frequency of Social Gatherings with Friends and Family: Not on file  . Attends Religious Services: Not on file  . Active Member of Clubs or Organizations: Not on file    . Attends Archivist Meetings: Not on file  . Marital Status: Not on file     Family History:  The patient's family history includes Breast cancer in her maternal grandmother; COPD in her father, maternal aunt, and maternal grandmother; Colon polyps in her father, maternal aunt, maternal grandmother, mother, and sister; Diabetes in her maternal aunt, maternal grandmother, and sister; Heart disease in her maternal grandmother, mother, and sister; Hypertension in her father; Ovarian cancer in her paternal aunt.     ROS:   Please see the history of present illness.    ROS All other systems reviewed and are negative.   PHYSICAL EXAM:   VS:  BP 110/74   Pulse 87   Ht 5\' 5"  (1.651 m)   Wt 225 lb (102.1 kg)   SpO2 95%   BMI 37.44 kg/m    GEN: Well nourished, well developed, in no acute distress HEENT: normal Neck: no JVD or masses Cardiac: RRR; no murmurs, rubs, or gallops,no edema  Respiratory:  clear to auscultation bilaterally, normal work of breathing GI: soft, nontender, nondistended, + BS MS: no deformity or atrophy Skin: warm and dry, no rash Neuro:  Alert and Oriented x 3, Strength and sensation are intact Psych: euthymic mood, full affect   Wt Readings from Last 3 Encounters:  01/24/20 225 lb (102.1 kg)  09/12/19 226 lb (102.5 kg)  08/01/19 223 lb (101.2 kg)      Studies/Labs Reviewed:   EKG:  EKG is NOT ordered today.    Recent Labs: 02/25/2019: Hemoglobin 14.3; Platelets 400 05/24/2019: TSH 1.420 08/01/2019: BUN 22; Creatinine, Ser 1.01; Potassium 4.6; Sodium 138   Lipid Panel    Component Value Date/Time   CHOL 199 07/10/2018 0943   TRIG  231 (H) 07/10/2018 0943   HDL 41 07/10/2018 0943   CHOLHDL 4.9 (H) 07/10/2018 0943   LDLCALC 112 (H) 07/10/2018 0943   LDLDIRECT 128 (H) 07/10/2018 0943    Additional studies/ records that were reviewed today include:   02/15/2019 PATENT FORAMEN OVALE (PFO) CLOSURE  Conclusion Successful transcatheter PFO  closure using an 18 mm Amplatzer PFO occluder device under intracardiac echo and fluoroscopic guidance  Recommendations  Antiplatelet/Anticoag Recommend uninterrupted dual antiplatelet therapy with Aspirin 81mg  daily and Clopidogrel 75mg  daily for 3 months, then ASA 81 mg indefinitely.   _______________  Echo 02/15/19 IMPRESSIONS  1. There is an interatrial septal occluder device present without evidence of shunting.  2. Left ventricular ejection fraction, by visual estimation, is 55 to 60%. The left ventricle has normal function. There is no left ventricular hypertrophy.  3. Global right ventricle has normal systolic function.The right ventricular size is normal. No increase in right ventricular wall thickness.  4. Left atrial size was normal.  5. Right atrial size was normal.  6. Presence of pericardial fat pad.  7. The pericardial effusion is circumferential.  8. Trivial pericardial effusion is present.  9. Mild mitral annular calcification. 10. The mitral valve is degenerative. No evidence of mitral valve regurgitation. No evidence of mitral stenosis. 11. The tricuspid valve is grossly normal. Tricuspid valve regurgitation is not demonstrated. 12. Aortic valve regurgitation not assessed. 13. The aortic valve was not assessed. Aortic valve regurgitation not assessed. 14. The pulmonic valve was not assessed. Pulmonic valve regurgitation not assessed. 15. The aortic root was not well visualized.  ___________________   Echo with bubble 01/24/20 IMPRESSIONS  1. Left ventricular ejection fraction, by estimation, is 55 to 60%. The  left ventricle has normal function. The left ventricle has no regional  wall motion abnormalities. Left ventricular diastolic parameters are  consistent with Grade I diastolic  dysfunction (impaired relaxation).  2. Right ventricular systolic function is normal. The right ventricular  size is normal.  3. The mitral valve is grossly normal. No evidence  of mitral valve  regurgitation.  4. The aortic valve is grossly normal. Aortic valve regurgitation is not  visualized.  5. Agitated saline contrast bubble study was negative, with no evidence  of any interatrial shunt.   Comparison(s): No significant change from prior study. Compared to prior  study (TEE) no evidence of R-L Shunt.   ASSESSMENT & PLAN:   PFO s/p PFO closure: doing well. Echo with bubble today shows normal LV function and negative bubble study. Continue on aspirin alone indefinitely.   GERD: Asked me to Rx Nexium bc Protonix does not work. She was told she could not take Nexium with spironolactone. I checked the interaction and discussed with pharmacy who said they are okay to take together .  HTN: BP well controlled today. No changes made  Inappropriate sinus tachy: HR well controlled today. Continue CCB    Medication Adjustments/Labs and Tests Ordered: Current medicines are reviewed at length with the patient today.  Concerns regarding medicines are outlined above.  Medication changes, Labs and Tests ordered today are listed in the Patient Instructions below. Patient Instructions  Medication Instructions:  1) CONTINUE Spironolactone (you may take this with Nexium) *If you need a refill on your cardiac medications before your next appointment, please call your pharmacy*  Follow-Up: Your provider recommends that you schedule a follow-up appointment AS NEEDED with Dr. Burt Knack.  Please continue your regular follow-up with Dr. Debara Pickett.    Signed, Angelena Form,  PA-C  01/25/2020 8:13 AM    [address], [place], [place]  [address] Phone: [phone]; Fax: [phone]

## 2020-02-28 DIAGNOSIS — Z20828 Contact with and (suspected) exposure to other viral communicable diseases: Secondary | ICD-10-CM | POA: Diagnosis not present

## 2020-02-28 DIAGNOSIS — R0602 Shortness of breath: Secondary | ICD-10-CM | POA: Diagnosis not present

## 2020-02-28 DIAGNOSIS — R051 Acute cough: Secondary | ICD-10-CM | POA: Diagnosis not present

## 2020-02-29 DIAGNOSIS — U071 COVID-19: Secondary | ICD-10-CM | POA: Diagnosis not present

## 2020-03-04 DIAGNOSIS — U071 COVID-19: Secondary | ICD-10-CM | POA: Diagnosis not present

## 2020-03-14 DIAGNOSIS — K5900 Constipation, unspecified: Secondary | ICD-10-CM | POA: Diagnosis not present

## 2020-03-16 DIAGNOSIS — E871 Hypo-osmolality and hyponatremia: Secondary | ICD-10-CM | POA: Diagnosis present

## 2020-03-16 DIAGNOSIS — U071 COVID-19: Secondary | ICD-10-CM | POA: Diagnosis not present

## 2020-03-16 DIAGNOSIS — F419 Anxiety disorder, unspecified: Secondary | ICD-10-CM | POA: Diagnosis present

## 2020-03-16 DIAGNOSIS — E1165 Type 2 diabetes mellitus with hyperglycemia: Secondary | ICD-10-CM | POA: Diagnosis present

## 2020-03-16 DIAGNOSIS — I951 Orthostatic hypotension: Secondary | ICD-10-CM | POA: Diagnosis not present

## 2020-03-16 DIAGNOSIS — R42 Dizziness and giddiness: Secondary | ICD-10-CM | POA: Diagnosis not present

## 2020-03-16 DIAGNOSIS — I5032 Chronic diastolic (congestive) heart failure: Secondary | ICD-10-CM | POA: Diagnosis present

## 2020-03-16 DIAGNOSIS — I110 Hypertensive heart disease with heart failure: Secondary | ICD-10-CM | POA: Diagnosis present

## 2020-03-16 DIAGNOSIS — Z7984 Long term (current) use of oral hypoglycemic drugs: Secondary | ICD-10-CM | POA: Diagnosis not present

## 2020-03-16 DIAGNOSIS — F32A Depression, unspecified: Secondary | ICD-10-CM | POA: Diagnosis present

## 2020-03-16 DIAGNOSIS — J45909 Unspecified asthma, uncomplicated: Secondary | ICD-10-CM | POA: Diagnosis present

## 2020-03-16 DIAGNOSIS — Z79899 Other long term (current) drug therapy: Secondary | ICD-10-CM | POA: Diagnosis not present

## 2020-03-16 DIAGNOSIS — J1282 Pneumonia due to coronavirus disease 2019: Secondary | ICD-10-CM | POA: Diagnosis not present

## 2020-03-16 DIAGNOSIS — E039 Hypothyroidism, unspecified: Secondary | ICD-10-CM | POA: Diagnosis present

## 2020-03-16 DIAGNOSIS — K219 Gastro-esophageal reflux disease without esophagitis: Secondary | ICD-10-CM | POA: Diagnosis present

## 2020-03-16 DIAGNOSIS — R0789 Other chest pain: Secondary | ICD-10-CM | POA: Diagnosis present

## 2020-03-16 DIAGNOSIS — Z8673 Personal history of transient ischemic attack (TIA), and cerebral infarction without residual deficits: Secondary | ICD-10-CM | POA: Diagnosis not present

## 2020-03-16 DIAGNOSIS — E860 Dehydration: Secondary | ICD-10-CM | POA: Diagnosis present

## 2020-03-16 DIAGNOSIS — E7800 Pure hypercholesterolemia, unspecified: Secondary | ICD-10-CM | POA: Diagnosis present

## 2020-03-20 DIAGNOSIS — J4521 Mild intermittent asthma with (acute) exacerbation: Secondary | ICD-10-CM | POA: Diagnosis not present

## 2020-03-24 DIAGNOSIS — U071 COVID-19: Secondary | ICD-10-CM | POA: Diagnosis not present

## 2020-03-24 DIAGNOSIS — J1282 Pneumonia due to coronavirus disease 2019: Secondary | ICD-10-CM | POA: Diagnosis not present

## 2020-03-26 ENCOUNTER — Ambulatory Visit: Payer: Medicaid Other | Admitting: Internal Medicine

## 2020-03-30 DIAGNOSIS — E039 Hypothyroidism, unspecified: Secondary | ICD-10-CM | POA: Diagnosis not present

## 2020-03-30 DIAGNOSIS — E119 Type 2 diabetes mellitus without complications: Secondary | ICD-10-CM | POA: Diagnosis not present

## 2020-03-30 DIAGNOSIS — E7800 Pure hypercholesterolemia, unspecified: Secondary | ICD-10-CM | POA: Diagnosis not present

## 2020-04-03 ENCOUNTER — Telehealth: Payer: Self-pay | Admitting: Internal Medicine

## 2020-04-03 DIAGNOSIS — U071 COVID-19: Secondary | ICD-10-CM | POA: Diagnosis not present

## 2020-04-03 NOTE — Telephone Encounter (Signed)
Left message for Lattie Haw RN with remote health to return call

## 2020-04-03 NOTE — Telephone Encounter (Signed)
Pt c/o of Chest Pain: STAT if CP now or developed within 24 hours  1. Are you having CP right now? No   2. Are you experiencing any other symptoms (ex. SOB, nausea, vomiting, sweating)? Increased HR   3. How long have you been experiencing CP? About 2 days, per remote health RN  4. Is your CP continuous or coming and going? Coming and going, with exertion and activity  5. Have you taken Nitroglycerin? No  ?  STAT if HR is under 50 or over 120 (normal HR is 60-100 beats per minute)  1) What is your heart rate? 126   2) Do you have a log of your heart rate readings (document readings)?  104 117 126  3) Do you have any other symptoms? No

## 2020-04-04 DIAGNOSIS — M67912 Unspecified disorder of synovium and tendon, left shoulder: Secondary | ICD-10-CM | POA: Diagnosis not present

## 2020-04-04 DIAGNOSIS — M5124 Other intervertebral disc displacement, thoracic region: Secondary | ICD-10-CM | POA: Diagnosis not present

## 2020-04-04 DIAGNOSIS — I700 Atherosclerosis of aorta: Secondary | ICD-10-CM | POA: Diagnosis not present

## 2020-04-04 DIAGNOSIS — E1165 Type 2 diabetes mellitus with hyperglycemia: Secondary | ICD-10-CM | POA: Diagnosis not present

## 2020-04-04 DIAGNOSIS — J189 Pneumonia, unspecified organism: Secondary | ICD-10-CM | POA: Diagnosis not present

## 2020-04-04 DIAGNOSIS — E860 Dehydration: Secondary | ICD-10-CM | POA: Diagnosis not present

## 2020-04-04 DIAGNOSIS — R079 Chest pain, unspecified: Secondary | ICD-10-CM | POA: Diagnosis not present

## 2020-04-04 DIAGNOSIS — J90 Pleural effusion, not elsewhere classified: Secondary | ICD-10-CM | POA: Diagnosis not present

## 2020-04-04 DIAGNOSIS — U099 Post covid-19 condition, unspecified: Secondary | ICD-10-CM | POA: Diagnosis not present

## 2020-04-04 DIAGNOSIS — R0602 Shortness of breath: Secondary | ICD-10-CM | POA: Diagnosis not present

## 2020-04-04 DIAGNOSIS — R918 Other nonspecific abnormal finding of lung field: Secondary | ICD-10-CM | POA: Diagnosis not present

## 2020-04-04 NOTE — Telephone Encounter (Signed)
Lattie Haw is returning Jenna's call and states she is wanting Glenda Evans scheduled for a sooner appt. Please advise.

## 2020-04-04 NOTE — Telephone Encounter (Signed)
Spoke with Almyra Deforest PA and Dr. Gwenlyn Found (DOD) about patient concerns. Both providers agreed patient needs to be evaluated. Was able to schedule patient for an appointment on 04/08/20 @ 1145AM  Routed to Dr. Debara Pickett as Juluis Rainier

## 2020-04-04 NOTE — Telephone Encounter (Signed)
Returned call to [employer] with remote health She states that patient told her she has had high HR for at least 1 week She complains of chest pain when she takes deep breath HR goes up with walking Patient told Lattie Haw RN that her HR has been 117bpm at rest  Spoke with patient about her concerns She reports chest pain with exertion, occasionally at rest - radiates across her chest She has a "cold feeling" that goes down into her chest when she takes a deep breath that makes her chest hurt She does not have any BP/HR readings from today She has not seen her PCP about these concerns  Patient had [place] in March 14, 2023 - her husband passed away from this.  Husband passed on 19-Mar-2023 Patient went to [place] on 11/28 for dehydration from COVID

## 2020-04-04 NOTE — Telephone Encounter (Signed)
Thanks, I agree with evaluation  Dr Lemmie Evens

## 2020-04-08 ENCOUNTER — Other Ambulatory Visit: Payer: Self-pay

## 2020-04-08 ENCOUNTER — Ambulatory Visit (INDEPENDENT_AMBULATORY_CARE_PROVIDER_SITE_OTHER): Payer: Medicare Other | Admitting: Physician Assistant

## 2020-04-08 VITALS — BP 102/80 | HR 82 | Ht 65.0 in | Wt 213.0 lb

## 2020-04-08 DIAGNOSIS — R5383 Other fatigue: Secondary | ICD-10-CM | POA: Diagnosis not present

## 2020-04-08 DIAGNOSIS — G4733 Obstructive sleep apnea (adult) (pediatric): Secondary | ICD-10-CM | POA: Diagnosis not present

## 2020-04-08 DIAGNOSIS — E785 Hyperlipidemia, unspecified: Secondary | ICD-10-CM

## 2020-04-08 DIAGNOSIS — Q211 Atrial septal defect: Secondary | ICD-10-CM

## 2020-04-08 DIAGNOSIS — J4530 Mild persistent asthma, uncomplicated: Secondary | ICD-10-CM | POA: Diagnosis not present

## 2020-04-08 DIAGNOSIS — R7303 Prediabetes: Secondary | ICD-10-CM | POA: Diagnosis not present

## 2020-04-08 DIAGNOSIS — Q2112 Patent foramen ovale: Secondary | ICD-10-CM

## 2020-04-08 DIAGNOSIS — I10 Essential (primary) hypertension: Secondary | ICD-10-CM | POA: Diagnosis not present

## 2020-04-08 DIAGNOSIS — R000 Tachycardia, unspecified: Secondary | ICD-10-CM | POA: Diagnosis not present

## 2020-04-08 DIAGNOSIS — R079 Chest pain, unspecified: Secondary | ICD-10-CM

## 2020-04-08 DIAGNOSIS — Z8616 Personal history of COVID-19: Secondary | ICD-10-CM | POA: Diagnosis not present

## 2020-04-08 NOTE — Progress Notes (Signed)
Cardiology Office Note:    Date:  04/10/2020   ID:  Glenda Evans, DOB 08-20-1955, MRN [id number]  PCP:  Shirline Frees, MD  [facility] Cardiologist:  Pixie Casino, MD  [facility] Electrophysiologist:  None   Referring MD: Shirline Frees, MD   Chief Complaint  Patient presents with  . Follow-up    LIGHT HEADED    History of Present Illness:    Glenda Evans is a 64 y.o. female with a hx of fibromyalgia, IBS, TIA, PFO s/p PFO closure, hypertension, hyperlipidemia and prediabetes.  Patient had a TIA in 2019 while visiting a family member at the local hospital.  She developed vision loss and collapse.  Afterward she had right-sided weakness and numbness.  Medical therapy was recommended with focus on CV risk reduction.  TEE performed on 01/04/2019 demonstrated normal EF, no significant valve disease, moderate PFO with associated atrial septal aneurysm, positive right-to-left shunt by agitated saline.  She eventually underwent successful PFO closure using a 18 mm Amplatzer PFO occluder device by Dr. Burt Knack on 02/15/2019.  Intraoperative echocardiogram obtained on the same day showed EF 55 to 60%, presence of occluder device without evidence of shunting.  Work-up for pheochromocytoma was negative in 2020.  She was placed on Cardizem for possible inappropriate sinus tachycardia.  Patient was most recently seen by Lattie Corns of valve clinic on 01/24/2020 at which time she was doing well.  Repeat echocardiogram obtained on the same day on 01/24/2020 showed EF 55 to 60%, grade 1 DD, no regional wall motion abnormality, no significant valve issue, saline bubble study was negative.  Patient had COVID-19 in November along with her husband.  Her husband unfortunately passed away from COVID-19.  Since her COVID-19, she has been having some chest discomfort with deep inspiration, body palpation and with exertion.  Symptom sounds more pulmonary breath than cardiac.  She also had  tachycardia with minimal exertion as well recently.  She is already on the highest dose of diltiazem, she went to the [facility] ED on 04/04/2020 and was prescribed 12.5 mg twice a day of metoprolol on top of the diltiazem.  Troponin and CT study were done in the emergency room.  She was discharged after 6 hours.  She did mention the ED physician said something about the ventricular tachycardia, however I do not have the record. (I question this diagnosis as patient says she was discharged after 6 hours and did not needed to be admitted to the hospital) With her reassuring echocardiogram and the atypical chest pain symptoms, I would not recommend any further work-up if her troponin is negative.  This is likely related to pulmonary inflammation as result of COVID-19 infection.    Past Medical History:  Diagnosis Date  . Allergy   . Anxiety   . Arthritis   . Asthma   . Cataract    right eye   . Depression   . Fatigue   . Fibromyalgia   . GERD (gastroesophageal reflux disease)   . Heart murmur   . HLD (hyperlipidemia)   . Hyperglycemia   . Hypertension   . Hypothyroidism   . IBS (irritable bowel syndrome)   . Insomnia   . Migraine   . Mitral valve prolapse   . Osteopenia   . Pre-diabetes   . PVC (premature ventricular contraction)   . Stein-Leventhal syndrome   . Wears glasses     Past Surgical History:  Procedure Laterality Date  . ABDOMINAL HYSTERECTOMY    .  BUBBLE STUDY  01/04/2019   Procedure: BUBBLE STUDY;  Surgeon: Pixie Casino, MD;  Location: [facility];  Service: Cardiovascular;;  . CESAREAN SECTION     x 2  . CHOLECYSTECTOMY    . COLONOSCOPY    . LIPOMA EXCISION  11/26/10   thigh x3 cyst  . OVARY SURGERY     to poke holes in ovaries for PCOD  . PATENT FORAMEN OVALE(PFO) CLOSURE N/A 02/15/2019   Procedure: PATENT FORAMEN OVALE (PFO) CLOSURE;  Surgeon: Sherren Mocha, MD;  Location: [facility];  Service: Cardiovascular;  Laterality: N/A;  . TEE WITHOUT  CARDIOVERSION N/A 01/04/2019   Procedure: TRANSESOPHAGEAL ECHOCARDIOGRAM (TEE);  Surgeon: Pixie Casino, MD;  Location: [facility];  Service: Cardiovascular;  Laterality: N/A;  . TONSILLECTOMY      Current Medications: No outpatient medications have been marked as taking for the 04/08/20 encounter (Office Visit) with Almyra Deforest, [place].   Current Facility-Administered Medications for the 04/08/20 encounter (Office Visit) with Almyra Deforest, PA  Medication  . triamcinolone acetonide (KENALOG) 10 MG/ML injection 10 mg     Allergies:   Augmentin [amoxicillin-pot clavulanate], Carvedilol, Erythromycin, Lyrica [pregabalin], Macrobid [nitrofurantoin macrocrystal], Metaxalone, Ofloxacin, Pamelor, Relafen [nabumetone], Septra [bactrim], Sulfasalazine, and Escitalopram oxalate   Social History   Socioeconomic History  . Marital status: Divorced    Spouse name: Not on file  . Number of children: 2  . Years of education: Not on file  . Highest education level: Not on file  Occupational History  . Occupation: disabled  Tobacco Use  . Smoking status: Never Smoker  . Smokeless tobacco: Never Used  Substance and Sexual Activity  . Alcohol use: No    Alcohol/week: 0.0 standard drinks  . Drug use: No  . Sexual activity: Not on file  Other Topics Concern  . Not on file  Social History Narrative  . Not on file   Social Determinants of Health   Financial Resource Strain: Not on file  Food Insecurity: Not on file  Transportation Needs: Not on file  Physical Activity: Not on file  Stress: Not on file  Social Connections: Not on file     Family History: The patient's family history includes Breast cancer in her maternal grandmother; COPD in her father, maternal aunt, and maternal grandmother; Colon polyps in her father, maternal aunt, maternal grandmother, mother, and sister; Diabetes in her maternal aunt, maternal grandmother, and sister; Heart disease in her maternal grandmother, mother, and  sister; Hypertension in her father; Ovarian cancer in her paternal aunt. There is no history of Colon cancer, Esophageal cancer, Rectal cancer, or Stomach cancer.  ROS:   Please see the history of present illness.     All other systems reviewed and are negative.  EKGs/Labs/Other Studies Reviewed:    The following studies were reviewed today:  Echo 02/15/2019 1. There is an interatrial septal occluder device present without  evidence of shunting.  2. Left ventricular ejection fraction, by visual estimation, is 55 to  60%. The left ventricle has normal function. There is no left ventricular  hypertrophy.  3. Global right ventricle has normal systolic function.The right  ventricular size is normal. No increase in right ventricular wall  thickness.  4. Left atrial size was normal.  5. Right atrial size was normal.  6. Presence of pericardial fat pad.  7. The pericardial effusion is circumferential.  8. Trivial pericardial effusion is present.  9. Mild mitral annular calcification.  10. The mitral valve is degenerative.  No evidence of mitral valve  regurgitation. No evidence of mitral stenosis.  11. The tricuspid valve is grossly normal. Tricuspid valve regurgitation  is not demonstrated.  12. Aortic valve regurgitation not assessed.  13. The aortic valve was not assessed. Aortic valve regurgitation not  assessed.  14. The pulmonic valve was not assessed. Pulmonic valve regurgitation not  assessed.  15. The aortic root was not well visualized.   EKG:  EKG is ordered today.  The ekg ordered today demonstrates normal sinus rhythm without significant ST-T wave changes.  Recent Labs: 05/24/2019: TSH 1.420 08/01/2019: BUN 22; Creatinine, Ser 1.01; Potassium 4.6; Sodium 138  Recent Lipid Panel    Component Value Date/Time   CHOL 199 07/10/2018 0943   TRIG 231 (H) 07/10/2018 0943   HDL 41 07/10/2018 0943   CHOLHDL 4.9 (H) 07/10/2018 0943   LDLCALC 112 (H) 07/10/2018 0943    LDLDIRECT 128 (H) 07/10/2018 0943     Risk Assessment/Calculations:       Physical Exam:    VS:  BP 102/80 (BP Location: Left Arm, Patient Position: Sitting, Cuff Size: Normal)   Pulse 82   Ht 5\' 5"  (1.651 m)   Wt 213 lb (96.6 kg)   BMI 35.45 kg/m     Wt Readings from Last 3 Encounters:  04/08/20 213 lb (96.6 kg)  01/24/20 225 lb (102.1 kg)  09/12/19 226 lb (102.5 kg)     GEN:  Well nourished, well developed in no acute distress HEENT: Normal NECK: No JVD; No carotid bruits LYMPHATICS: No lymphadenopathy CARDIAC: RRR, no murmurs, rubs, gallops RESPIRATORY:  Clear to auscultation without rales, wheezing or rhonchi  ABDOMEN: Soft, non-tender, non-distended MUSCULOSKELETAL:  No edema; No deformity  SKIN: Warm and dry NEUROLOGIC:  Alert and oriented x 3 PSYCHIATRIC:  Normal affect   ASSESSMENT:    1. Chest pain, unspecified type   2. Tachycardia   3. PFO (patent foramen ovale)   4. Primary hypertension   5. Hyperlipidemia LDL goal <70   6. Prediabetes    PLAN:    In order of problems listed above:  1. Chest pain: Symptom of chest pain occurred after COVID-19 infection.  Chest discomfort is exacerbated with deep inspiration and cough.  At this time I do not recommend further work-up.  Unfortunately her husband passed away last month due to COVID-19 infection.  We will request the record from recent half ED visit.  She mentioned that the ED physician mentioned ventricular tachycardia however did not recommend that she get admitted.  I questioned this diagnosis.  2. Tachycardia: Also noted after COVID-19 infection.  She went to [facility] ED last Friday, low-dose metoprolol was added to her diltiazem.  Heart rate is reasonable at this time  3. PFO closure: Stable on last echocardiogram  4. Hypertension: Blood pressure stable  5. Hyperlipidemia: On Lipitor  6. Prediabetes: Followed by primary care provider       Medication Adjustments/Labs and Tests  Ordered: Current medicines are reviewed at length with the patient today.  Concerns regarding medicines are outlined above.  Orders Placed This Encounter  Procedures  . EKG 12-Lead   No orders of the defined types were placed in this encounter.   Patient Instructions  Medication Instructions:  Your physician recommends that you continue on your current medications as directed. Please refer to the Current Medication list given to you today.  *If you need a refill on your cardiac medications before your next appointment, please call your pharmacy*  Lab Work: NONE ordered at this time of appointment   If you have labs (blood work) drawn today and your tests are completely normal, you will receive your results only by: Marland Kitchen MyChart Message (if you have MyChart) OR . A paper copy in the mail If you have any lab test that is abnormal or we need to change your treatment, we will call you to review the results.  Testing/Procedures: NONE ordered at this time of appointment   Follow-Up: At [facility], you and your health needs are our priority.  As part of our continuing mission to provide you with exceptional heart care, we have created designated Provider Care Teams.  These Care Teams include your primary Cardiologist (physician) and Advanced Practice Providers (APPs -  Physician Assistants and Nurse Practitioners) who all work together to provide you with the care you need, when you need it.  Your next appointment:    Follow up as scheduled   The format for your next appointment:   In Person  Provider:   K. Mali Hilty, MD  Other Instructions      Signed, Almyra Deforest, [place]  04/10/2020 9:46 PM    [place]

## 2020-04-08 NOTE — Patient Instructions (Addendum)
Medication Instructions:  Your physician recommends that you continue on your current medications as directed. Please refer to the Current Medication list given to you today.  *If you need a refill on your cardiac medications before your next appointment, please call your pharmacy*  Lab Work: NONE ordered at this time of appointment   If you have labs (blood work) drawn today and your tests are completely normal, you will receive your results only by: . MyChart Message (if you have MyChart) OR . A paper copy in the mail If you have any lab test that is abnormal or we need to change your treatment, we will call you to review the results.  Testing/Procedures: NONE ordered at this time of appointment   Follow-Up: At [facility], you and your health needs are our priority.  As part of our continuing mission to provide you with exceptional heart care, we have created designated Provider Care Teams.  These Care Teams include your primary Cardiologist (physician) and Advanced Practice Providers (APPs -  Physician Assistants and Nurse Practitioners) who all work together to provide you with the care you need, when you need it.   Your next appointment:   Follow up as scheduled   The format for your next appointment:   In Person  Provider:   K. Chad Hilty, MD   Other Instructions   

## 2020-04-10 ENCOUNTER — Encounter: Payer: Self-pay | Admitting: Physician Assistant

## 2020-04-14 DIAGNOSIS — Q211 Atrial septal defect: Secondary | ICD-10-CM | POA: Diagnosis not present

## 2020-04-14 DIAGNOSIS — K219 Gastro-esophageal reflux disease without esophagitis: Secondary | ICD-10-CM | POA: Diagnosis not present

## 2020-04-14 DIAGNOSIS — I10 Essential (primary) hypertension: Secondary | ICD-10-CM | POA: Diagnosis not present

## 2020-04-14 DIAGNOSIS — M797 Fibromyalgia: Secondary | ICD-10-CM | POA: Diagnosis not present

## 2020-04-17 DIAGNOSIS — K760 Fatty (change of) liver, not elsewhere classified: Secondary | ICD-10-CM | POA: Diagnosis not present

## 2020-04-22 ENCOUNTER — Other Ambulatory Visit: Payer: Self-pay | Admitting: Cardiovascular Disease

## 2020-05-06 DIAGNOSIS — R5383 Other fatigue: Secondary | ICD-10-CM | POA: Diagnosis not present

## 2020-05-06 DIAGNOSIS — G4733 Obstructive sleep apnea (adult) (pediatric): Secondary | ICD-10-CM | POA: Diagnosis not present

## 2020-05-06 DIAGNOSIS — Z8616 Personal history of COVID-19: Secondary | ICD-10-CM | POA: Diagnosis not present

## 2020-05-06 DIAGNOSIS — J4530 Mild persistent asthma, uncomplicated: Secondary | ICD-10-CM | POA: Diagnosis not present

## 2020-05-25 ENCOUNTER — Other Ambulatory Visit: Payer: Self-pay | Admitting: Internal Medicine

## 2020-05-29 DIAGNOSIS — J4520 Mild intermittent asthma, uncomplicated: Secondary | ICD-10-CM | POA: Diagnosis not present

## 2020-05-29 DIAGNOSIS — E119 Type 2 diabetes mellitus without complications: Secondary | ICD-10-CM | POA: Diagnosis not present

## 2020-05-29 DIAGNOSIS — E7800 Pure hypercholesterolemia, unspecified: Secondary | ICD-10-CM | POA: Diagnosis not present

## 2020-05-29 DIAGNOSIS — F324 Major depressive disorder, single episode, in partial remission: Secondary | ICD-10-CM | POA: Diagnosis not present

## 2020-05-29 DIAGNOSIS — F419 Anxiety disorder, unspecified: Secondary | ICD-10-CM | POA: Diagnosis not present

## 2020-05-29 DIAGNOSIS — I10 Essential (primary) hypertension: Secondary | ICD-10-CM | POA: Diagnosis not present

## 2020-05-29 DIAGNOSIS — K219 Gastro-esophageal reflux disease without esophagitis: Secondary | ICD-10-CM | POA: Diagnosis not present

## 2020-05-29 DIAGNOSIS — E039 Hypothyroidism, unspecified: Secondary | ICD-10-CM | POA: Diagnosis not present

## 2020-05-29 DIAGNOSIS — M797 Fibromyalgia: Secondary | ICD-10-CM | POA: Diagnosis not present

## 2020-06-13 ENCOUNTER — Other Ambulatory Visit: Payer: Self-pay | Admitting: Cardiovascular Disease

## 2020-06-16 ENCOUNTER — Ambulatory Visit (INDEPENDENT_AMBULATORY_CARE_PROVIDER_SITE_OTHER): Payer: Medicare Other | Admitting: Internal Medicine

## 2020-06-16 ENCOUNTER — Encounter: Payer: Self-pay | Admitting: Internal Medicine

## 2020-06-16 ENCOUNTER — Other Ambulatory Visit: Payer: Self-pay

## 2020-06-16 VITALS — BP 122/77 | HR 79 | Ht 65.0 in | Wt 216.8 lb

## 2020-06-16 DIAGNOSIS — I10 Essential (primary) hypertension: Secondary | ICD-10-CM | POA: Diagnosis not present

## 2020-06-16 DIAGNOSIS — Z8774 Personal history of (corrected) congenital malformations of heart and circulatory system: Secondary | ICD-10-CM

## 2020-06-16 DIAGNOSIS — E785 Hyperlipidemia, unspecified: Secondary | ICD-10-CM | POA: Diagnosis not present

## 2020-06-16 NOTE — Progress Notes (Signed)
OFFICE NOTE  Chief Complaint:  Follow-up  Primary Care Physician: Shirline Frees, MD  HPI:  Glenda Evans is a 65 y.o. female with a past medical history significant for some anxiety, fibromyalgia, hypertension, palpitations and hypothyroidism. She initially had evaluation of her palpitations in the early 2000's by Dr. Pernell Dupre. At the time she wore monitor, had a stress test and echocardiogram. The echocardiogram suggested mitral valve prolapse. Her monitor failed to show clear etiology of her symptoms. She does have a history of PVCs on her chart however those were not noted by EKG today. Recently she said she's had some increasing burden of palpitations. She denies any worsening stress. She also has sharp chest discomfort. She feels like this is different than her fibromyalgia, however it was reproduced during auscultation with my stethoscope today. She's had close to 20 pound weight gain over the past several months due to some orthopedic problems in her feet which she says is decreased her exercise. This certainly could explain some of her shortness of breath. She feels like the episodes of palpitations that occur very infrequently are paroxysmal. Oftentimes include heart racing and then can be followed by feelings of significant fatigue which is worse than her daily chronic fatigue.  09/17/2015  Glenda Evans returns today for follow-up. She wore a monitor which demonstrated PACs and has had some PVCs. Her echocardiogram showed normal systolic function with mild diastolic dysfunction. There was no evidence of mitral valve prolapse that was diagnostically significant. We talked about possible medication changes to try to help with her palpitations. She does note that she has significant side effects from verapamil including possible constipation.   12/26/2015  Glenda Evans returns today for follow-up. She reports marked improvement in her palpitations with adjustment in her medications.  Blood pressure is excellent today at 120/80. She denies any chest pain or worsening shortness of breath. She continues to be fatigued. Sleep is poor at night and generally she is quite awake. Occasionally she gas for breath but does not report heavy snoring. She naps a lot during the day.  05/14/2016  Glenda Evans reports improvement in her palpitations on diltiazem and she is pleased with good blood pressure control. She continues to struggle with daytime somnolence and insomnia at night. She manages only to get a couple hours of sleep at night but generally can sleep 6-8 hours during the day. He feels chronically fatigued. She underwent a sleep study which was negative for obstructive sleep apnea however might of been concern for either restless leg syndrome which she vehemently denies or perhaps narcolepsy disorder. Dr. Claiborne Billings ordered and an MSLT test. This is pending. She does use clonazepam to try to help with sleep and anxiety at night but she says is rarely effective. Her main issues not being able to turn her brain off at night.  12/16/2016  Glenda Evans was seen today in follow-up. Recently she's been concerned about leg pain and swelling. She did have a sleep study which was negative for sleep apnea. She reports her palpitations have improved on diltiazem. She does carry a diagnosis of fibromyalgia may have chronic fatigue syndrome. In addition she has some degree of neuropathy but has been intolerant to neuropathic pain medicines. She reports shooting pain down both legs. Seems to be worse in the right. There is some associated swelling. It's worse when she walks and gets better when she rests. She reports some cold feet. She's concerned as her husband recently had popliteal aneurysms  and ultimately had to have amputation of one of his legs. She's also concerned about same prominent veins as it may be related to varicose veins. She wonders if she may have had a blood clot.  06/22/2018  Glenda Evans  is seen today in routine follow-up.  Unfortunately recently she had a stroke when she was visiting a family member [facility].  She was under significant stress and ended up being hospitalized she has had for 2 weeks.  She underwent various imaging studies and was felt that her symptoms were due to stress.  She does have a history of elevated cholesterol and most recently her lipid profile in February 2019 did show a direct LDL 69 and triglycerides of 450.  She had previously been on WelChol but was not on statin therapy.  She did see Dr. Leonie Man with [facility] neurology who recommended her starting on atorvastatin 20 mg daily.  Currently however she is not on that medication has not been on it for several months because she was not sure why she was taking it.  Her last lipid profile was in March 2019 again showed a total cholesterol 184, HDL 41, LDL 91 and triglycerides of 260.  Blood pressure today was 132/80.  She has managed to lose about 8 pounds and is working on dietary modification.  Recently, she was also diagnosed with type 2 diabetes and her hemoglobin A1c was over 6.8.  She started on metformin which has helped her sugars and also surprisingly helped symptoms of leg pain which she had.  As part of her work-up she did have MRI/MRA.  This did demonstrate a bovine configuration of her aortic arch but no evidence of carotid artery disease, stenosis, aneurysm or vascular occlusion.  Surprisingly, I did locate an echocardiogram performed on 225/2019, which shows an LVEF of 50 to 55%.  If one looks really closely a bubble study was performed and under the right atrial tab it does indicate there was a right to left interatrial shunt after injection of agitated saline contrast.  This suggest that a PFO is noted.  I was able to locate the neurology consult note which indicated positive PFO but negative Dopplers for DVT.  Transcranial Doppler indicated normal intracranial velocities.  There was no suggestion of  pursuing the PFO as a cause of TIA, since LE venous dopplers were performed and were negative for DVT.  12/06/2018  Glenda Evans is seen today in follow-up.  She was supposed to undergo TEE to evaluate for PFO however with COVID this was delayed.  She indicates that she is interested in pursuing this.  Unfortunately she has not been compliant with aspirin.  I asked her to restart that today.  Otherwise she is asymptomatic.  She denies any chest pain or worsening shortness of breath.  She has had no further TIAs but does get some occasional dizzy episodes which she thinks could be related.  EKG is normal today.  Blood pressure is normal as well.  07/25/2019  Glenda Evans is seen today for follow-up.  She underwent successful closure of her PFO and there was no residual shunting.  Unfortunately, she subsequently developed some inappropriate tachycardia and flushing.  She was seen by our pharmacist Marcelle Overlie, who uptitrated her calcium channel blocker and sent a number of labs including work-up for pheochromocytoma.  Not surprisingly this was negative.  Over a short period of time her heart rate has come down.  She is also been struggling with possible shingles or  some type of neuropathic pain in her upper chest that started under her right armpit and extends under the right breast.  She has been on Valtrex and gabapentin without much relief.   06/16/2020  Glenda Evans returns today for follow-up.  Unfortunately she has had a very difficult past several months.  In the fall she contracted COVID-19.  She ended up getting antibody therapies for it.  She was unvaccinated.  Her husband, who is a veteran was also apparently unvaccinated and had previously approached his PCP who did not encourage the vaccine.  Subsequently he contracted COVID-19 and unfortunately died 10 days later of the virus.  She struggled as well and had been hospitalized 3 different times with symptoms related to that.  More recently she  has been placed on a Breo inhaler and finally has improvement in her symptoms.  She says she is just about back to baseline at this point.  PMHx:  Past Medical History:  Diagnosis Date  . Allergy   . Anxiety   . Arthritis   . Asthma   . Cataract    right eye   . Depression   . Fatigue   . Fibromyalgia   . GERD (gastroesophageal reflux disease)   . Heart murmur   . HLD (hyperlipidemia)   . Hyperglycemia   . Hypertension   . Hypothyroidism   . IBS (irritable bowel syndrome)   . Insomnia   . Migraine   . Mitral valve prolapse   . Osteopenia   . Pre-diabetes   . PVC (premature ventricular contraction)   . Stein-Leventhal syndrome   . Wears glasses     Past Surgical History:  Procedure Laterality Date  . ABDOMINAL HYSTERECTOMY    . BUBBLE STUDY  01/04/2019   Procedure: BUBBLE STUDY;  Surgeon: Pixie Casino, MD;  Location: [facility];  Service: Cardiovascular;;  . CESAREAN SECTION     x 2  . CHOLECYSTECTOMY    . COLONOSCOPY    . LIPOMA EXCISION  11/26/10   thigh x3 cyst  . OVARY SURGERY     to poke holes in ovaries for PCOD  . PATENT FORAMEN OVALE(PFO) CLOSURE N/A 02/15/2019   Procedure: PATENT FORAMEN OVALE (PFO) CLOSURE;  Surgeon: Sherren Mocha, MD;  Location: [facility];  Service: Cardiovascular;  Laterality: N/A;  . TEE WITHOUT CARDIOVERSION N/A 01/04/2019   Procedure: TRANSESOPHAGEAL ECHOCARDIOGRAM (TEE);  Surgeon: Pixie Casino, MD;  Location: [facility] ENDOSCOPY;  Service: Cardiovascular;  Laterality: N/A;  . TONSILLECTOMY      FAMHx:  Family History  Problem Relation Age of Onset  . COPD Father   . Hypertension Father   . Colon polyps Father   . Diabetes Sister   . Colon polyps Mother   . Heart disease Mother        MVP  . Breast cancer Maternal Grandmother   . Colon polyps Maternal Grandmother   . Diabetes Maternal Grandmother   . Heart disease Maternal Grandmother   . COPD Maternal Grandmother   . Ovarian cancer Paternal Aunt   . Colon  polyps Sister   . Colon polyps Maternal Aunt        x 2  . Diabetes Maternal Aunt   . Heart disease Sister        MVP  . COPD Maternal Aunt   . Colon cancer Neg Hx   . Esophageal cancer Neg Hx   . Rectal cancer Neg Hx   . Stomach cancer Neg Hx  SOCHx:   reports that she has never smoked. She has never used smokeless tobacco. She reports that she does not drink alcohol and does not use drugs.  ALLERGIES:  Allergies  Allergen Reactions  . Augmentin [Amoxicillin-Pot Clavulanate] Shortness Of Breath and Swelling    Did it involve swelling of the face/tongue/throat, SOB, or low BP? Yes Did it involve sudden or severe rash/hives, skin peeling, or any reaction on the inside of your mouth or nose? No Did you need to seek medical attention at a hospital or doctor's office? No When did it last happen?10 years ago If all above answers are "NO", may proceed with cephalosporin use.   . Carvedilol Shortness Of Breath    Felt like she had cotton in her throat- felt like it was hard to breath  . Erythromycin Shortness Of Breath and Swelling  . Lyrica [Pregabalin] Shortness Of Breath and Swelling  . Macrobid [Nitrofurantoin Macrocrystal] Swelling  . Metaxalone Shortness Of Breath  . Ofloxacin Shortness Of Breath and Swelling  . Pamelor Shortness Of Breath and Swelling  . Relafen [Nabumetone] Shortness Of Breath and Swelling  . Septra [Bactrim] Shortness Of Breath and Swelling  . Sulfasalazine Shortness Of Breath  . Escitalopram Oxalate Other (See Comments)    Unsure of exact reaction type    ROS: Pertinent items noted in HPI and remainder of comprehensive ROS otherwise negative.  HOME MEDS: Current Outpatient Medications  Medication Sig Dispense Refill  . Accu-Chek FastClix Lancets MISC FOR USE WHEN CHECKING BLOODSUGARS AS DIRECTED ONCE DAILY DX E11.9 102 DAYS    . ACCU-CHEK GUIDE test strip USE AS DIRECTED ONCE A DAY    . albuterol (PROVENTIL HFA;VENTOLIN HFA) 108 (90 BASE)  MCG/ACT inhaler Inhale 2 puffs into the lungs every 6 (six) hours as needed for wheezing or shortness of breath.     Marland Kitchen aspirin EC 81 MG tablet Take 81 mg by mouth daily.    Marland Kitchen atorvastatin (LIPITOR) 40 MG tablet TAKE 1 TABLET (40 MG TOTAL) BY MOUTH DAILY AT 6 PM. 90 tablet 3  . Black Cohosh 40 MG CAPS Take 40 mg by mouth daily.     . clonazePAM (KLONOPIN) 0.5 MG tablet Take 0.5 mg by mouth at bedtime. take a 0.5 mg dose during the day as needed for anxiety  0  . diltiazem (CARDIZEM CD) 360 MG 24 hr capsule TAKE 1 CAPSULE BY MOUTH EVERY DAY 90 capsule 3  . DULoxetine (CYMBALTA) 60 MG capsule Take 120 mg by mouth daily.   3  . levothyroxine (SYNTHROID, LEVOTHROID) 125 MCG tablet Take 150 mcg by mouth daily before breakfast. TOTAL 150 MG    . loratadine (CLARITIN) 10 MG tablet Take 10 mg by mouth daily as needed for allergies.     Marland Kitchen losartan (COZAAR) 100 MG tablet TAKE 1 TABLET BY MOUTH EVERY DAY 90 tablet 1  . metFORMIN (GLUCOPHAGE-XR) 500 MG 24 hr tablet Take 500 mg by mouth daily.     . metoprolol tartrate (LOPRESSOR) 25 MG tablet Take 12.5 mg by mouth 2 (two) times daily.    . Multiple Vitamin (MULTIVITAMIN WITH MINERALS) TABS tablet Take 1 tablet by mouth daily. Vita Fusion    . naproxen sodium (ALEVE) 220 MG tablet Take 440 mg by mouth daily as needed (migraines).    . Nutritional Supplements (ESTROVEN PO) Take 1 tablet by mouth daily.     . Potassium Chloride ER 20 MEQ TBCR TAKE 1 TABLET BY MOUTH EVERY DAY 90 tablet 3  .  Vitamin D, Ergocalciferol, (DRISDOL) 1.25 MG (50000 UNIT) CAPS capsule Take 50,000 Units by mouth once a week.     Current Facility-Administered Medications  Medication Dose Route Frequency Provider Last Rate Last Admin  . triamcinolone acetonide (KENALOG) 10 MG/ML injection 10 mg  10 mg Other Once Landis Martins, DPM        LABS/IMAGING: No results found for this or any previous visit (from the past 48 hour(s)). No results found.  WEIGHTS: Wt Readings from Last 3  Encounters:  06/16/20 216 lb 12.8 oz (98.3 kg)  04/08/20 213 lb (96.6 kg)  01/24/20 225 lb (102.1 kg)    VITALS: BP 122/77   Pulse 79   Ht 5\' 5"  (1.651 m)   Wt 216 lb 12.8 oz (98.3 kg)   SpO2 95%   BMI 36.08 kg/m   EXAM: General appearance: alert and no distress Neck: no carotid bruit and no JVD Lungs: clear to auscultation bilaterally Heart: regular rate and rhythm, S1, S2 normal, no murmur, click, rub or gallop Abdomen: soft, non-tender; bowel sounds normal; no masses,  no organomegaly Extremities: extremities normal, atraumatic, no cyanosis or edema and varicose veins noted Pulses: 2+ and symmetric Skin: Skin color, texture, turgor normal. No rashes or lesions Neurologic: Grossly normal Psych: Moderately anxious  EKG: Normal sinus rhythm at 79-personally reviewed  ASSESSMENT: 1. Inappropriate tachycardia 2. Recurrent TIA without imaging evidence of stroke 3. PFO with right to left shunt 4. Low normal LVEF 50 to 55% (2019) 5. Bovine aortic arch 6. Bilateral leg pain and swelling 7. Palpitations - PAC's and PVC's (improved on diltiazem) 8. Dyspnea on exertion - normal systolic function and mild diastolic dysfunction 9. Essential hypertension 10. Atypical chest wall pain-likely related to fibromyalgia 11. Normal sinus rhythm at 79, left axis deviation, incomplete right bundle branch block-personally reviewed nxiety 12. Hypersomnolence  PLAN: 1.   Glenda Evans had recent COVID-19 pneumonia in the fall and unfortunately had recurrent hospitalizations related to that but is now improving.  She has had no further issues with palpitations or TIA/stroke symptoms.  She is struggling emotionally as expected with the loss of her husband.  Blood pressures well controlled today.  No medications were changed  Follow-up with me in 6 months.  Pixie Casino, MD, [facility], [place] Director of the Advanced Lipid Disorders &  Cardiovascular Risk  Reduction Clinic Diplomate of the American Board of Clinical Lipidology Attending Cardiologist  Direct Dial: [phone]  Fax: [phone]  Website:  [email or web address] 06/16/2020, 3:10 PM

## 2020-06-16 NOTE — Patient Instructions (Signed)
Medication Instructions:  Your physician recommends that you continue on your current medications as directed. Please refer to the Current Medication list given to you today.  *If you need a refill on your cardiac medications before your next appointment, please call your pharmacy*   Lab Work: FASTING lipid panel to check cholesterol in 6 months  If you have labs (blood work) drawn today and your tests are completely normal, you will receive your results only by: Marland Kitchen MyChart Message (if you have MyChart) OR . A paper copy in the mail If you have any lab test that is abnormal or we need to change your treatment, we will call you to review the results.   Testing/Procedures: NONE   Follow-Up: At [facility], you and your health needs are our priority.  As part of our continuing mission to provide you with exceptional heart care, we have created designated Provider Care Teams.  These Care Teams include your primary Cardiologist (physician) and Advanced Practice Providers (APPs -  Physician Assistants and Nurse Practitioners) who all work together to provide you with the care you need, when you need it.  We recommend signing up for the patient portal called "MyChart".  Sign up information is provided on this After Visit Summary.  MyChart is used to connect with patients for Virtual Visits (Telemedicine).  Patients are able to view lab/test results, encounter notes, upcoming appointments, etc.  Non-urgent messages can be sent to your provider as well.   To learn more about what you can do with MyChart, go to [email or web address].    Your next appointment:   6 month(s)  The format for your next appointment:   In Person  Provider:   You may see Pixie Casino, MD or one of the following Advanced Practice Providers on your designated Care Team:    Almyra Deforest, PA-C  Fabian Sharp, PA-C or   Roby Lofts, [place]    Other Instructions

## 2020-06-30 DIAGNOSIS — R5383 Other fatigue: Secondary | ICD-10-CM | POA: Diagnosis not present

## 2020-06-30 DIAGNOSIS — J4530 Mild persistent asthma, uncomplicated: Secondary | ICD-10-CM | POA: Diagnosis not present

## 2020-06-30 DIAGNOSIS — G4733 Obstructive sleep apnea (adult) (pediatric): Secondary | ICD-10-CM | POA: Diagnosis not present

## 2020-06-30 DIAGNOSIS — Z8616 Personal history of COVID-19: Secondary | ICD-10-CM | POA: Diagnosis not present

## 2020-07-28 DIAGNOSIS — H01119 Allergic dermatitis of unspecified eye, unspecified eyelid: Secondary | ICD-10-CM | POA: Diagnosis not present

## 2020-08-12 DIAGNOSIS — H353131 Nonexudative age-related macular degeneration, bilateral, early dry stage: Secondary | ICD-10-CM | POA: Diagnosis not present

## 2020-08-12 DIAGNOSIS — H4311 Vitreous hemorrhage, right eye: Secondary | ICD-10-CM | POA: Diagnosis not present

## 2020-08-12 DIAGNOSIS — H43812 Vitreous degeneration, left eye: Secondary | ICD-10-CM | POA: Diagnosis not present

## 2020-08-12 DIAGNOSIS — E119 Type 2 diabetes mellitus without complications: Secondary | ICD-10-CM | POA: Diagnosis not present

## 2020-08-12 DIAGNOSIS — H25813 Combined forms of age-related cataract, bilateral: Secondary | ICD-10-CM | POA: Diagnosis not present

## 2020-08-12 DIAGNOSIS — H43811 Vitreous degeneration, right eye: Secondary | ICD-10-CM | POA: Diagnosis not present

## 2020-08-12 DIAGNOSIS — D3131 Benign neoplasm of right choroid: Secondary | ICD-10-CM | POA: Diagnosis not present

## 2020-09-08 DIAGNOSIS — H353132 Nonexudative age-related macular degeneration, bilateral, intermediate dry stage: Secondary | ICD-10-CM | POA: Diagnosis not present

## 2020-09-08 DIAGNOSIS — H35033 Hypertensive retinopathy, bilateral: Secondary | ICD-10-CM | POA: Diagnosis not present

## 2020-09-08 DIAGNOSIS — H43811 Vitreous degeneration, right eye: Secondary | ICD-10-CM | POA: Diagnosis not present

## 2020-09-08 DIAGNOSIS — H43822 Vitreomacular adhesion, left eye: Secondary | ICD-10-CM | POA: Diagnosis not present

## 2020-09-15 ENCOUNTER — Other Ambulatory Visit: Payer: Self-pay | Admitting: Physician Assistant

## 2020-09-15 DIAGNOSIS — Q2112 Patent foramen ovale: Secondary | ICD-10-CM

## 2020-09-15 DIAGNOSIS — Q211 Atrial septal defect: Secondary | ICD-10-CM

## 2020-09-15 DIAGNOSIS — Z8774 Personal history of (corrected) congenital malformations of heart and circulatory system: Secondary | ICD-10-CM

## 2020-09-22 DIAGNOSIS — R102 Pelvic and perineal pain: Secondary | ICD-10-CM | POA: Diagnosis not present

## 2020-09-22 DIAGNOSIS — N898 Other specified noninflammatory disorders of vagina: Secondary | ICD-10-CM | POA: Diagnosis not present

## 2020-09-22 DIAGNOSIS — R39198 Other difficulties with micturition: Secondary | ICD-10-CM | POA: Diagnosis not present

## 2020-09-23 DIAGNOSIS — E119 Type 2 diabetes mellitus without complications: Secondary | ICD-10-CM | POA: Diagnosis not present

## 2020-09-23 DIAGNOSIS — J4530 Mild persistent asthma, uncomplicated: Secondary | ICD-10-CM | POA: Diagnosis not present

## 2020-09-23 DIAGNOSIS — M797 Fibromyalgia: Secondary | ICD-10-CM | POA: Diagnosis not present

## 2020-09-23 DIAGNOSIS — R5383 Other fatigue: Secondary | ICD-10-CM | POA: Diagnosis not present

## 2020-09-23 DIAGNOSIS — I10 Essential (primary) hypertension: Secondary | ICD-10-CM | POA: Diagnosis not present

## 2020-09-23 DIAGNOSIS — E039 Hypothyroidism, unspecified: Secondary | ICD-10-CM | POA: Diagnosis not present

## 2020-09-23 DIAGNOSIS — E559 Vitamin D deficiency, unspecified: Secondary | ICD-10-CM | POA: Diagnosis not present

## 2020-09-23 DIAGNOSIS — J4520 Mild intermittent asthma, uncomplicated: Secondary | ICD-10-CM | POA: Diagnosis not present

## 2020-09-23 DIAGNOSIS — E7800 Pure hypercholesterolemia, unspecified: Secondary | ICD-10-CM | POA: Diagnosis not present

## 2020-09-23 DIAGNOSIS — F324 Major depressive disorder, single episode, in partial remission: Secondary | ICD-10-CM | POA: Diagnosis not present

## 2020-09-23 DIAGNOSIS — F419 Anxiety disorder, unspecified: Secondary | ICD-10-CM | POA: Diagnosis not present

## 2020-09-23 DIAGNOSIS — Z8616 Personal history of COVID-19: Secondary | ICD-10-CM | POA: Diagnosis not present

## 2020-09-23 DIAGNOSIS — G4733 Obstructive sleep apnea (adult) (pediatric): Secondary | ICD-10-CM | POA: Diagnosis not present

## 2020-09-26 DIAGNOSIS — Z23 Encounter for immunization: Secondary | ICD-10-CM | POA: Diagnosis not present

## 2020-10-27 DIAGNOSIS — D1801 Hemangioma of skin and subcutaneous tissue: Secondary | ICD-10-CM | POA: Diagnosis not present

## 2020-10-27 DIAGNOSIS — D485 Neoplasm of uncertain behavior of skin: Secondary | ICD-10-CM | POA: Diagnosis not present

## 2020-10-27 DIAGNOSIS — D224 Melanocytic nevi of scalp and neck: Secondary | ICD-10-CM | POA: Diagnosis not present

## 2020-10-27 DIAGNOSIS — D2272 Melanocytic nevi of left lower limb, including hip: Secondary | ICD-10-CM | POA: Diagnosis not present

## 2020-10-27 DIAGNOSIS — D225 Melanocytic nevi of trunk: Secondary | ICD-10-CM | POA: Diagnosis not present

## 2020-10-27 DIAGNOSIS — L820 Inflamed seborrheic keratosis: Secondary | ICD-10-CM | POA: Diagnosis not present

## 2020-10-27 DIAGNOSIS — D2271 Melanocytic nevi of right lower limb, including hip: Secondary | ICD-10-CM | POA: Diagnosis not present

## 2020-10-27 DIAGNOSIS — L738 Other specified follicular disorders: Secondary | ICD-10-CM | POA: Diagnosis not present

## 2020-10-27 DIAGNOSIS — L821 Other seborrheic keratosis: Secondary | ICD-10-CM | POA: Diagnosis not present

## 2020-11-09 DIAGNOSIS — R911 Solitary pulmonary nodule: Secondary | ICD-10-CM | POA: Diagnosis not present

## 2020-11-09 DIAGNOSIS — U071 COVID-19: Secondary | ICD-10-CM | POA: Diagnosis not present

## 2020-11-09 DIAGNOSIS — Z8673 Personal history of transient ischemic attack (TIA), and cerebral infarction without residual deficits: Secondary | ICD-10-CM | POA: Diagnosis not present

## 2020-11-09 DIAGNOSIS — J449 Chronic obstructive pulmonary disease, unspecified: Secondary | ICD-10-CM | POA: Diagnosis not present

## 2020-11-09 DIAGNOSIS — I10 Essential (primary) hypertension: Secondary | ICD-10-CM | POA: Diagnosis not present

## 2020-11-09 DIAGNOSIS — Z7982 Long term (current) use of aspirin: Secondary | ICD-10-CM | POA: Diagnosis not present

## 2020-11-09 DIAGNOSIS — E119 Type 2 diabetes mellitus without complications: Secondary | ICD-10-CM | POA: Diagnosis not present

## 2020-11-09 DIAGNOSIS — Z79899 Other long term (current) drug therapy: Secondary | ICD-10-CM | POA: Diagnosis not present

## 2020-11-09 DIAGNOSIS — R0600 Dyspnea, unspecified: Secondary | ICD-10-CM | POA: Diagnosis not present

## 2020-11-09 DIAGNOSIS — J40 Bronchitis, not specified as acute or chronic: Secondary | ICD-10-CM | POA: Diagnosis not present

## 2020-11-18 DIAGNOSIS — Z9189 Other specified personal risk factors, not elsewhere classified: Secondary | ICD-10-CM | POA: Diagnosis not present

## 2020-11-18 DIAGNOSIS — Z7251 High risk heterosexual behavior: Secondary | ICD-10-CM | POA: Diagnosis not present

## 2020-11-18 DIAGNOSIS — N895 Stricture and atresia of vagina: Secondary | ICD-10-CM | POA: Diagnosis not present

## 2020-11-18 DIAGNOSIS — N952 Postmenopausal atrophic vaginitis: Secondary | ICD-10-CM | POA: Diagnosis not present

## 2020-11-26 DIAGNOSIS — B373 Candidiasis of vulva and vagina: Secondary | ICD-10-CM | POA: Diagnosis not present

## 2020-11-26 DIAGNOSIS — E119 Type 2 diabetes mellitus without complications: Secondary | ICD-10-CM | POA: Diagnosis not present

## 2020-11-26 DIAGNOSIS — F324 Major depressive disorder, single episode, in partial remission: Secondary | ICD-10-CM | POA: Diagnosis not present

## 2020-11-26 DIAGNOSIS — I10 Essential (primary) hypertension: Secondary | ICD-10-CM | POA: Diagnosis not present

## 2020-11-26 DIAGNOSIS — J4520 Mild intermittent asthma, uncomplicated: Secondary | ICD-10-CM | POA: Diagnosis not present

## 2020-11-26 DIAGNOSIS — M797 Fibromyalgia: Secondary | ICD-10-CM | POA: Diagnosis not present

## 2020-11-26 DIAGNOSIS — E7800 Pure hypercholesterolemia, unspecified: Secondary | ICD-10-CM | POA: Diagnosis not present

## 2020-11-26 DIAGNOSIS — E039 Hypothyroidism, unspecified: Secondary | ICD-10-CM | POA: Diagnosis not present

## 2021-01-06 DIAGNOSIS — H43811 Vitreous degeneration, right eye: Secondary | ICD-10-CM | POA: Diagnosis not present

## 2021-01-06 DIAGNOSIS — H43822 Vitreomacular adhesion, left eye: Secondary | ICD-10-CM | POA: Diagnosis not present

## 2021-01-06 DIAGNOSIS — H353132 Nonexudative age-related macular degeneration, bilateral, intermediate dry stage: Secondary | ICD-10-CM | POA: Diagnosis not present

## 2021-01-06 DIAGNOSIS — H35033 Hypertensive retinopathy, bilateral: Secondary | ICD-10-CM | POA: Diagnosis not present

## 2021-01-23 ENCOUNTER — Other Ambulatory Visit: Payer: Self-pay | Admitting: Obstetrics and Gynecology

## 2021-01-23 DIAGNOSIS — Z1231 Encounter for screening mammogram for malignant neoplasm of breast: Secondary | ICD-10-CM

## 2021-02-27 ENCOUNTER — Other Ambulatory Visit: Payer: Self-pay

## 2021-02-27 ENCOUNTER — Ambulatory Visit
Admission: RE | Admit: 2021-02-27 | Discharge: 2021-02-27 | Disposition: A | Discharge status: Home / Self Care | Payer: Medicare Other | Source: Ambulatory Visit | Attending: Obstetrics and Gynecology | Admitting: Obstetrics and Gynecology

## 2021-02-27 DIAGNOSIS — Z1231 Encounter for screening mammogram for malignant neoplasm of breast: Secondary | ICD-10-CM

## 2021-03-20 DIAGNOSIS — N3000 Acute cystitis without hematuria: Secondary | ICD-10-CM | POA: Diagnosis not present

## 2021-03-20 DIAGNOSIS — R300 Dysuria: Secondary | ICD-10-CM | POA: Diagnosis not present

## 2021-03-20 DIAGNOSIS — M5410 Radiculopathy, site unspecified: Secondary | ICD-10-CM | POA: Diagnosis not present

## 2021-03-20 DIAGNOSIS — R5383 Other fatigue: Secondary | ICD-10-CM | POA: Diagnosis not present

## 2021-04-19 HISTORY — PX: CATARACT EXTRACTION: SUR2

## 2021-05-27 DIAGNOSIS — H52201 Unspecified astigmatism, right eye: Secondary | ICD-10-CM | POA: Diagnosis not present

## 2021-05-27 DIAGNOSIS — Z7984 Long term (current) use of oral hypoglycemic drugs: Secondary | ICD-10-CM | POA: Diagnosis not present

## 2021-05-27 DIAGNOSIS — H353131 Nonexudative age-related macular degeneration, bilateral, early dry stage: Secondary | ICD-10-CM | POA: Diagnosis not present

## 2021-05-27 DIAGNOSIS — H5213 Myopia, bilateral: Secondary | ICD-10-CM | POA: Diagnosis not present

## 2021-05-27 DIAGNOSIS — H25813 Combined forms of age-related cataract, bilateral: Secondary | ICD-10-CM | POA: Diagnosis not present

## 2021-05-27 DIAGNOSIS — E1136 Type 2 diabetes mellitus with diabetic cataract: Secondary | ICD-10-CM | POA: Diagnosis not present

## 2021-05-27 DIAGNOSIS — H524 Presbyopia: Secondary | ICD-10-CM | POA: Diagnosis not present

## 2021-06-02 DIAGNOSIS — E039 Hypothyroidism, unspecified: Secondary | ICD-10-CM | POA: Diagnosis not present

## 2021-06-02 DIAGNOSIS — M797 Fibromyalgia: Secondary | ICD-10-CM | POA: Diagnosis not present

## 2021-06-02 DIAGNOSIS — E7800 Pure hypercholesterolemia, unspecified: Secondary | ICD-10-CM | POA: Diagnosis not present

## 2021-06-02 DIAGNOSIS — K219 Gastro-esophageal reflux disease without esophagitis: Secondary | ICD-10-CM | POA: Diagnosis not present

## 2021-06-02 DIAGNOSIS — E119 Type 2 diabetes mellitus without complications: Secondary | ICD-10-CM | POA: Diagnosis not present

## 2021-06-02 DIAGNOSIS — I10 Essential (primary) hypertension: Secondary | ICD-10-CM | POA: Diagnosis not present

## 2021-06-02 DIAGNOSIS — F324 Major depressive disorder, single episode, in partial remission: Secondary | ICD-10-CM | POA: Diagnosis not present

## 2021-06-02 DIAGNOSIS — J4520 Mild intermittent asthma, uncomplicated: Secondary | ICD-10-CM | POA: Diagnosis not present

## 2021-06-10 DIAGNOSIS — H25812 Combined forms of age-related cataract, left eye: Secondary | ICD-10-CM | POA: Diagnosis not present

## 2021-06-10 DIAGNOSIS — H25011 Cortical age-related cataract, right eye: Secondary | ICD-10-CM | POA: Diagnosis not present

## 2021-06-10 DIAGNOSIS — H2511 Age-related nuclear cataract, right eye: Secondary | ICD-10-CM | POA: Diagnosis not present

## 2021-06-17 DIAGNOSIS — H2512 Age-related nuclear cataract, left eye: Secondary | ICD-10-CM | POA: Diagnosis not present

## 2021-06-17 DIAGNOSIS — H25012 Cortical age-related cataract, left eye: Secondary | ICD-10-CM | POA: Diagnosis not present

## 2021-07-07 DIAGNOSIS — H35033 Hypertensive retinopathy, bilateral: Secondary | ICD-10-CM | POA: Diagnosis not present

## 2021-07-07 DIAGNOSIS — H353132 Nonexudative age-related macular degeneration, bilateral, intermediate dry stage: Secondary | ICD-10-CM | POA: Diagnosis not present

## 2021-07-07 DIAGNOSIS — H43822 Vitreomacular adhesion, left eye: Secondary | ICD-10-CM | POA: Diagnosis not present

## 2021-07-07 DIAGNOSIS — H43811 Vitreous degeneration, right eye: Secondary | ICD-10-CM | POA: Diagnosis not present

## 2021-08-14 IMAGING — MG MM DIGITAL SCREENING BILAT W/ TOMO W/ CAD
8 series · 8 of 24 positions shown · non-contrast
Comparison: Previous exam(s).

ACR Breast Density Category a: The breast tissue is almost entirely
fatty.

CLINICAL DATA: Screening.

EXAM:
DIGITAL SCREENING BILATERAL MAMMOGRAM WITH TOMO AND CAD

[L CC synth-2D]
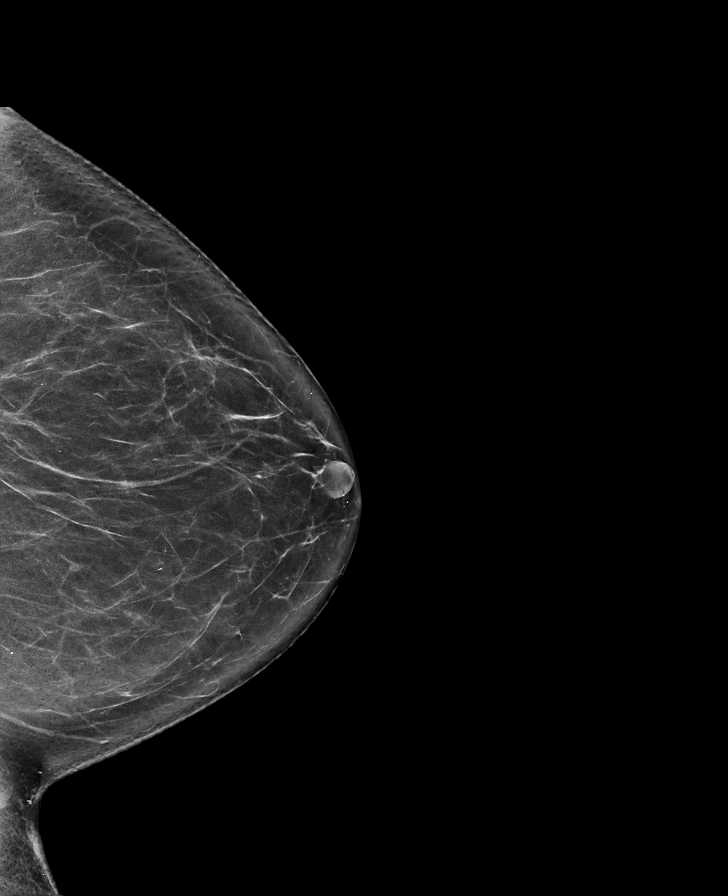

[R CC synth-2D]
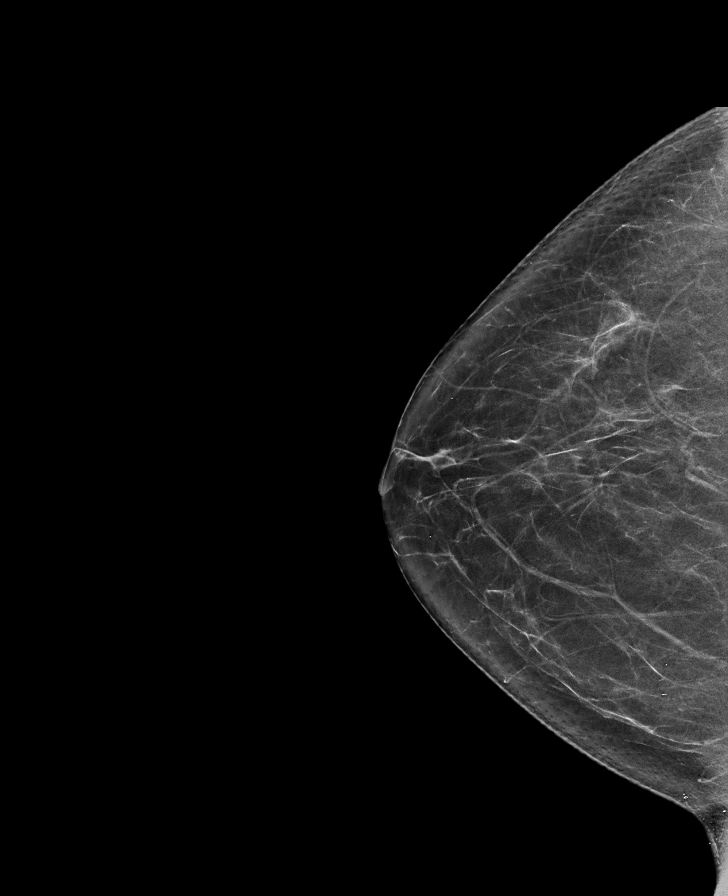

[L MLO synth-2D]
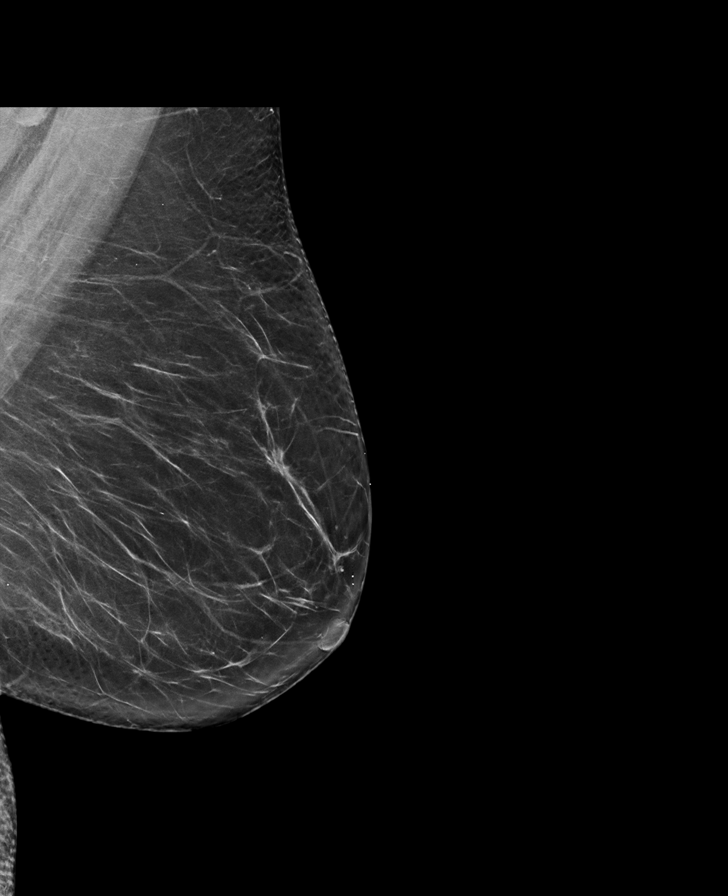

[R MLO synth-2D]
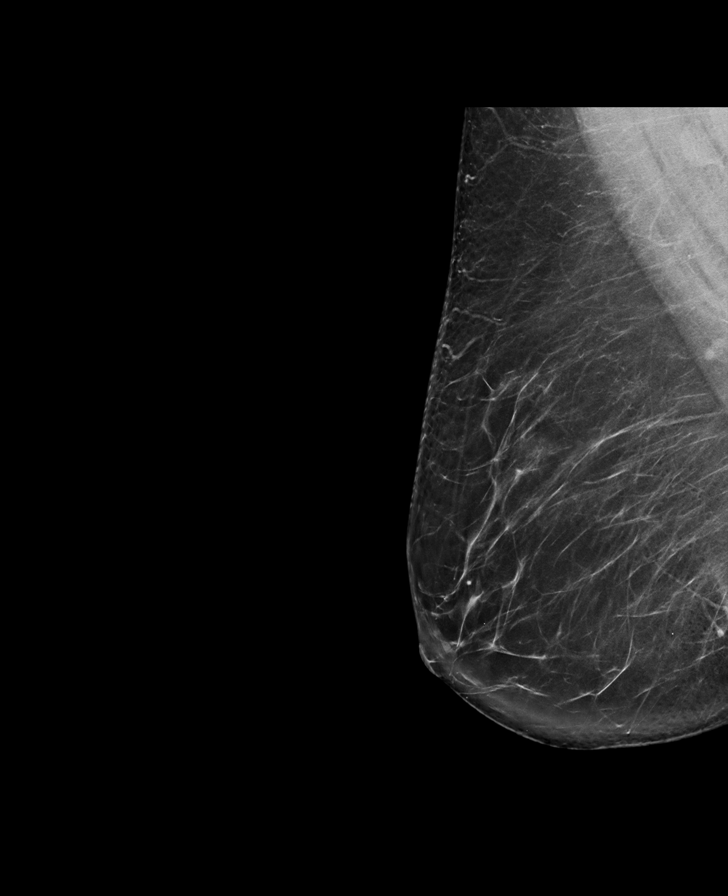

[L CC tomo · tomo slice 37/72.0]
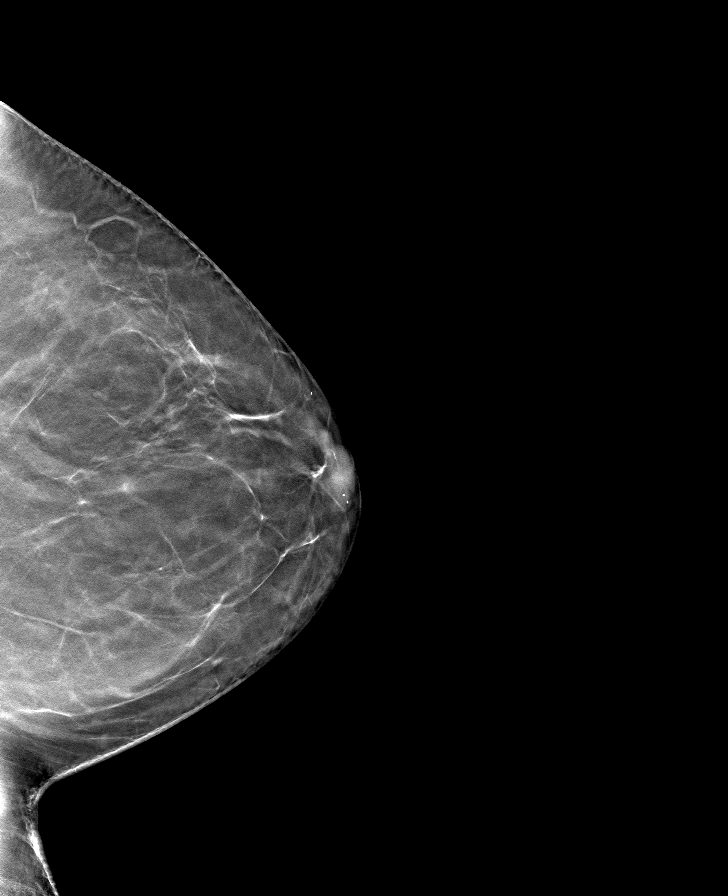

[L MLO tomo · tomo slice 40/79.0]
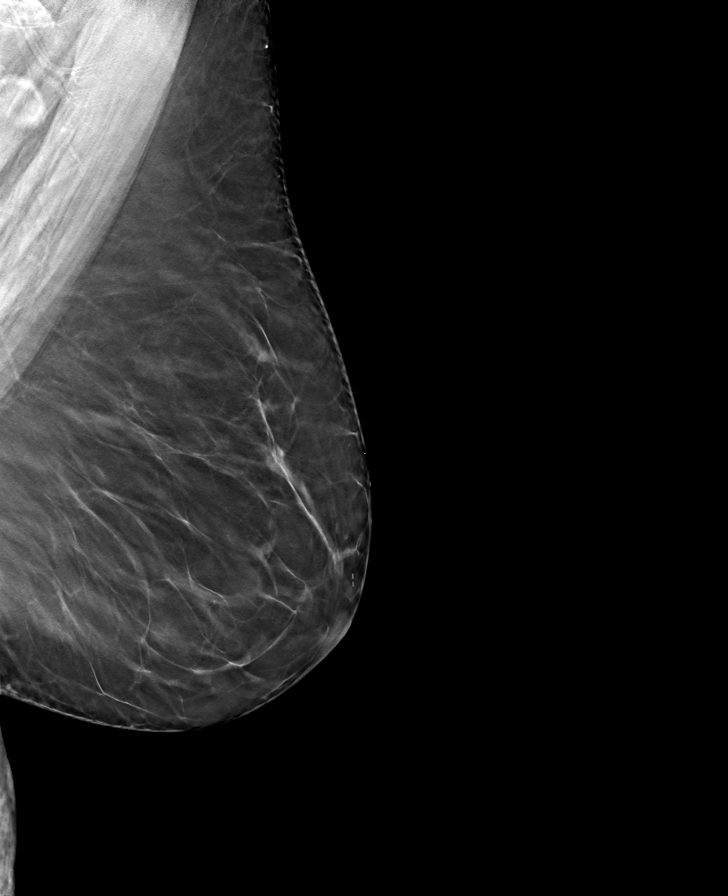

[R MLO tomo · tomo slice 43/85.0]
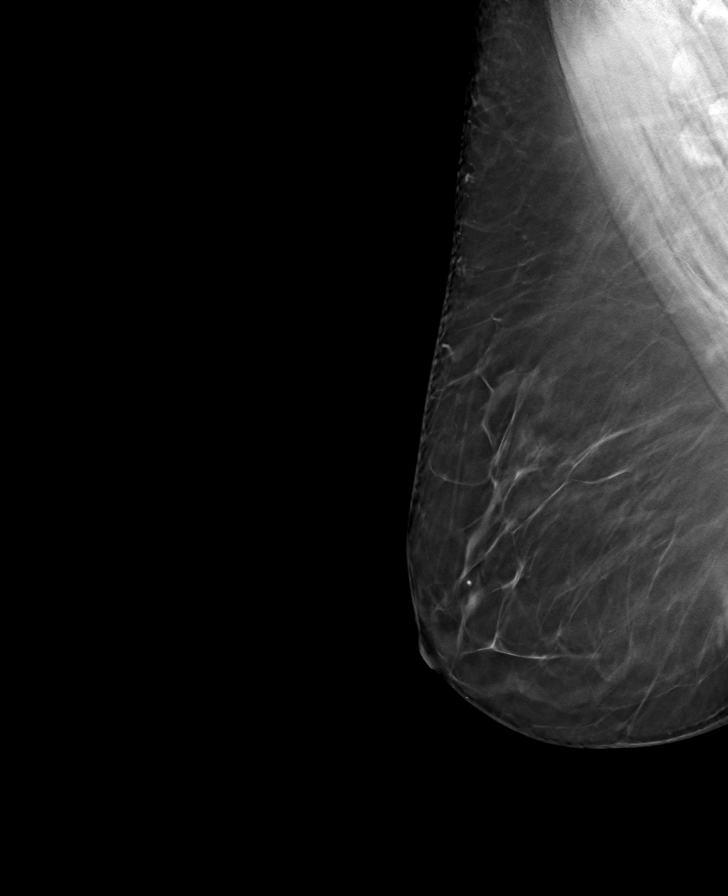

[R CC tomo · tomo slice 35/70.0]
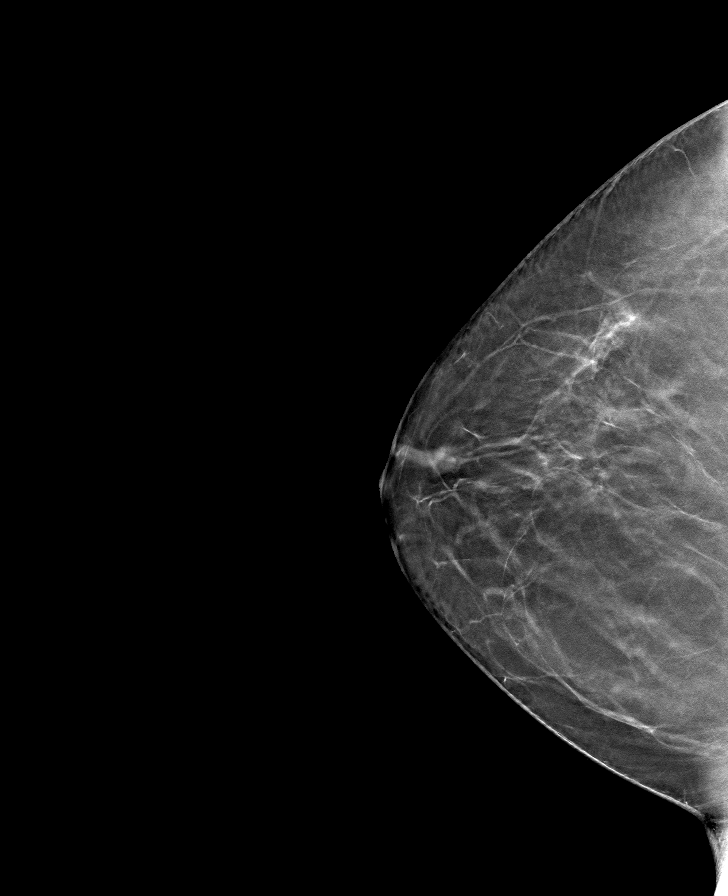

[8 of 24 positions shown; findings below may reference images not displayed]

FINDINGS: There are no findings suspicious for malignancy. Images were
processed with CAD.
IMPRESSION: No mammographic evidence of malignancy. A result letter of this
screening mammogram will be mailed directly to the patient.

RECOMMENDATION:
Screening mammogram in one year. (Code:[id number])

BI-RADS CATEGORY  1: Negative.

## 2021-09-01 DIAGNOSIS — S0501XA Injury of conjunctiva and corneal abrasion without foreign body, right eye, initial encounter: Secondary | ICD-10-CM | POA: Diagnosis not present

## 2021-09-01 DIAGNOSIS — H1131 Conjunctival hemorrhage, right eye: Secondary | ICD-10-CM | POA: Diagnosis not present

## 2021-09-03 DIAGNOSIS — H1131 Conjunctival hemorrhage, right eye: Secondary | ICD-10-CM | POA: Diagnosis not present

## 2021-09-03 DIAGNOSIS — S0501XA Injury of conjunctiva and corneal abrasion without foreign body, right eye, initial encounter: Secondary | ICD-10-CM | POA: Diagnosis not present

## 2021-09-26 NOTE — Progress Notes (Unsigned)
Cardiology Office Note:    Date:  09/26/2021   ID:  [name], DOB October 06, 1955, MRN [id number]  PCP:  Shirline Frees, MD  Cardiologist:  Pixie Casino, MD  Electrophysiologist:  None   Referring MD: Shirline Frees, MD   Chief Complaint: routine follow-up of PFO   History of Present Illness:    Glenda Evans is a 66 y.o. female with a history of PFO s/p closure in 01/2019, palpitations with no significant arrhythmias noted on monitor in 2017, hypertension, hyperlipidemia, prediabetes, TIA, IBS, fibromyalgia who is followed by Dr. Debara Pickett and presents today for routine follow-up.  Patient had a TIA in 2019 at which time medical therapy was recommended with a focus on CV risk reduction.  TEE in 12/2018 showed normal LV function with a moderate PFO with associated atrial septal aneurysm and positive right to left shunt by agitated saline.  She ultimately underwent successful transcatheter PFO closure using a 18 mm Amplatzer PFO occluder device in 01/2019 with Dr. Burt Knack.  Last Echo in 01/2020 showed LVEF of 55-60% with normal wall motion, grade 1 diastolic dysfunction, and negative agitated saline contrast bubble study (no evidence of interatrial shunt). Patient was admitted in 02/2020 with COVID-19 along with her husband. Unfortunately, her husband passed away from [place]. Patient required recurrent hospitalization for COVID and finally noted improvement in her symptoms after starting Breo inhaler. Patient was last seen by Dr. Debara Pickett in 05/2020 at which time she was just about fully recovered from having COVID; however, she was doing well from a cardiac standpoint.  Patient presents today for follow-up. ***  PFO s/p Repair S/p repair in 01/2019. Last Echo in 01/2020 showed LVEF of 55-60% with normal wall motion, grade 1 diastolic dysfunction, and negative agitated saline contrast bubble study (no evidence of interatrial shunt). - Doing well. - Continue on Aspirin '81mg'$  daily alone  indefinitely.   History of Palpitations Monitor in 2017 showed sinus rhythm with PACs but no significant arrhythmias.  - No recent problems with this. *** - Continue Cardizem CD '360mg'$  daily and Lopressor 12.'5mg'$  twice daily.  Hypertension BP well controlled. - Continue Losartan '100mg'$  daily, Cardizem CD '360mg'$  daily, and Lopressor 12.'5mg'$  twice daily.  Hyperlipidemia Most recent lipid panel from ***: - Continue Lipitor '40mg'$  daily. - ***  Past Medical History:  Diagnosis Date   Allergy    Anxiety    Arthritis    Asthma    Cataract    right eye    Depression    Fatigue    Fibromyalgia    GERD (gastroesophageal reflux disease)    Heart murmur    HLD (hyperlipidemia)    Hyperglycemia    Hypertension    Hypothyroidism    IBS (irritable bowel syndrome)    Insomnia    Migraine    Mitral valve prolapse    Osteopenia    Pre-diabetes    PVC (premature ventricular contraction)    Stein-Leventhal syndrome    Wears glasses     Past Surgical History:  Procedure Laterality Date   ABDOMINAL HYSTERECTOMY     BUBBLE STUDY  01/04/2019   Procedure: BUBBLE STUDY;  Surgeon: Pixie Casino, MD;  Location: [facility];  Service: Cardiovascular;;   CESAREAN SECTION     x 2   CHOLECYSTECTOMY     COLONOSCOPY     LIPOMA EXCISION  11/26/10   thigh x3 cyst   OVARY SURGERY     to poke holes in ovaries for PCOD  PATENT FORAMEN OVALE(PFO) CLOSURE N/A 02/15/2019   Procedure: PATENT FORAMEN OVALE (PFO) CLOSURE;  Surgeon: Sherren Mocha, MD;  Location: [place] [facility];  Service: Cardiovascular;  Laterality: N/A;   TEE WITHOUT CARDIOVERSION N/A 01/04/2019   Procedure: TRANSESOPHAGEAL ECHOCARDIOGRAM (TEE);  Surgeon: Pixie Casino, MD;  Location: [facility] END[facility]COPY;  Service: Cardiovascular;  Laterality: N/A;   TONSILLECTOMY      Current Medications: No outpatient medications have been marked as taking for the 10/08/21 encounter (Appointment) with Darreld Mclean, PA-C.   Current  Facility-Administered Medications for the 10/08/21 encounter (Appointment) with Darreld Mclean, PA-C  Medication   triamcinolone acetonide (KENALOG) 10 MG/ML injection 10 mg     Allergies:   Augmentin [amoxicillin-pot clavulanate], Carvedilol, Erythromycin, Lyrica [pregabalin], Macrobid [nitrofurantoin macrocrystal], Metaxalone, Ofloxacin, Pamelor, Relafen [nabumetone], Septra [bactrim], Sulfasalazine, and Escitalopram oxalate   Social History   Socioeconomic History   Marital status: Divorced    Spouse name: Not on file   Number of children: 2   Years of education: Not on file   Highest education level: Not on file  Occupational History   Occupation: disabled  Tobacco Use   Smoking status: Never   Smokeless tobacco: Never  Substance and Sexual Activity   Alcohol use: No    Alcohol/week: 0.0 standard drinks of alcohol   Drug use: No   Sexual activity: Not on file  Other Topics Concern   Not on file  Social History Narrative   Not on file   Social Determinants of Health   Financial Resource Strain: Not on file  Food Insecurity: Not on file  Transportation Needs: Not on file  Physical Activity: Not on file  Stress: Not on file  Social Connections: Not on file     Family History: The patient's family history includes Breast cancer in her maternal grandmother; COPD in her father, maternal aunt, and maternal grandmother; Colon polyps in her father, maternal aunt, maternal grandmother, mother, and sister; Diabetes in her maternal aunt, maternal grandmother, and sister; Heart disease in her maternal grandmother, mother, and sister; Hypertension in her father; Ovarian cancer in her paternal aunt. There is no history of Colon cancer, Esophageal cancer, Rectal cancer, or Stomach cancer.  ROS:   Please see the history of present illness.     EKGs/Labs/Other Studies Reviewed:    The following studies were reviewed today:  Monitor 08/07/2015 to 08/25/2015: Sinus rhythm with  PAC's. _______________  Echocardiogram with Bubble Study 01/24/2020: Impressions: 1. Left ventricular ejection fraction, by estimation, is 55 to 60%. The  left ventricle has normal function. The left ventricle has no regional  wall motion abnormalities. Left ventricular diastolic parameters are  consistent with Grade I diastolic  dysfunction (impaired relaxation).   2. Right ventricular systolic function is normal. The right ventricular  size is normal.   3. The mitral valve is grossly normal. No evidence of mitral valve  regurgitation.   4. The aortic valve is grossly normal. Aortic valve regurgitation is not  visualized.   5. Agitated saline contrast bubble study was negative, with no evidence  of any interatrial shunt.   Comparison(s): No significant change from prior study. Compared to prior study (TEE) no evidence of R-L Shunt.   EKG:  EKG ordered today. EKG personally reviewed and demonstrates ***.  Recent Labs: No results found for requested labs within last 365 days.  Recent Lipid Panel    Component Value Date/Time   CHOL 199 07/10/2018 0943   TRIG 231 (H) 07/10/2018  0943   HDL 41 07/10/2018 0943   CHOLHDL 4.9 (H) 07/10/2018 0943   LDLCALC 112 (H) 07/10/2018 0943   LDLDIRECT 128 (H) 07/10/2018 0943    Physical Exam:    Vital Signs: There were no vitals taken for this visit.    Wt Readings from Last 3 Encounters:  06/16/20 216 lb 12.8 oz (98.3 kg)  04/08/20 213 lb (96.6 kg)  01/24/20 225 lb (102.1 kg)     General: 66 y.o. female in no acute distress. HEENT: Normocephalic and atraumatic. Sclera clear. EOMs intact. Neck: Supple. No carotid bruits. No JVD. Heart: *** RRR. Distinct S1 and S2. No murmurs, gallops, or rubs. Radial and distal pedal pulses 2+ and equal bilaterally. Lungs: No increased work of breathing. Clear to ausculation bilaterally. No wheezes, rhonchi, or rales.  Abdomen: Soft, non-distended, and non-tender to palpation. Bowel sounds present in  all 4 quadrants.  MSK: Normal strength and tone for age. *** Extremities: No lower extremity edema.    Skin: Warm and dry. Neuro: Alert and oriented x3. No focal deficits. Psych: Normal affect. Responds appropriately.   Assessment:    No diagnosis found.  Plan:     Disposition: Follow up in ***   Medication Adjustments/Labs and Tests Ordered: Current medicines are reviewed at length with the patient today.  Concerns regarding medicines are outlined above.  No orders of the defined types were placed in this encounter.  No orders of the defined types were placed in this encounter.   There are no Patient Instructions on file for this visit.   Signed, Darreld Mclean, PA-C  09/26/2021 10:12 PM    [facility]

## 2021-10-06 DIAGNOSIS — J324 Chronic pansinusitis: Secondary | ICD-10-CM | POA: Diagnosis not present

## 2021-10-06 DIAGNOSIS — R0600 Dyspnea, unspecified: Secondary | ICD-10-CM | POA: Diagnosis not present

## 2021-10-08 ENCOUNTER — Encounter: Payer: Self-pay | Admitting: Student

## 2021-10-08 ENCOUNTER — Ambulatory Visit (INDEPENDENT_AMBULATORY_CARE_PROVIDER_SITE_OTHER): Payer: Medicare Other | Admitting: Student

## 2021-10-08 VITALS — BP 130/90 | HR 66 | Ht 65.0 in | Wt 217.0 lb

## 2021-10-08 DIAGNOSIS — R002 Palpitations: Secondary | ICD-10-CM | POA: Diagnosis not present

## 2021-10-08 DIAGNOSIS — R0781 Pleurodynia: Secondary | ICD-10-CM

## 2021-10-08 DIAGNOSIS — Q2112 Patent foramen ovale: Secondary | ICD-10-CM

## 2021-10-08 DIAGNOSIS — E785 Hyperlipidemia, unspecified: Secondary | ICD-10-CM | POA: Diagnosis not present

## 2021-10-08 DIAGNOSIS — Z87898 Personal history of other specified conditions: Secondary | ICD-10-CM | POA: Diagnosis not present

## 2021-10-08 DIAGNOSIS — I10 Essential (primary) hypertension: Secondary | ICD-10-CM

## 2021-10-08 DIAGNOSIS — R0600 Dyspnea, unspecified: Secondary | ICD-10-CM | POA: Diagnosis not present

## 2021-10-08 LAB — BASIC METABOLIC PANEL
BUN/Creatinine Ratio: 19 (ref 12–28)
BUN: 15 mg/dL (ref 8–27)
CO2: 24 mmol/L (ref 20–29)
Calcium: 9.5 mg/dL (ref 8.7–10.3)
Chloride: 104 mmol/L (ref 96–106)
Creatinine, Ser: 0.81 mg/dL (ref 0.57–1.00)
Glucose: 104 mg/dL — ABNORMAL HIGH (ref 70–99)
Potassium: 4.1 mmol/L (ref 3.5–5.2)
Sodium: 142 mmol/L (ref 134–144)
eGFR: 82 mL/min/{1.73_m2} (ref >59)

## 2021-10-08 LAB — CBC
Hematocrit: 39.9 % (ref 34.0–46.6)
Hemoglobin: 13.5 g/dL (ref 11.1–15.9)
MCH: 31.5 pg (ref 26.6–33.0)
MCHC: 33.8 g/dL (ref 31.5–35.7)
MCV: 93 fL (ref 79–97)
Platelets: 373 10*3/uL (ref 150–450)
RBC: 4.29 x10E6/uL (ref 3.77–5.28)
RDW: 12.8 % (ref 11.7–15.4)
WBC: 7.5 10*3/uL (ref 3.4–10.8)

## 2021-10-08 LAB — TSH: TSH: 2.39 u[IU]/mL (ref 0.450–4.500)

## 2021-10-08 LAB — MAGNESIUM: Magnesium: 1.4 mg/dL — ABNORMAL LOW (ref 1.6–2.3)

## 2021-10-08 NOTE — Patient Instructions (Signed)
Medication Instructions:  No Changes *If you need a refill on your cardiac medications before your next appointment, please call your pharmacy*   Lab Work: CBC, BMET, Magnesium Level,TSH. Today If you have labs (blood work) drawn today and your tests are completely normal, you will receive your results only by: [place] (if you have MyChart) OR A paper copy in the mail If you have any lab test that is abnormal or we need to change your treatment, we will call you to review the results.   Testing/Procedures: [address]. Your physician has requested that you have an echocardiogram. Echocardiography is a painless test that uses sound waves to create images of your heart. It provides your doctor with information about the size and shape of your heart and how well your heart's chambers and valves are working. This procedure takes approximately one hour. There are no restrictions for this procedure.    Follow-Up: At [facility], you and your health needs are our priority.  As part of our continuing mission to provide you with exceptional heart care, we have created designated Provider Care Teams.  These Care Teams include your primary Cardiologist (physician) and Advanced Practice Providers (APPs -  Physician Assistants and Nurse Practitioners) who all work together to provide you with the care you need, when you need it.  We recommend signing up for the patient portal called "MyChart".  Sign up information is provided on this After Visit Summary.  MyChart is used to connect with patients for Virtual Visits (Telemedicine).  Patients are able to view lab/test results, encounter notes, upcoming appointments, etc.  Non-urgent messages can be sent to your provider as well.   To learn more about what you can do with MyChart, go to [email or web address].    Your next appointment:   Schedule after Echo  The format for your next appointment:   In Person  Provider:    Sande Rives, PA-C    Then, Pixie Casino, MD will plan to see you again in 6 month(s).      Important Information About Sugar

## 2021-10-09 ENCOUNTER — Other Ambulatory Visit: Payer: Self-pay

## 2021-10-09 DIAGNOSIS — I10 Essential (primary) hypertension: Secondary | ICD-10-CM

## 2021-10-09 DIAGNOSIS — R002 Palpitations: Secondary | ICD-10-CM

## 2021-10-09 MED ORDER — MAGNESIUM OXIDE (ELEMENTAL) 400 MG PO TABS: 400.0000 mg | ORAL_TABLET | Freq: Every day | ORAL | 0 refills | Status: DC

## 2021-10-18 NOTE — Progress Notes (Signed)
Cardiology Office Note:    Date:  10/26/2021   ID:  Glenda Evans, DOB [birth year], MRN [id number]  PCP:  Johny Blamer, MD  Cardiologist:  Chrystie Nose, MD  Electrophysiologist:  None   Referring MD: Johny Blamer, MD   Chief Complaint: follow-up of shortness of breath  History of Present Illness:    Glenda Evans is a 66 y.o. female with a history of PFO s/p closure in 01/2019, palpitations with no significant arrhythmias noted on monitor in 2017, hypertension, hyperlipidemia, prediabetes, TIA, IBS, fibromyalgia who is followed by Dr. Rennis Golden and presents today for follow-up of dyspnea.   Patient had a TIA in 2019 at which time medical therapy was recommended with a focus on CV risk reduction.  TEE in 12/2018 showed normal LV function with a moderate PFO with associated atrial septal aneurysm and positive right to left shunt by agitated saline.  She ultimately underwent successful transcatheter PFO closure using a 18 mm Amplatzer PFO occluder device in 01/2019 with Dr. Excell Seltzer.  Last Echo in 01/2020 showed LVEF of 55-60% with normal wall motion, grade 1 diastolic dysfunction, and negative agitated saline contrast bubble study (no evidence of interatrial shunt). Patient was admitted in 02/2020 with COVID-19 along with her husband. Unfortunately, her husband passed away from COVID. Patient required recurrent hospitalization for COVID and finally noted improvement in her symptoms after starting Breo inhaler. Patient was last seen by Dr. Rennis Golden in 05/2020 at which time she was just about fully recovered from having COVID; however, she was doing well from a cardiac standpoint.   Patient was last seen by me on 10/08/2021 at which time her biggest complaint was new dyspnea both at rest and exertion with some pleuritic chest pain. She also described orthopnea, PND, and a 3-5 lb weight gain. She had recently been seen at an Urgent and diagnosed with a sinus infection and possible bronchitis and  started on antibiotics with no significant improvement. She did not appear grossly volume overloaded on exam. BNP was ordered but it does not look like this was done. Echo was also ordered and showed LVEF of 55-60% with normal wall motion and diastolic parameters and no evidence of residual shunt from prior PFO closure. She also noted worsening palpitations. Labs showed that her magnesium was slightly low so this was supplemented.  Patient presents today for follow-up. Here alone.  She has multiple complaints today. She is still having shortness of breath both at rest and with exertion but states it may be slightly better. She states breathing is worse when laying down at night and she describes 2 pillow orthopnea and PND which is new over the last month or so. She denies any chest pain but reports throat pain and states it feels like a "fist in her throat." She states this is constant and not worse with exertion. She also reports intermittent episodes of diaphoresis. She states she was walking around [place] earlier today and started dripping sweat. She is a little cold and clammy on exam today. This is not new since last visit. EKG today shows no acute ischemic changes. She continues to have palpitations that are worse when normal. These are worse when she is acutely short of breath and when she stretches out her legs. She is not sure if the palpitations occur first and then she develops shortness of breath or vice versa. She also reports intermittent lightheadedness, dizziness, and near syncope that she describes as an almost tunnel vision. No syncope. She  recently went to the beach and she states while there every time she came back inside from the heat, it felt like she had rain falling on her. She has not had this sensation in the last week.   Past Medical History:  Diagnosis Date   Allergy    Anxiety    Arthritis    Asthma    Cataract    right eye    Depression    Fatigue    Fibromyalgia     GERD (gastroesophageal reflux disease)    Heart murmur    HLD (hyperlipidemia)    Hypertension    Hypothyroidism    IBS (irritable bowel syndrome)    Insomnia    Migraine    Mitral valve prolapse    Osteopenia    Pre-diabetes    PVC (premature ventricular contraction)    Stein-Leventhal syndrome    Wears glasses     Past Surgical History:  Procedure Laterality Date   ABDOMINAL HYSTERECTOMY     BUBBLE STUDY  01/04/2019   Procedure: BUBBLE STUDY;  Surgeon: Chrystie Nose, MD;  Location: [facility];  Service: Cardiovascular;;   CESAREAN SECTION     x 2   CHOLECYSTECTOMY     COLONOSCOPY     LIPOMA EXCISION  11/26/10   thigh x3 cyst   OVARY SURGERY     to poke holes in ovaries for PCOD   PATENT FORAMEN OVALE(PFO) CLOSURE N/A 02/15/2019   Procedure: PATENT FORAMEN OVALE (PFO) CLOSURE;  Surgeon: Tonny Bollman, MD;  Location: [facility] INVASIVE CV LAB;  Service: Cardiovascular;  Laterality: N/A;   TEE WITHOUT CARDIOVERSION N/A 01/04/2019   Procedure: TRANSESOPHAGEAL ECHOCARDIOGRAM (TEE);  Surgeon: Chrystie Nose, MD;  Location: [facility];  Service: Cardiovascular;  Laterality: N/A;   TONSILLECTOMY      Current Medications: Current Meds  Medication Sig   Accu-Chek FastClix Lancets MISC FOR USE WHEN CHECKING BLOODSUGARS AS DIRECTED ONCE DAILY DX E11.9 102 DAYS   ACCU-CHEK GUIDE test strip USE AS DIRECTED ONCE A DAY   albuterol (PROVENTIL HFA;VENTOLIN HFA) 108 (90 BASE) MCG/ACT inhaler Inhale 2 puffs into the lungs every 6 (six) hours as needed for wheezing or shortness of breath.    aspirin EC 81 MG tablet Take 81 mg by mouth daily.   atorvastatin (LIPITOR) 40 MG tablet TAKE 1 TABLET (40 MG TOTAL) BY MOUTH DAILY AT 6 PM.   Black Cohosh 40 MG CAPS Take 40 mg by mouth daily.    diltiazem (CARDIZEM CD) 360 MG 24 hr capsule TAKE 1 CAPSULE BY MOUTH EVERY DAY   levothyroxine (SYNTHROID, LEVOTHROID) 125 MCG tablet Take 150 mcg by mouth daily before breakfast. TOTAL 150 MG   loratadine  (CLARITIN) 10 MG tablet Take 10 mg by mouth daily as needed for allergies.    losartan (COZAAR) 100 MG tablet TAKE 1 TABLET BY MOUTH EVERY DAY   Magnesium Oxide, Elemental, 400 MG TABS Take 400 mg by mouth daily.   metFORMIN (GLUCOPHAGE-XR) 500 MG 24 hr tablet Take 500 mg by mouth daily.    metoprolol succinate (TOPROL XL) 25 MG 24 hr tablet Take 1 tablet (25 mg total) by mouth daily.   metoprolol tartrate (LOPRESSOR) 100 MG tablet Take 2 hours prior to CT scan   Multiple Vitamin (MULTIVITAMIN WITH MINERALS) TABS tablet Take 1 tablet by mouth daily. Vita Fusion   Nutritional Supplements (ESTROVEN PO) Take 1 tablet by mouth daily.    Potassium Chloride ER 20 MEQ TBCR TAKE 1 TABLET  BY MOUTH EVERY DAY   Vitamin D, Ergocalciferol, (DRISDOL) 1.25 MG (50000 UNIT) CAPS capsule Take 50,000 Units by mouth once a week.   [DISCONTINUED] metoprolol tartrate (LOPRESSOR) 25 MG tablet Take 12.5 mg by mouth 2 (two) times daily.   Current Facility-Administered Medications for the 10/26/21 encounter (Office Visit) with Corrin Parker, PA-C  Medication   triamcinolone acetonide (KENALOG) 10 MG/ML injection 10 mg     Allergies:   Carvedilol, Erythromycin, Lyrica [pregabalin], Metaxalone, Nitrofurantoin macrocrystal, Ofloxacin, Pamelor, Relafen [nabumetone], Septra [bactrim], Sulfasalazine, Escitalopram oxalate, Nitrofurantoin, Nortriptyline, Sulfa antibiotics, Sulfamethoxazole-trimethoprim, and Tricyclic antidepressants   Social History   Socioeconomic History   Marital status: Divorced    Spouse name: Not on file   Number of children: 2   Years of education: Not on file   Highest education level: Not on file  Occupational History   Occupation: disabled  Tobacco Use   Smoking status: Never   Smokeless tobacco: Never  Substance and Sexual Activity   Alcohol use: No    Alcohol/week: 0.0 standard drinks of alcohol   Drug use: No   Sexual activity: Not on file  Other Topics Concern   Not on file   Social History Narrative   Not on file   Social Determinants of Health   Financial Resource Strain: Not on file  Food Insecurity: Not on file  Transportation Needs: Not on file  Physical Activity: Not on file  Stress: Not on file  Social Connections: Not on file     Family History: The patient's family history includes Breast cancer in her maternal grandmother; COPD in her father, maternal aunt, and maternal grandmother; Colon polyps in her father, maternal aunt, maternal grandmother, mother, and sister; Diabetes in her maternal aunt, maternal grandmother, and sister; Heart disease in her maternal grandmother, mother, and sister; Hypertension in her father; Ovarian cancer in her paternal aunt. There is no history of Colon cancer, Esophageal cancer, Rectal cancer, or Stomach cancer.  ROS:   Please see the history of present illness.     EKGs/Labs/Other Studies Reviewed:    The following studies were reviewed today:  Monitor 08/07/2015 to 08/25/2015: Sinus rhythm with PAC's. _______________   Echocardiogram with Bubble Study 01/24/2020: Impressions: 1. Left ventricular ejection fraction, by estimation, is 55 to 60%. The  left ventricle has normal function. The left ventricle has no regional  wall motion abnormalities. Left ventricular diastolic parameters are  consistent with Grade I diastolic  dysfunction (impaired relaxation).   2. Right ventricular systolic function is normal. The right ventricular  size is normal.   3. The mitral valve is grossly normal. No evidence of mitral valve  regurgitation.   4. The aortic valve is grossly normal. Aortic valve regurgitation is not  visualized.   5. Agitated saline contrast bubble study was negative, with no evidence  of any interatrial shunt.   Comparison(s): No significant change from prior study. Compared to prior study (TEE) no evidence of R-L Shunt.  _______________  Echocardiogram 10/22/2021: Impressions: 1. Left ventricular  ejection fraction, by estimation, is 55 to 60%. The  left ventricle has normal function. The left ventricle has no regional  wall motion abnormalities. Left ventricular diastolic parameters were  normal.   2. Right ventricular systolic function is normal. The right ventricular  size is normal. There is normal pulmonary artery systolic pressure.   3. S/P PFO closure.   4. The mitral valve is normal in structure. Trivial mitral valve  regurgitation. No evidence of mitral  stenosis.   5. The aortic valve is tricuspid. Aortic valve regurgitation is not  visualized. No aortic stenosis is present.   6. The inferior vena cava is normal in size with greater than 50%  respiratory variability, suggesting right atrial pressure of 3 mmHg.   Comparison(s): No significant change from prior study.   EKG:  EKG ordered today. EKG personally reviewed and demonstrates normal sinus rhythm, rate 73 bpm, with some underlying artifact/baseline wandering. Left axis deviation. Normal PR and QRS intervals. QTc 425 ms.  Recent Labs: 10/08/2021: BUN 15; Creatinine, Ser 0.81; Hemoglobin 13.5; Platelets 373; Potassium 4.1; Sodium 142; TSH 2.390 10/19/2021: Magnesium 1.6  Recent Lipid Panel    Component Value Date/Time   CHOL 199 07/10/2018 0943   TRIG 231 (H) 07/10/2018 0943   HDL 41 07/10/2018 0943   CHOLHDL 4.9 (H) 07/10/2018 0943   LDLCALC 112 (H) 07/10/2018 0943   LDLDIRECT 128 (H) 07/10/2018 0943    Physical Exam:    Vital Signs: BP 120/90 (BP Location: Left Arm)   Pulse 85   Ht 5\' 5"  (1.651 m)   Wt 216 lb 3.2 oz (98.1 kg)   SpO2 96%   BMI 35.98 kg/m     Wt Readings from Last 3 Encounters:  10/26/21 216 lb 3.2 oz (98.1 kg)  10/08/21 217 lb (98.4 kg)  06/16/20 216 lb 12.8 oz (98.3 kg)     General: 66 y.o. Caucasian female in no acute distress. HEENT: Normocephalic and atraumatic. Sclera clear. EOMs intact. Neck: Supple. No JVD. Heart:  RRR. Distinct S1 and S2. No murmurs, gallops, or rubs.   Lungs: No increased work of breathing. Clear to ausculation bilaterally. No wheezes, rhonchi, or rales.  Abdomen: Soft, non-distended, and non-tender to palpation.  Extremities: Trace lower extremity edema bilaterally.    Skin: Slightly cool and clammy to the touch. Neuro: Alert and oriented x3. No focal deficits. Psych: Normal affect. Responds appropriately.   Assessment:    1. Shortness of breath   2. Throat pain   3. Diaphoresis   4. PFO s/p repair   5. Dizziness   6. Palpitations   7. Near syncope   8. Primary hypertension   9. Hyperlipidemia, unspecified hyperlipidemia type   10. Hypertensive heart disease without heart failure     Plan:    Dyspnea Throat Pain Intermittent Diaphoresis Patient reported new dyspnea both at rest and with exertion at recent office visit. Per patient D-dimer and chest x-ray at Urgent Care were negative. Echo showed normal LV function and diastolic parameters.  - She continues to have dyspnea both at rest and with exertion. She also describes orthopnea and PND. She denies any chest pain but reports throat pain and intermittent diaphoresis. - She does not appear volume overload on exam. Weight stable. - Will check a BNP and if markedly elevated, start Lasix. - Will order coronary CTA to rule out significant CAD. Will give prescription of Lopressor 100mg  for patient to take 1.5 to 2 hours before scan.  - Discussed with patient that if above work-up is negative, symptoms are not cardiac in nature. Recommended going ahead and following up with PCP as well given various symptoms. In regards to patient's throat pain which she describes as a "fist in her throat," this is located at the level of her thyroid. She does have a history of hypothyroidism. TSH was normal at last visit. Recommended discussing with PCP - wonder if she needs a thyroid ultrasound.   PFO s/p Repair S/p  repair in 01/2019. Recent Echo on 10/22/2021 showed no evidence of residual  shunt. - Continue on Aspirin 81mg  daily alone indefinitely.    Palpitations Lightheadedness/ Dizziness/ Near Syncope Monitor in 2017 showed sinus rhythm with PACs but no significant arrhythmias. Patient reported worsening palpitations at recent visit. Magnesium was found to be mildly low and this was supplement. - Continue to have palpitations that are worse than baseline. She also reports intermittent lightheadedness, dizziness, and near syncope. - Currently on Cardizem CD 360mg  daily and Lopressor 12.5mg  daily (she has trouble remembering to take this twice daily). Will switch Lopressor to Toprol-XL 25mg  daily.  - Will order 3 day Zio monitor. - Will recheck Magnesium to make sure this is stable. - Advised patient to check her BP/HR the next time she has a severe episode of lightheadedness/ dizziness/ near syncope.   Hypertension BP mildly elevated in office. Initial BP 120/90 and 124/90 on my recheck at the end of visit. - Continue Losartan 100mg  daily and Cardizem CD 360mg  daily. Switching to Toprol-XL 25mg  daily as above. - Will hold off on making more aggressive changes right now given reports of lightheadedness, dizziness, and near syncope.  Hyperlipidemia Most recent lipid panel from 11/2020 (per [facility]): Total Cholesterol 220, Triglycerides 338, HDL 45, LDL 116. - Continue Lipitor 40mg  daily. - Did not have time to discuss today. Can discuss at follow-up. Will likely recommended increasing Lipitor to 80mg  daily.  Disposition: Follow up in 1 month after above tests.   Medication Adjustments/Labs and Tests Ordered: Current medicines are reviewed at length with the patient today.  Concerns regarding medicines are outlined above.  Orders Placed This Encounter  Procedures   CT CORONARY MORPH W/CTA COR W/SCORE W/CA W/CM &/OR WO/CM   Pro b natriuretic peptide (BNP)[id number])   Magnesium   LONG TERM MONITOR (3-14 DAYS)   EKG 12-Lead   Meds ordered this  encounter  Medications   metoprolol tartrate (LOPRESSOR) 100 MG tablet    Sig: Take 2 hours prior to CT scan    Dispense:  1 tablet    Refill:  0   metoprolol succinate (TOPROL XL) 25 MG 24 hr tablet    Sig: Take 1 tablet (25 mg total) by mouth daily.    Dispense:  90 tablet    Refill:  3    Patient Instructions  Medication Instructions:   STOP CURRENT METOPROLOL  START METOPROLOL SUCC ER 25 MG ONCE DAILY  *If you need a refill on your cardiac medications before your next appointment, please call your pharmacy*   Testing/Procedures:    Your cardiac CT will be scheduled at   [facility] [address] [place], [place] [address] [phone]   If scheduled at [facility], please arrive at the [facility] and Children's Entrance (Entrance C2) of [facility] 30 minutes prior to test start time. You can use the FREE valet parking offered at entrance C (encouraged to control the heart rate for the test)  Proceed to the [facility] Radiology Department (first floor) to check-in and test prep.  All radiology patients and guests should use entrance C2 at [facility], accessed from [facility], even though the hospital's physical address listed is [address].    If scheduled at [facility], please arrive 15 mins early for check-in and test prep.  Please follow these instructions carefully (unless otherwise directed):   On the Night Before the Test: Be  sure to Drink plenty of water. Do not consume any caffeinated/decaffeinated beverages or chocolate 12 hours prior to your test. Do not take any antihistamines 12 hours prior to your test.   On the Day of the Test: Drink plenty of water until 1 hour prior to the test. Do not eat any food 4 hours prior to the test. You may take your regular medications prior to the test.  Take metoprolol (Lopressor) 100 mg two hours prior to test. FEMALES-  please wear underwire-free bra if available, avoid dresses & tight clothing       After the Test: Drink plenty of water. After receiving IV contrast, you may experience a mild flushed feeling. This is normal. On occasion, you may experience a mild rash up to 24 hours after the test. This is not dangerous. If this occurs, you can take Benadryl 25 mg and increase your fluid intake. If you experience trouble breathing, this can be serious. If it is severe call 911 IMMEDIATELY. If it is mild, please call our office. If you take any of these medications: Glipizide/Metformin,  please do not take 48 hours after completing test unless otherwise instructed.  We will call to schedule your test 2-4 weeks out understanding that some insurance companies will need an authorization prior to the service being performed.   For non-scheduling related questions, please contact the cardiac imaging nurse navigator should you have any questions/concerns: Rockwell Alexandria, Cardiac Imaging Nurse Navigator Larey Brick, Cardiac Imaging Nurse Navigator [facility] Direct Office Dial: [phone]   For scheduling needs, including cancellations and rescheduling, please call [place], [phone].    ZIO XT- Long Term Monitor Instructions  Your physician has requested you wear a ZIO patch monitor for 3 days.  This is a single patch monitor. [employer] supplies one patch monitor per enrollment. Additional stickers are not available. Please do not apply patch if you will be having a Nuclear Stress Test,  Echocardiogram, Cardiac CT, MRI, or Chest Xray during the period you would be wearing the  monitor. The patch cannot be worn during these tests. You cannot remove and re-apply the  ZIO XT patch monitor.  Your ZIO patch monitor will be mailed 3 day USPS to your address on file. It may take 3-5 days  to receive your monitor after you have been enrolled.  Once you have received your monitor,  please review the enclosed instructions. Your monitor  has already been registered assigning a specific monitor serial # to you.  Billing and Patient Assistance Program Information  We have supplied [employer] with any of your insurance information on file for billing purposes. [employer] offers a sliding scale Patient Assistance Program for patients that do not have  insurance, or whose insurance does not completely cover the cost of the ZIO monitor.  You must apply for the Patient Assistance Program to qualify for this discounted rate.  To apply, please call [employer] at [phone], select option 4, select option 2, ask to apply for  Patient Assistance Program. Meredeth Ide will ask your household income, and how many people  are in your household. They will quote your out-of-pocket cost based on that information.  [employer] will also be able to set up a 73-month, interest-free payment plan if needed.  Applying the monitor   Shave hair from upper left chest.  Hold abrader disc by orange tab. Rub abrader in 40 strokes over the upper left chest as  indicated in your monitor instructions.  Clean area  with 4 enclosed alcohol pads. Let dry.  Apply patch as indicated in monitor instructions. Patch will be placed under collarbone on left  side of chest with arrow pointing upward.  Rub patch adhesive wings for 2 minutes. Remove white label marked "1". Remove the white  label marked "2". Rub patch adhesive wings for 2 additional minutes.  While looking in a mirror, press and release button in center of patch. A small green light will  flash 3-4 times. This will be your only indicator that the monitor has been turned on.  Do not shower for the first 24 hours. You may shower after the first 24 hours.  Press the button if you feel a symptom. You will hear a small click. Record Date, Time and  Symptom in the Patient Logbook.  When you are ready to remove the patch, follow instructions on the last 2 pages of  Patient  Logbook. Stick patch monitor onto the last page of Patient Logbook.  Place Patient Logbook in the blue and white box. Use locking tab on box and tape box closed  securely. The blue and white box has prepaid postage on it. Please place it in the mailbox as  soon as possible. Your physician should have your test results approximately 7 days after the  monitor has been mailed back to [facility].  Call [facility] Customer Care at [phone] if you have questions regarding  your ZIO XT patch monitor. Call them immediately if you see an orange light blinking on your  monitor.  If your monitor falls off in less than 4 days, contact our Monitor department at [phone].  If your monitor becomes loose or falls off after 4 days call [employer] at [phone] for  suggestions on securing your monitor    Follow-Up: At [facility], you and your health needs are our priority.  As part of our continuing mission to provide you with exceptional heart care, we have created designated Provider Care Teams.  These Care Teams include your primary Cardiologist (physician) and Advanced Practice Providers (APPs -  Physician Assistants and Nurse Practitioners) who all work together to provide you with the care you need, when you need it.  We recommend signing up for the patient portal called "MyChart".  Sign up information is provided on this After Visit Summary.  MyChart is used to connect with patients for Virtual Visits (Telemedicine).  Patients are able to view lab/test results, encounter notes, upcoming appointments, etc.  Non-urgent messages can be sent to your provider as well.   To learn more about what you can do with MyChart, go to [email or web address].    Your next appointment:   1 month(s)  The format for your next appointment:   In Person  Provider:  Marjie Skiff PA-C {    Important Information About Sugar         Glenda Lovely, PA-C  10/26/2021  5:55 PM     [facility]

## 2021-10-19 ENCOUNTER — Other Ambulatory Visit: Payer: Self-pay

## 2021-10-19 DIAGNOSIS — E785 Hyperlipidemia, unspecified: Secondary | ICD-10-CM

## 2021-10-19 DIAGNOSIS — Q2112 Patent foramen ovale: Secondary | ICD-10-CM

## 2021-10-19 DIAGNOSIS — R002 Palpitations: Secondary | ICD-10-CM | POA: Diagnosis not present

## 2021-10-19 DIAGNOSIS — R0602 Shortness of breath: Secondary | ICD-10-CM | POA: Diagnosis not present

## 2021-10-19 DIAGNOSIS — I10 Essential (primary) hypertension: Secondary | ICD-10-CM | POA: Diagnosis not present

## 2021-10-19 DIAGNOSIS — R0600 Dyspnea, unspecified: Secondary | ICD-10-CM | POA: Diagnosis not present

## 2021-10-20 LAB — MAGNESIUM: Magnesium: 1.6 mg/dL (ref 1.6–2.3)

## 2021-10-22 ENCOUNTER — Ambulatory Visit (HOSPITAL_COMMUNITY): Payer: Medicare Other | Attending: Cardiology

## 2021-10-22 DIAGNOSIS — I10 Essential (primary) hypertension: Secondary | ICD-10-CM | POA: Insufficient documentation

## 2021-10-22 DIAGNOSIS — Q2112 Patent foramen ovale: Secondary | ICD-10-CM | POA: Insufficient documentation

## 2021-10-22 DIAGNOSIS — R002 Palpitations: Secondary | ICD-10-CM | POA: Diagnosis not present

## 2021-10-22 DIAGNOSIS — E785 Hyperlipidemia, unspecified: Secondary | ICD-10-CM | POA: Insufficient documentation

## 2021-10-22 LAB — ECHOCARDIOGRAM COMPLETE
Area-P 1/2: 1.8 cm2
S' Lateral: 3.6 cm

## 2021-10-25 ENCOUNTER — Encounter: Payer: Self-pay | Admitting: Student

## 2021-10-26 ENCOUNTER — Ambulatory Visit (INDEPENDENT_AMBULATORY_CARE_PROVIDER_SITE_OTHER): Payer: Medicare Other | Admitting: Student

## 2021-10-26 ENCOUNTER — Encounter: Payer: Self-pay | Admitting: Student

## 2021-10-26 ENCOUNTER — Ambulatory Visit: Payer: Medicare Other

## 2021-10-26 VITALS — BP 124/90 | HR 85 | Ht 65.0 in | Wt 216.2 lb

## 2021-10-26 DIAGNOSIS — R61 Generalized hyperhidrosis: Secondary | ICD-10-CM | POA: Diagnosis not present

## 2021-10-26 DIAGNOSIS — R002 Palpitations: Secondary | ICD-10-CM

## 2021-10-26 DIAGNOSIS — I119 Hypertensive heart disease without heart failure: Secondary | ICD-10-CM

## 2021-10-26 DIAGNOSIS — R070 Pain in throat: Secondary | ICD-10-CM

## 2021-10-26 DIAGNOSIS — R0602 Shortness of breath: Secondary | ICD-10-CM | POA: Diagnosis not present

## 2021-10-26 DIAGNOSIS — R0609 Other forms of dyspnea: Secondary | ICD-10-CM | POA: Diagnosis not present

## 2021-10-26 DIAGNOSIS — R55 Syncope and collapse: Secondary | ICD-10-CM

## 2021-10-26 DIAGNOSIS — R42 Dizziness and giddiness: Secondary | ICD-10-CM | POA: Diagnosis not present

## 2021-10-26 DIAGNOSIS — Q2112 Patent foramen ovale: Secondary | ICD-10-CM

## 2021-10-26 DIAGNOSIS — E785 Hyperlipidemia, unspecified: Secondary | ICD-10-CM

## 2021-10-26 DIAGNOSIS — I10 Essential (primary) hypertension: Secondary | ICD-10-CM | POA: Diagnosis not present

## 2021-10-26 MED ORDER — METOPROLOL SUCCINATE ER 25 MG PO TB24: 25.0000 mg | ORAL_TABLET | Freq: Every day | ORAL | 3 refills | Status: DC

## 2021-10-26 MED ORDER — METOPROLOL TARTRATE 100 MG PO TABS: ORAL_TABLET | ORAL | 0 refills | Status: DC

## 2021-10-26 NOTE — Progress Notes (Unsigned)
Enrolled for [employer] to mail a ZIO XT long term holter monitor to the patients address on file.   Dr. Hilty to read. 

## 2021-10-26 NOTE — Patient Instructions (Signed)
Medication Instructions:   STOP CURRENT METOPROLOL  START METOPROLOL SUCC ER 25 MG ONCE DAILY  *If you need a refill on your cardiac medications before your next appointment, please call your pharmacy*   Testing/Procedures:    Your cardiac CT will be scheduled at   [facility] [place], [place] [address] [phone]   If scheduled at [facility], please arrive at the [facility] and Children's Entrance (Entrance C2) of [facility] 30 minutes prior to test start time. You can use the FREE valet parking offered at entrance C (encouraged to control the heart rate for the test)  Proceed to the [facility] Radiology Department (first floor) to check-in and test prep.  All radiology patients and guests should use entrance C2 at [facility], accessed from [facility], even though the hospital's physical address listed is [address]    If scheduled at [facility], please arrive 15 mins early for check-in and test prep.  Please follow these instructions carefully (unless otherwise directed):   On the Night Before the Test: Be sure to Drink plenty of water. Do not consume any caffeinated/decaffeinated beverages or chocolate 12 hours prior to your test. Do not take any antihistamines 12 hours prior to your test.   On the Day of the Test: Drink plenty of water until 1 hour prior to the test. Do not eat any food 4 hours prior to the test. You may take your regular medications prior to the test.  Take metoprolol (Lopressor) 100 mg two hours prior to test. FEMALES- please wear underwire-free bra if available, avoid dresses & tight clothing       After the Test: Drink plenty of water. After receiving IV contrast, you may experience a mild flushed feeling. This is normal. On occasion, you may experience a mild rash up to 24 hours after the test. This is not dangerous. If this occurs,  you can take Benadryl 25 mg and increase your fluid intake. If you experience trouble breathing, this can be serious. If it is severe call 911 IMMEDIATELY. If it is mild, please call our office. If you take any of these medications: Glipizide/Metformin,  please do not take 48 hours after completing test unless otherwise instructed.  We will call to schedule your test 2-4 weeks out understanding that some insurance companies will need an authorization prior to the service being performed.   For non-scheduling related questions, please contact the cardiac imaging nurse navigator should you have any questions/concerns: Marchia Bond, Cardiac Imaging Nurse Navigator Gordy Clement, Cardiac Imaging Nurse Navigator [facility] Direct Office Dial: [phone]   For scheduling needs, including cancellations and rescheduling, please call [place], [phone].    ZIO XT- Long Term Monitor Instructions  Your physician has requested you wear a ZIO patch monitor for 3 days.  This is a single patch monitor. [employer] supplies one patch monitor per enrollment. Additional stickers are not available. Please do not apply patch if you will be having a Nuclear Stress Test,  Echocardiogram, Cardiac CT, MRI, or Chest Xray during the period you would be wearing the  monitor. The patch cannot be worn during these tests. You cannot remove and re-apply the  ZIO XT patch monitor.  Your ZIO patch monitor will be mailed 3 day USPS to your address on file. It may take 3-5 days  to receive your monitor after you have been enrolled.  Once you  have received your monitor, please review the enclosed instructions. Your monitor  has already been registered assigning a specific monitor serial # to you.  Billing and Patient Assistance Program Information  We have supplied [employer] with any of your insurance information on file for billing purposes. [employer] offers a sliding scale Patient Assistance  Program for patients that do not have  insurance, or whose insurance does not completely cover the cost of the ZIO monitor.  You must apply for the Patient Assistance Program to qualify for this discounted rate.  To apply, please call [employer] at [phone], select option 4, select option 2, ask to apply for  Patient Assistance Program. Theodore Demark will ask your household income, and how many people  are in your household. They will quote your out-of-pocket cost based on that information.  [employer] will also be able to set up a 18-month interest-free payment plan if needed.  Applying the monitor   Shave hair from upper left chest.  Hold abrader disc by orange tab. Rub abrader in 40 strokes over the upper left chest as  indicated in your monitor instructions.  Clean area with 4 enclosed alcohol pads. Let dry.  Apply patch as indicated in monitor instructions. Patch will be placed under collarbone on left  side of chest with arrow pointing upward.  Rub patch adhesive wings for 2 minutes. Remove white label marked "1". Remove the white  label marked "2". Rub patch adhesive wings for 2 additional minutes.  While looking in a mirror, press and release button in center of patch. A small green light will  flash 3-4 times. This will be your only indicator that the monitor has been turned on.  Do not shower for the first 24 hours. You may shower after the first 24 hours.  Press the button if you feel a symptom. You will hear a small click. Record Date, Time and  Symptom in the Patient Logbook.  When you are ready to remove the patch, follow instructions on the last 2 pages of Patient  Logbook. Stick patch monitor onto the last page of Patient Logbook.  Place Patient Logbook in the blue and white box. Use locking tab on box and tape box closed  securely. The blue and white box has prepaid postage on it. Please place it in the mailbox as  soon as possible. Your physician should have your test results  approximately 7 days after the  monitor has been [facility]  Call [place] [phone] you have questions regarding  your ZIO XT patch monitor. Call them immediately if you see an orange light blinking on your  monitor.  If your monitor falls off in less than 4 days, contact our Monitor department at [phone]  If your monitor becomes loose or falls off after 4 days call [employer] at [phone]  suggestions on securing your monitor    Follow-Up: [facility] and your [facility] needs are our priority.  As part of our continuing mission to provide you with exceptional heart care, we have created designated Provider Care Teams.  These Care Teams include your primary Cardiologist (physician) and Advanced Practice Providers (APPs -  Physician Assistants and Nurse Practitioners) who all work together to provide you with the care you need, when you need it.  We recommend signing up for the patient portal called "MyChart".  Sign up information is provided on this After Visit Summary.  MyChart is used to connect with patients  for Virtual Visits (Telemedicine).  Patients are able to view lab/test results, encounter notes, upcoming appointments, etc.  Non-urgent messages can be sent to your provider as well.   To learn more about what you can do with MyChart, go to [email or web address].    Your next appointment:   1 month(s)  The format for your next appointment:   In Person  Provider:  Sande Rives PA-C {    Important Information About Sugar

## 2021-10-27 ENCOUNTER — Telehealth (HOSPITAL_COMMUNITY): Payer: Self-pay | Admitting: *Deleted

## 2021-10-27 LAB — PRO B NATRIURETIC PEPTIDE: NT-Pro BNP: 38 pg/mL (ref 0–287)

## 2021-10-27 LAB — MAGNESIUM: Magnesium: 2 mg/dL (ref 1.6–2.3)

## 2021-10-27 NOTE — Telephone Encounter (Signed)
Attempted to call patient regarding upcoming cardiac CT appointment. °Left message on voicemail with name and callback number ° °Kaida Games RN Navigator Cardiac Imaging °[facility] °[phone] Office °[phone] Cell ° °

## 2021-10-27 NOTE — Telephone Encounter (Signed)
Reaching out to patient to offer assistance regarding upcoming cardiac imaging study; pt verbalizes understanding of appt date/time, parking situation and where to check in, pre-test NPO status and medications ordered, and verified current allergies; name and call back number provided for further questions should they arise  Gordy Clement RN Navigator Cardiac Imaging [facility] [phone] office [phone] cell  Patient denies allergy to contrast. She is to take her daily cardizem and '100mg'$  metoprolol tartrate two hours prior to her cardiac CT scan. She is aware to arrive at 9am.

## 2021-10-28 ENCOUNTER — Ambulatory Visit (HOSPITAL_COMMUNITY)
Admission: RE | Admit: 2021-10-28 | Discharge: 2021-10-28 | Disposition: A | Discharge status: Home / Self Care | Payer: Medicare Other | Source: Ambulatory Visit | Attending: Student | Admitting: Student

## 2021-10-28 DIAGNOSIS — E785 Hyperlipidemia, unspecified: Secondary | ICD-10-CM | POA: Diagnosis not present

## 2021-10-28 DIAGNOSIS — R070 Pain in throat: Secondary | ICD-10-CM

## 2021-10-28 DIAGNOSIS — I10 Essential (primary) hypertension: Secondary | ICD-10-CM

## 2021-10-28 DIAGNOSIS — R0602 Shortness of breath: Secondary | ICD-10-CM

## 2021-10-28 DIAGNOSIS — I119 Hypertensive heart disease without heart failure: Secondary | ICD-10-CM | POA: Diagnosis not present

## 2021-10-28 DIAGNOSIS — R002 Palpitations: Secondary | ICD-10-CM | POA: Diagnosis not present

## 2021-10-28 DIAGNOSIS — Q2112 Patent foramen ovale: Secondary | ICD-10-CM | POA: Diagnosis not present

## 2021-10-28 MED ORDER — SODIUM CHLORIDE 0.9 % IV BOLUS
250.0000 mL | Freq: Once | INTRAVENOUS | Status: AC
  Administered 2021-10-28: 250 mL via INTRAVENOUS

## 2021-10-28 MED ORDER — IOHEXOL 350 MG/ML SOLN
100.0000 mL | Freq: Once | INTRAVENOUS | Status: AC | PRN
  Administered 2021-10-28: 100 mL via INTRAVENOUS

## 2021-10-28 MED ORDER — NITROGLYCERIN 0.4 MG SL SUBL
SUBLINGUAL_TABLET | SUBLINGUAL | Status: AC
  Filled 2021-10-28: qty 2

## 2021-10-28 MED ORDER — SODIUM CHLORIDE 0.9 % IV BOLUS
250.0000 mL | Freq: Once | INTRAVENOUS | Status: AC
Start: 2021-10-28 — End: 2021-10-28
  Administered 2021-10-28: 250 mL via INTRAVENOUS

## 2021-10-28 MED ORDER — NITROGLYCERIN 0.4 MG SL SUBL
0.8000 mg | SUBLINGUAL_TABLET | Freq: Once | SUBLINGUAL | Status: AC
  Administered 2021-10-28: 0.8 mg via SUBLINGUAL

## 2021-10-28 MED ORDER — SODIUM CHLORIDE 0.9 % IV BOLUS
500.0000 mL | Freq: Once | INTRAVENOUS | Status: AC
  Administered 2021-10-28: 500 mL via INTRAVENOUS

## 2021-10-31 ENCOUNTER — Other Ambulatory Visit: Payer: Self-pay | Admitting: Student

## 2021-11-02 DIAGNOSIS — Z961 Presence of intraocular lens: Secondary | ICD-10-CM | POA: Diagnosis not present

## 2021-11-08 NOTE — Telephone Encounter (Signed)
Noted! Thank you

## 2021-11-11 ENCOUNTER — Other Ambulatory Visit: Payer: Self-pay | Admitting: Family Medicine

## 2021-11-11 DIAGNOSIS — E039 Hypothyroidism, unspecified: Secondary | ICD-10-CM | POA: Diagnosis not present

## 2021-11-11 DIAGNOSIS — W57XXXA Bitten or stung by nonvenomous insect and other nonvenomous arthropods, initial encounter: Secondary | ICD-10-CM | POA: Diagnosis not present

## 2021-11-11 DIAGNOSIS — R0989 Other specified symptoms and signs involving the circulatory and respiratory systems: Secondary | ICD-10-CM

## 2021-11-11 DIAGNOSIS — S80861A Insect bite (nonvenomous), right lower leg, initial encounter: Secondary | ICD-10-CM | POA: Diagnosis not present

## 2021-11-11 DIAGNOSIS — K219 Gastro-esophageal reflux disease without esophagitis: Secondary | ICD-10-CM | POA: Diagnosis not present

## 2021-11-11 DIAGNOSIS — F419 Anxiety disorder, unspecified: Secondary | ICD-10-CM | POA: Diagnosis not present

## 2021-11-13 ENCOUNTER — Ambulatory Visit
Admission: RE | Admit: 2021-11-13 | Discharge: 2021-11-13 | Disposition: A | Discharge status: Home / Self Care | Payer: Medicare Other | Source: Ambulatory Visit | Attending: Family Medicine | Admitting: Family Medicine

## 2021-11-13 DIAGNOSIS — E039 Hypothyroidism, unspecified: Secondary | ICD-10-CM

## 2021-11-13 DIAGNOSIS — R1310 Dysphagia, unspecified: Secondary | ICD-10-CM | POA: Diagnosis not present

## 2021-11-13 DIAGNOSIS — R0989 Other specified symptoms and signs involving the circulatory and respiratory systems: Secondary | ICD-10-CM

## 2021-11-27 DIAGNOSIS — L814 Other melanin hyperpigmentation: Secondary | ICD-10-CM | POA: Diagnosis not present

## 2021-11-27 DIAGNOSIS — L820 Inflamed seborrheic keratosis: Secondary | ICD-10-CM | POA: Diagnosis not present

## 2021-11-27 DIAGNOSIS — D225 Melanocytic nevi of trunk: Secondary | ICD-10-CM | POA: Diagnosis not present

## 2021-11-27 DIAGNOSIS — L821 Other seborrheic keratosis: Secondary | ICD-10-CM | POA: Diagnosis not present

## 2021-11-27 DIAGNOSIS — L738 Other specified follicular disorders: Secondary | ICD-10-CM | POA: Diagnosis not present

## 2021-11-27 DIAGNOSIS — D1801 Hemangioma of skin and subcutaneous tissue: Secondary | ICD-10-CM | POA: Diagnosis not present

## 2021-12-03 ENCOUNTER — Ambulatory Visit: Payer: Medicare Other | Admitting: Student

## 2021-12-03 DIAGNOSIS — E7800 Pure hypercholesterolemia, unspecified: Secondary | ICD-10-CM | POA: Diagnosis not present

## 2021-12-03 DIAGNOSIS — J4520 Mild intermittent asthma, uncomplicated: Secondary | ICD-10-CM | POA: Diagnosis not present

## 2021-12-03 DIAGNOSIS — E119 Type 2 diabetes mellitus without complications: Secondary | ICD-10-CM | POA: Diagnosis not present

## 2021-12-03 DIAGNOSIS — R221 Localized swelling, mass and lump, neck: Secondary | ICD-10-CM | POA: Diagnosis not present

## 2021-12-03 DIAGNOSIS — F324 Major depressive disorder, single episode, in partial remission: Secondary | ICD-10-CM | POA: Diagnosis not present

## 2021-12-03 DIAGNOSIS — E039 Hypothyroidism, unspecified: Secondary | ICD-10-CM | POA: Diagnosis not present

## 2021-12-03 DIAGNOSIS — M797 Fibromyalgia: Secondary | ICD-10-CM | POA: Diagnosis not present

## 2021-12-03 DIAGNOSIS — K219 Gastro-esophageal reflux disease without esophagitis: Secondary | ICD-10-CM | POA: Diagnosis not present

## 2021-12-03 DIAGNOSIS — I10 Essential (primary) hypertension: Secondary | ICD-10-CM | POA: Diagnosis not present

## 2021-12-10 DIAGNOSIS — R519 Headache, unspecified: Secondary | ICD-10-CM | POA: Diagnosis not present

## 2021-12-10 DIAGNOSIS — R0981 Nasal congestion: Secondary | ICD-10-CM | POA: Diagnosis not present

## 2021-12-10 DIAGNOSIS — R509 Fever, unspecified: Secondary | ICD-10-CM | POA: Diagnosis not present

## 2021-12-10 DIAGNOSIS — R051 Acute cough: Secondary | ICD-10-CM | POA: Diagnosis not present

## 2021-12-22 DIAGNOSIS — E119 Type 2 diabetes mellitus without complications: Secondary | ICD-10-CM | POA: Diagnosis not present

## 2021-12-22 DIAGNOSIS — E7800 Pure hypercholesterolemia, unspecified: Secondary | ICD-10-CM | POA: Diagnosis not present

## 2022-01-01 DIAGNOSIS — H35033 Hypertensive retinopathy, bilateral: Secondary | ICD-10-CM | POA: Diagnosis not present

## 2022-01-01 DIAGNOSIS — H353132 Nonexudative age-related macular degeneration, bilateral, intermediate dry stage: Secondary | ICD-10-CM | POA: Diagnosis not present

## 2022-01-01 DIAGNOSIS — H43811 Vitreous degeneration, right eye: Secondary | ICD-10-CM | POA: Diagnosis not present

## 2022-01-01 DIAGNOSIS — H43822 Vitreomacular adhesion, left eye: Secondary | ICD-10-CM | POA: Diagnosis not present

## 2022-02-16 ENCOUNTER — Other Ambulatory Visit: Payer: Self-pay | Admitting: Obstetrics and Gynecology

## 2022-02-16 DIAGNOSIS — Z1231 Encounter for screening mammogram for malignant neoplasm of breast: Secondary | ICD-10-CM

## 2022-03-22 ENCOUNTER — Encounter: Payer: Self-pay | Admitting: Internal Medicine

## 2022-03-22 ENCOUNTER — Ambulatory Visit: Payer: Medicare Other | Attending: Internal Medicine | Admitting: Internal Medicine

## 2022-03-22 VITALS — BP 132/88 | HR 80 | Ht 65.0 in | Wt 218.0 lb

## 2022-03-22 DIAGNOSIS — I10 Essential (primary) hypertension: Secondary | ICD-10-CM | POA: Diagnosis not present

## 2022-03-22 DIAGNOSIS — Z8774 Personal history of (corrected) congenital malformations of heart and circulatory system: Secondary | ICD-10-CM | POA: Diagnosis not present

## 2022-03-22 DIAGNOSIS — E785 Hyperlipidemia, unspecified: Secondary | ICD-10-CM | POA: Insufficient documentation

## 2022-03-22 NOTE — Patient Instructions (Signed)
Medication Instructions:  No Changes In Medications at this time.  *If you need a refill on your cardiac medications before your next appointment, please call your pharmacy*  Lab Work: None Ordered At This Time.  If you have labs (blood work) drawn today and your tests are completely normal, you will receive your results only by: [place] (if you have MyChart) OR A paper copy in the mail If you have any lab test that is abnormal or we need to change your treatment, we will call you to review the results.  Testing/Procedures: None Ordered At This Time.   Follow-Up: At [facility], you and your health needs are our priority.  As part of our continuing mission to provide you with exceptional heart care, we have created designated Provider Care Teams.  These Care Teams include your primary Cardiologist (physician) and Advanced Practice Providers (APPs -  Physician Assistants and Nurse Practitioners) who all work together to provide you with the care you need, when you need it.  Your next appointment:   1 year(s)  The format for your next appointment:   In Person  Provider:   Pixie Casino, MD

## 2022-03-22 NOTE — Progress Notes (Signed)
OFFICE NOTE  Chief Complaint:  Follow-up  Primary Care Physician: Shirline Frees, MD  HPI:  Glenda Evans is a 66 y.o. female with a past medical history significant for some anxiety, fibromyalgia, hypertension, palpitations and hypothyroidism. She initially had evaluation of her palpitations in the early 2000's by Dr. Pernell Dupre. At the time she wore monitor, had a stress test and echocardiogram. The echocardiogram suggested mitral valve prolapse. Her monitor failed to show clear etiology of her symptoms. She does have a history of PVCs on her chart however those were not noted by EKG today. Recently she said she's had some increasing burden of palpitations. She denies any worsening stress. She also has sharp chest discomfort. She feels like this is different than her fibromyalgia, however it was reproduced during auscultation with my stethoscope today. She's had close to 20 pound weight gain over the past several months due to some orthopedic problems in her feet which she says is decreased her exercise. This certainly could explain some of her shortness of breath. She feels like the episodes of palpitations that occur very infrequently are paroxysmal. Oftentimes include heart racing and then can be followed by feelings of significant fatigue which is worse than her daily chronic fatigue.  09/17/2015  Glenda Evans returns today for follow-up. She wore a monitor which demonstrated PACs and has had some PVCs. Her echocardiogram showed normal systolic function with mild diastolic dysfunction. There was no evidence of mitral valve prolapse that was diagnostically significant. We talked about possible medication changes to try to help with her palpitations. She does note that she has significant side effects from verapamil including possible constipation.   12/26/2015  Glenda Evans returns today for follow-up. She reports marked improvement in her palpitations with adjustment in her medications.  Blood pressure is excellent today at 120/80. She denies any chest pain or worsening shortness of breath. She continues to be fatigued. Sleep is poor at night and generally she is quite awake. Occasionally she gas for breath but does not report heavy snoring. She naps a lot during the day.  05/14/2016  Glenda Evans reports improvement in her palpitations on diltiazem and she is pleased with good blood pressure control. She continues to struggle with daytime somnolence and insomnia at night. She manages only to get a couple hours of sleep at night but generally can sleep 6-8 hours during the day. He feels chronically fatigued. She underwent a sleep study which was negative for obstructive sleep apnea however might of been concern for either restless leg syndrome which she vehemently denies or perhaps narcolepsy disorder. Dr. Claiborne Billings ordered and an MSLT test. This is pending. She does use clonazepam to try to help with sleep and anxiety at night but she says is rarely effective. Her main issues not being able to turn her brain off at night.  12/16/2016  Glenda Evans was seen today in follow-up. Recently she's been concerned about leg pain and swelling. She did have a sleep study which was negative for sleep apnea. She reports her palpitations have improved on diltiazem. She does carry a diagnosis of fibromyalgia may have chronic fatigue syndrome. In addition she has some degree of neuropathy but has been intolerant to neuropathic pain medicines. She reports shooting pain down both legs. Seems to be worse in the right. There is some associated swelling. It's worse when she walks and gets better when she rests. She reports some cold feet. She's concerned as her husband recently had popliteal aneurysms and  ultimately had to have amputation of one of his legs. She's also concerned about same prominent veins as it may be related to varicose veins. She wonders if she may have had a blood clot.  06/22/2018  Glenda Evans  is seen today in routine follow-up.  Unfortunately recently she had a stroke when she was visiting a family member [facility].  She was under significant stress and ended up being hospitalized she has had for 2 weeks.  She underwent various imaging studies and was felt that her symptoms were due to stress.  She does have a history of elevated cholesterol and most recently her lipid profile in February 2019 did show a direct LDL 69 and triglycerides of 450.  She had previously been on WelChol but was not on statin therapy.  She did see Dr. Leonie Man with [facility] neurology who recommended her starting on atorvastatin 20 mg daily.  Currently however she is not on that medication has not been on it for several months because she was not sure why she was taking it.  Her last lipid profile was in March 2019 again showed a total cholesterol 184, HDL 41, LDL 91 and triglycerides of 260.  Blood pressure today was 132/80.  She has managed to lose about 8 pounds and is working on dietary modification.  Recently, she was also diagnosed with type 2 diabetes and her hemoglobin A1c was over 6.8.  She started on metformin which has helped her sugars and also surprisingly helped symptoms of leg pain which she had.  As part of her work-up she did have MRI/MRA.  This did demonstrate a bovine configuration of her aortic arch but no evidence of carotid artery disease, stenosis, aneurysm or vascular occlusion.  Surprisingly, I did locate an echocardiogram performed on 225/2019, which shows an LVEF of 50 to 55%.  If one looks really closely a bubble study was performed and under the right atrial tab it does indicate there was a right to left interatrial shunt after injection of agitated saline contrast.  This suggest that a PFO is noted.  I was able to locate the neurology consult note which indicated positive PFO but negative Dopplers for DVT.  Transcranial Doppler indicated normal intracranial velocities.  There was no suggestion of  pursuing the PFO as a cause of TIA, since LE venous dopplers were performed and were negative for DVT.  12/06/2018  Mrs. Taniguchi is seen today in follow-up.  She was supposed to undergo TEE to evaluate for PFO however with COVID this was delayed.  She indicates that she is interested in pursuing this.  Unfortunately she has not been compliant with aspirin.  I asked her to restart that today.  Otherwise she is asymptomatic.  She denies any chest pain or worsening shortness of breath.  She has had no further TIAs but does get some occasional dizzy episodes which she thinks could be related.  EKG is normal today.  Blood pressure is normal as well.  07/25/2019  Mrs. Laskin is seen today for follow-up.  She underwent successful closure of her PFO and there was no residual shunting.  Unfortunately, she subsequently developed some inappropriate tachycardia and flushing.  She was seen by our pharmacist Marcelle Overlie, who uptitrated her calcium channel blocker and sent a number of labs including work-up for pheochromocytoma.  Not surprisingly this was negative.  Over a short period of time her heart rate has come down.  She is also been struggling with possible shingles or some  type of neuropathic pain in her upper chest that started under her right armpit and extends under the right breast.  She has been on Valtrex and gabapentin without much relief.   06/16/2020  Mrs. Hett returns today for follow-up.  Unfortunately she has had a very difficult past several months.  In the fall she contracted COVID-19.  She ended up getting antibody therapies for it.  She was unvaccinated.  Her husband, who is a veteran was also apparently unvaccinated and had previously approached his PCP who did not encourage the vaccine.  Subsequently he contracted COVID-19 and unfortunately died 10 days later of the virus.  She struggled as well and had been hospitalized 3 different times with symptoms related to that.  More recently she  has been placed on a Breo inhaler and finally has improvement in her symptoms.  She says she is just about back to baseline at this point.  03/22/2022  Ms. Rightmyer is seen today in follow-up.  She reports she is feeling very well.  She had previously seen one of our APP's who ordered CT coronary angiography on her.  This was negative for calcium or any coronary disease.  It also demonstrated the Amplatzer PFO closure device in place.  No residual shunting was noted.  PMHx:  Past Medical History:  Diagnosis Date   Allergy    Anxiety    Arthritis    Asthma    Cataract    right eye    Depression    Fatigue    Fibromyalgia    GERD (gastroesophageal reflux disease)    Heart murmur    HLD (hyperlipidemia)    Hypertension    Hypothyroidism    IBS (irritable bowel syndrome)    Insomnia    Migraine    Mitral valve prolapse    Osteopenia    Pre-diabetes    PVC (premature ventricular contraction)    Stein-Leventhal syndrome    Wears glasses     Past Surgical History:  Procedure Laterality Date   ABDOMINAL HYSTERECTOMY     BUBBLE STUDY  01/04/2019   Procedure: BUBBLE STUDY;  Surgeon: Pixie Casino, MD;  Location: [place];  Service: Cardiovascular;;   CESAREAN SECTION     x 2   CHOLECYSTECTOMY     COLONOSCOPY     LIPOMA EXCISION  11/26/10   thigh x3 cyst   OVARY SURGERY     to poke holes in ovaries for PCOD   PATENT FORAMEN OVALE(PFO) CLOSURE N/A 02/15/2019   Procedure: PATENT FORAMEN OVALE (PFO) CLOSURE;  Surgeon: Sherren Mocha, MD;  Location: [place] [facility];  Service: Cardiovascular;  Laterality: N/A;   TEE WITHOUT CARDIOVERSION N/A 01/04/2019   Procedure: TRANSESOPHAGEAL ECHOCARDIOGRAM (TEE);  Surgeon: Pixie Casino, MD;  Location: [facility] ENDOSCOPY;  Service: Cardiovascular;  Laterality: N/A;   TONSILLECTOMY      FAMHx:  Family History  Problem Relation Age of Onset   COPD Father    Hypertension Father    Colon polyps Father    Diabetes Sister    Colon  polyps Mother    Heart disease Mother        MVP   Breast cancer Maternal Grandmother    Colon polyps Maternal Grandmother    Diabetes Maternal Grandmother    Heart disease Maternal Grandmother    COPD Maternal Grandmother    Ovarian cancer Paternal Aunt    Colon polyps Sister    Colon polyps Maternal Aunt  x 2   Diabetes Maternal Aunt    Heart disease Sister        MVP   COPD Maternal Aunt    Colon cancer Neg Hx    Esophageal cancer Neg Hx    Rectal cancer Neg Hx    Stomach cancer Neg Hx     SOCHx:   reports that she has never smoked. She has never used smokeless tobacco. She reports that she does not drink alcohol and does not use drugs.  ALLERGIES:  Allergies  Allergen Reactions   Carvedilol Shortness Of Breath    Felt like she had cotton in her throat- felt like it was hard to breath   Erythromycin Shortness Of Breath and Swelling   Lyrica [Pregabalin] Shortness Of Breath and Swelling   Metaxalone Shortness Of Breath   Nitrofurantoin Macrocrystal Swelling and Shortness Of Breath   Ofloxacin Shortness Of Breath and Swelling   Pamelor Shortness Of Breath and Swelling   Prednisone Shortness Of Breath and Swelling   Relafen [Nabumetone] Shortness Of Breath and Swelling   Septra [Bactrim] Shortness Of Breath and Swelling   Sulfasalazine Shortness Of Breath   Escitalopram Oxalate Other (See Comments)    Unsure of exact reaction type   Nitrofurantoin Other (See Comments)   Nortriptyline Other (See Comments)   Sulfa Antibiotics Other (See Comments)   Sulfamethoxazole-Trimethoprim Other (See Comments)   Tricyclic Antidepressants Other (See Comments)    ROS: Pertinent items noted in HPI and remainder of comprehensive ROS otherwise negative.  HOME MEDS: Current Outpatient Medications  Medication Sig Dispense Refill   Accu-Chek FastClix Lancets MISC FOR USE WHEN CHECKING BLOODSUGARS AS DIRECTED ONCE DAILY DX E11.9 102 DAYS     ACCU-CHEK GUIDE test strip USE AS  DIRECTED ONCE A DAY     albuterol (PROVENTIL HFA;VENTOLIN HFA) 108 (90 BASE) MCG/ACT inhaler Inhale 2 puffs into the lungs every 6 (six) hours as needed for wheezing or shortness of breath.      aspirin EC 81 MG tablet Take 81 mg by mouth daily.     atorvastatin (LIPITOR) 40 MG tablet TAKE 1 TABLET (40 MG TOTAL) BY MOUTH DAILY AT 6 PM. 90 tablet 3   Black Cohosh 40 MG CAPS Take 40 mg by mouth daily.      diltiazem (CARDIZEM CD) 360 MG 24 hr capsule TAKE 1 CAPSULE BY MOUTH EVERY DAY 90 capsule 3   levothyroxine (SYNTHROID, LEVOTHROID) 125 MCG tablet Take 150 mcg by mouth daily before breakfast. TOTAL 150 MG     loratadine (CLARITIN) 10 MG tablet Take 10 mg by mouth daily as needed for allergies.      losartan (COZAAR) 100 MG tablet TAKE 1 TABLET BY MOUTH EVERY DAY 90 tablet 1   metFORMIN (GLUCOPHAGE-XR) 500 MG 24 hr tablet Take 500 mg by mouth daily.      metoprolol succinate (TOPROL XL) 25 MG 24 hr tablet Take 1 tablet (25 mg total) by mouth daily. 90 tablet 3   metoprolol tartrate (LOPRESSOR) 100 MG tablet Take 2 hours prior to CT scan 1 tablet 0   Multiple Vitamin (MULTIVITAMIN WITH MINERALS) TABS tablet Take 1 tablet by mouth daily. Vita Fusion     Nutritional Supplements (ESTROVEN PO) Take 1 tablet by mouth daily.      Potassium Chloride ER 20 MEQ TBCR TAKE 1 TABLET BY MOUTH EVERY DAY 90 tablet 3   Vitamin D, Ergocalciferol, (DRISDOL) 1.25 MG (50000 UNIT) CAPS capsule Take 50,000 Units by mouth once a  week.     magnesium oxide (MAG-OX) 400 (240 Mg) MG tablet TAKE 1 TABLET BY MOUTH EVERY DAY (Patient not taking: Reported on 03/22/2022) 30 tablet 1   Current Facility-Administered Medications  Medication Dose Route Frequency Provider Last Rate Last Admin   triamcinolone acetonide (KENALOG) 10 MG/ML injection 10 mg  10 mg Other Once Landis Martins, DPM        LABS/IMAGING: No results found for this or any previous visit (from the past 48 hour(s)). No results found.  WEIGHTS: Wt Readings  from Last 3 Encounters:  03/22/22 218 lb (98.9 kg)  10/26/21 216 lb 3.2 oz (98.1 kg)  10/08/21 217 lb (98.4 kg)    VITALS: BP 132/88 (BP Location: Left Arm, Patient Position: Sitting, Cuff Size: Large)   Pulse 80   Ht '5\' 5"'$  (1.651 m)   Wt 218 lb (98.9 kg)   SpO2 96%   BMI 36.28 kg/m   EXAM: General appearance: alert and no distress Neck: no carotid bruit and no JVD Lungs: clear to auscultation bilaterally Heart: regular rate and rhythm, S1, S2 normal, no murmur, click, rub or gallop Abdomen: soft, non-tender; bowel sounds normal; no masses,  no organomegaly Extremities: extremities normal, atraumatic, no cyanosis or edema and varicose veins noted Pulses: 2+ and symmetric Skin: Skin color, texture, turgor normal. No rashes or lesions Neurologic: Grossly normal Psych: Moderately anxious  EKG: Deferred  ASSESSMENT: Inappropriate tachycardia-resolved Recurrent TIA without imaging evidence of stroke PFO with right to left shunt Low normal LVEF 50 to 55% (2019)-improved to 55 to 60% (10/2021) Bovine aortic arch Bilateral leg pain and swelling Palpitations - PAC's and PVC's (improved on diltiazem) Dyspnea on exertion - normal systolic function and mild diastolic dysfunction Essential hypertension Atypical chest wall pain-likely related to fibromyalgia Hypersomnolence  PLAN: 1.   Mrs. Reasner is to be doing well without any chest pain or worsening shortness of breath.  Recent CT coronary angiography was low risk with 0 coronary calcium and no evidence of coronary artery disease.  Her PFO closure device was in place without residual shunting.  Her most recent echo in July 2023 showed normal LVEF with stable PFO closure.  A long-term monitor was placed for palpitations however she had difficulty with that it was never completed.  Overall she has no complaints.  Will plan to follow-up annually or sooner as necessary.  Pixie Casino, MD, [facility], [place] Director of the [facility]  [facility] Diplomate of the American Board of Clinical Lipidology Attending Cardiologist  Direct Dial: [phone]  Fax: [phone]  Website:  [email or web address] Orbie Grupe 03/22/2022, 11:37 AM

## 2022-04-14 DIAGNOSIS — J111 Influenza due to unidentified influenza virus with other respiratory manifestations: Secondary | ICD-10-CM | POA: Diagnosis not present

## 2022-04-14 DIAGNOSIS — R059 Cough, unspecified: Secondary | ICD-10-CM | POA: Diagnosis not present

## 2022-04-16 ENCOUNTER — Ambulatory Visit

## 2022-06-16 ENCOUNTER — Ambulatory Visit
Admission: RE | Admit: 2022-06-16 | Discharge: 2022-06-16 | Disposition: A | Discharge status: Home / Self Care | Payer: Medicare Other | Source: Ambulatory Visit | Attending: Obstetrics and Gynecology | Admitting: Obstetrics and Gynecology

## 2022-06-16 DIAGNOSIS — Z1231 Encounter for screening mammogram for malignant neoplasm of breast: Secondary | ICD-10-CM

## 2022-06-22 DIAGNOSIS — E119 Type 2 diabetes mellitus without complications: Secondary | ICD-10-CM | POA: Diagnosis not present

## 2022-06-22 DIAGNOSIS — K219 Gastro-esophageal reflux disease without esophagitis: Secondary | ICD-10-CM | POA: Diagnosis not present

## 2022-06-22 DIAGNOSIS — I10 Essential (primary) hypertension: Secondary | ICD-10-CM | POA: Diagnosis not present

## 2022-06-22 DIAGNOSIS — J4520 Mild intermittent asthma, uncomplicated: Secondary | ICD-10-CM | POA: Diagnosis not present

## 2022-06-22 DIAGNOSIS — E039 Hypothyroidism, unspecified: Secondary | ICD-10-CM | POA: Diagnosis not present

## 2022-06-22 DIAGNOSIS — E559 Vitamin D deficiency, unspecified: Secondary | ICD-10-CM | POA: Diagnosis not present

## 2022-06-22 DIAGNOSIS — G2581 Restless legs syndrome: Secondary | ICD-10-CM | POA: Diagnosis not present

## 2022-06-22 DIAGNOSIS — E7800 Pure hypercholesterolemia, unspecified: Secondary | ICD-10-CM | POA: Diagnosis not present

## 2022-06-22 DIAGNOSIS — M797 Fibromyalgia: Secondary | ICD-10-CM | POA: Diagnosis not present

## 2022-06-22 DIAGNOSIS — F324 Major depressive disorder, single episode, in partial remission: Secondary | ICD-10-CM | POA: Diagnosis not present

## 2022-08-24 ENCOUNTER — Telehealth: Payer: Self-pay

## 2022-08-24 DIAGNOSIS — E038 Other specified hypothyroidism: Secondary | ICD-10-CM

## 2022-08-24 NOTE — Telephone Encounter (Signed)
Orders Placed This Encounter  Procedures   TSH    Standing Status:   Future    Standing Expiration Date:   02/24/2023   T4, free    Standing Status:   Future    Standing Expiration Date:   02/24/2023

## 2022-08-26 ENCOUNTER — Other Ambulatory Visit

## 2022-08-27 ENCOUNTER — Encounter: Payer: Self-pay | Admitting: Internal Medicine

## 2022-08-27 ENCOUNTER — Ambulatory Visit (INDEPENDENT_AMBULATORY_CARE_PROVIDER_SITE_OTHER): Payer: Medicare Other | Admitting: Internal Medicine

## 2022-08-27 VITALS — BP 120/70 | HR 76 | Ht 65.5 in | Wt 225.6 lb

## 2022-08-27 DIAGNOSIS — E038 Other specified hypothyroidism: Secondary | ICD-10-CM

## 2022-08-27 DIAGNOSIS — E063 Autoimmune thyroiditis: Secondary | ICD-10-CM | POA: Diagnosis not present

## 2022-08-27 LAB — T4, FREE: Free T4: 1.19 ng/dL (ref 0.60–1.60)

## 2022-08-27 LAB — T3, FREE: T3, Free: 3.5 pg/mL (ref 2.3–4.2)

## 2022-08-27 LAB — TSH: TSH: 0.36 u[IU]/mL (ref 0.35–5.50)

## 2022-08-27 NOTE — Patient Instructions (Addendum)
Please continue levothyroxine 125 mcg daily.  Take the thyroid hormone every day, with water, at least 30 minutes before breakfast, separated by at least 4 hours from: - acid reflux medications - calcium - iron - multivitamins  Please move the multivitamins at least 4h after Levothyroxine.  Please stop at the lab.  Please return for a follow-up appointment in 6 months.

## 2022-08-27 NOTE — Progress Notes (Signed)
Patient ID: Glenda Evans, female   DOB: [birth year], 67 y.o.   MRN: [id number]  HPI  Glenda Evans is a 67 y.o.-year-old female, referred by her PCP, Dr. Tiburcio Pea, for management of Hashimoto's hypothyroidism.  She is a cousin of Glenda Evans, who is also my patient.  Pt. has been dx with hypothyroidism at 67 y/o (1976): hair loss, dysphagia.  She has been on brand-name and generic levothyroxine since then.  Pt. is on levothyroxine 125 mcg daily, taken: - in am - fasting - at least 1h from b'fast - no calcium - no iron - + multivitamins: 1-2h later - + PPIs - Nexium - in the evening - not on Biotin  I reviewed pt's thyroid tests: 06/22/2022: TSH >4 (reportedly, above target, admits to skipping few doses of LT4) Lab Results  Component Value Date   TSH 2.390 10/08/2021   TSH 1.420 05/24/2019   TSH 1.579  11/09/2006   Antithyroid antibodies: No results found for: "THGAB" No components found for: "TPOAB" However, she was told at diagnosis that she had Hashimoto's thyroiditis.  Pt describes: - fatigue - weight gain - hair loss  No: - cold intolerance - constipation - dry skin  Pt denies: - feeling nodules in neck - hoarseness - SOB with lying down However, she has had dysphagia and choking -currently resolved.    Reviewed previous investigation: Barium swallow (10/22/2015): Fluoroscopic evaluation of swallowing demonstrates disruption of 2 out of 3 primary esophageal peristaltic waves. No fixed stricture, fold thickening or mass. Small hiatal hernia. The patient swallowed a 13 mm barium tablet which freely passed into the stomach. No reflux with the water siphon maneuver.   IMPRESSION: Nonspecific esophageal motility disorder. Small hiatal hernia.  Swallow evaluation (10/22/2015): CH[facility] IP CLINICAL IMPRESSIONS 10/22/2015  Therapy Diagnosis Mild pharyngeal phase dysphagia  Clinical Impression Pt presents with mild pharyngeal dysphagia, characterized by delayed swallow  reflex due to decreased sensation for primary trigger site. Trigger was noted to occur at the level of the pyriform on thin liquids, but was timely for other consistencies. No penetration, aspiration, or post-swalow residue was noted on this study.  Regular Barium Swallow was completed following the MBS, and a hiatal hernia was identified. For this reason, pt was given information regarding behavioral and dietary strategies for management of esophageal dysmotility. No further ST intervenrion is recommended at htis time. Please reconsult if needs arise.  Impact on safety and function Mild aspiration risk   EGD (10/28/2015): Surgical [P], gastric body and gastric polyp - BENIGN GASTRIC MUCOSA AND BENIGN FUNDIC GLAND POLYP. - WARTHIN-STARRY STAIN NEGATIVE FOR HELICOBACTER PYLORI. - NO INTESTINAL METAPLASIA, DYSPLASIA OR MALIGNANCY.  Thyroid ultrasound (11/13/2021): Parenchymal Echotexture: Mildly heterogenous  Isthmus: 1 mm  Right lobe: 3.3 x 0.7 x 0.9 cm  Left lobe: 3.2 x 0.5 x 0.9 cm  _________________________________________________________   Estimated total number of nodules >/= 1 cm: 0 _________________________________________________________   Atrophic mildly heterogeneous thyroid. Negative for nodule. No hypervascularity or regional adenopathy.   IMPRESSION: Atrophic heterogeneous thyroid gland compatible with sequelae from chronic medical thyroid disease. Negative for nodule.  She has + FH of thyroid disorders in: mother - on LT4 25 mcg, MGM, M aunt. No FH of thyroid cancer. No h/o radiation tx to head or neck. No recent use of iodine supplements.  No herbal supplements.  No recent steroid use - she is allergic to Prednisone.  Pt. also has a history of diabetes (well-controlled/managed by PCP), hyperlipidemia, hypertension, vitamin D deficiency, obesity,  GERD, IBS, fibromyalgia, depression/anxiety, insomnia, migraine.  She also has mitral valve prolapse - sees Dr. Rennis Golden. She has  shingles neuropathy >> started Valtrex. She has an infected tooth >> on Clindamycin.  ROS: + see HPI Also: + Acid reflux, palpitations, shortness of breath, swelling in hands and feet  Reviewed: Past Medical History:  Diagnosis Date   Allergy    Anxiety    Arthritis    Asthma    Cataract    right eye    Depression    Fatigue    Fibromyalgia    GERD (gastroesophageal reflux disease)    Heart murmur    HLD (hyperlipidemia)    Hypertension    Hypothyroidism    IBS (irritable bowel syndrome)    Insomnia    Migraine    Mitral valve prolapse    Osteopenia    Pre-diabetes    PVC (premature ventricular contraction)    Stein-Leventhal syndrome    Wears glasses    Past Surgical History:  Procedure Laterality Date   ABDOMINAL HYSTERECTOMY     BUBBLE STUDY  01/04/2019   Procedure: BUBBLE STUDY;  Surgeon: Chrystie Nose, MD;  Location: [facility] ENDOSCOPY;  Service: Cardiovascular;;   CATARACT EXTRACTION Bilateral 2023   CESAREAN SECTION     x 2   CHOLECYSTECTOMY     COLONOSCOPY     LIPOMA EXCISION  11/26/2010   thigh x3 cyst   OVARY SURGERY     to poke holes in ovaries for PCOD   PATENT FORAMEN OVALE(PFO) CLOSURE N/A 02/15/2019   Procedure: PATENT FORAMEN OVALE (PFO) CLOSURE;  Surgeon: Tonny Bollman, MD;  Location: [facility] INVASIVE CV LAB;  Service: Cardiovascular;  Laterality: N/A;   TEE WITHOUT CARDIOVERSION N/A 01/04/2019   Procedure: TRANSESOPHAGEAL ECHOCARDIOGRAM (TEE);  Surgeon: Chrystie Nose, MD;  Location: [facility] ENDOSCOPY;  Service: Cardiovascular;  Laterality: N/A;   TONSILLECTOMY     Social History   Socioeconomic History   Marital status: Divorced    Spouse name: Not on file   Number of children: 2   Years of education: Not on file   Highest education level: Not on file  Occupational History   Occupation: disabled   Occupation: retired  Tobacco Use   Smoking status: Never   Smokeless tobacco: Never  Substance and Sexual Activity   Alcohol use: No     Alcohol/week: 0.0 standard drinks of alcohol   Drug use: No   Sexual activity: Not on file  Other Topics Concern   Not on file  Social History Narrative   Not on file   Social Determinants of Health   Financial Resource Strain: Not on file  Food Insecurity: Not on file  Transportation Needs: Not on file  Physical Activity: Not on file  Stress: Not on file  Social Connections: Not on file  Intimate Partner Violence: Not on file   Current Outpatient Medications on File Prior to Visit  Medication Sig Dispense Refill   Accu-Chek FastClix Lancets MISC FOR USE WHEN CHECKING BLOODSUGARS AS DIRECTED ONCE DAILY DX E11.9 102 DAYS     ACCU-CHEK GUIDE test strip USE AS DIRECTED ONCE A DAY     albuterol (PROVENTIL HFA;VENTOLIN HFA) 108 (90 BASE) MCG/ACT inhaler Inhale 2 puffs into the lungs every 6 (six) hours as needed for wheezing or shortness of breath.      aspirin EC 81 MG tablet Take 81 mg by mouth daily.     atorvastatin (LIPITOR) 40 MG tablet TAKE 1 TABLET (40  MG TOTAL) BY MOUTH DAILY AT 6 PM. 90 tablet 3   Black Cohosh 40 MG CAPS Take 40 mg by mouth daily.      diltiazem (CARDIZEM CD) 360 MG 24 hr capsule TAKE 1 CAPSULE BY MOUTH EVERY DAY 90 capsule 3   levothyroxine (SYNTHROID, LEVOTHROID) 125 MCG tablet Take 150 mcg by mouth daily before breakfast. TOTAL 150 MG     loratadine (CLARITIN) 10 MG tablet Take 10 mg by mouth daily as needed for allergies.      losartan (COZAAR) 100 MG tablet TAKE 1 TABLET BY MOUTH EVERY DAY 90 tablet 1   metFORMIN (GLUCOPHAGE-XR) 500 MG 24 hr tablet Take 500 mg by mouth daily.      metoprolol succinate (TOPROL XL) 25 MG 24 hr tablet Take 1 tablet (25 mg total) by mouth daily. 90 tablet 3   Multiple Vitamin (MULTIVITAMIN WITH MINERALS) TABS tablet Take 1 tablet by mouth daily. Vita Fusion     Nutritional Supplements (ESTROVEN PO) Take 1 tablet by mouth daily.      Potassium Chloride ER 20 MEQ TBCR TAKE 1 TABLET BY MOUTH EVERY DAY 90 tablet 3   valACYclovir  (VALTREX) 1000 MG tablet Take 1,000 mg by mouth 3 (three) times daily.     Vitamin D, Ergocalciferol, (DRISDOL) 1.25 MG (50000 UNIT) CAPS capsule Take 50,000 Units by mouth once a week.     magnesium oxide (MAG-OX) 400 (240 Mg) MG tablet TAKE 1 TABLET BY MOUTH EVERY DAY (Patient not taking: Reported on 03/22/2022) 30 tablet 1   Current Facility-Administered Medications on File Prior to Visit  Medication Dose Route Frequency Provider Last Rate Last Admin   triamcinolone acetonide (KENALOG) 10 MG/ML injection 10 mg  10 mg Other Once Asencion Islam, DPM       Allergies  Allergen Reactions   Carvedilol Shortness Of Breath    Felt like she had cotton in her throat- felt like it was hard to breath   Erythromycin Shortness Of Breath and Swelling   Lyrica [Pregabalin] Shortness Of Breath and Swelling   Metaxalone Shortness Of Breath   Nitrofurantoin Macrocrystal Swelling and Shortness Of Breath   Ofloxacin Shortness Of Breath and Swelling   Pamelor Shortness Of Breath and Swelling   Prednisone Shortness Of Breath and Swelling   Relafen [Nabumetone] Shortness Of Breath and Swelling   Septra [Bactrim] Shortness Of Breath and Swelling   Sulfasalazine Shortness Of Breath   Escitalopram Oxalate Other (See Comments)    Unsure of exact reaction type   Nitrofurantoin Other (See Comments)   Nortriptyline Other (See Comments)   Sulfa Antibiotics Other (See Comments)   Sulfamethoxazole-Trimethoprim Other (See Comments)   Tricyclic Antidepressants Other (See Comments)   Family History  Problem Relation Age of Onset   Colon polyps Mother    Heart disease Mother        MVP   COPD Father    Hypertension Father    Colon polyps Father    Hypertension Sister    Diabetes Sister    Colon polyps Sister    Heart disease Sister        MVP   Colon polyps Maternal Aunt        x 2   Diabetes Maternal Aunt    COPD Maternal Aunt    Ovarian cancer Paternal Aunt    Breast cancer Maternal Grandmother     Colon polyps Maternal Grandmother    Diabetes Maternal Grandmother    Heart disease Maternal Grandmother  COPD Maternal Grandmother    Colon cancer Neg Hx    Esophageal cancer Neg Hx    Rectal cancer Neg Hx    Stomach cancer Neg Hx     PE: BP 120/70   Pulse 76   Ht 5' 5.5" (1.664 m)   Wt 225 lb 9.6 oz (102.3 kg)   SpO2 97%   BMI 36.97 kg/m  Wt Readings from Last 3 Encounters:  03/22/22 218 lb (98.9 kg)  10/26/21 216 lb 3.2 oz (98.1 kg)  10/08/21 217 lb (98.4 kg)   Constitutional: overweight, in NAD Eyes:  EOMI, no exophthalmos ENT: no neck masses, no cervical lymphadenopathy Cardiovascular: RRR, No MRG Respiratory: CTA B Musculoskeletal: no deformities Skin:no rashes Neurological: no tremor with outstretched hands  ASSESSMENT: 1. Hypothyroidism  2.  Dysphagia  PLAN:  1. Patient with long-standing hypothyroidism, on levothyroxine therapy. - latest thyroid labs reviewed with pt. >> this was slightly elevated, above the upper limit of the target range when checked 2 months ago.  At that time, she admits for missing few doses of levothyroxine.  She noticed starting to have more hair loss then. - she is currently on LT4 125 mcg daily, now taking it every day for the last 2 months - pt. continues to have symptoms on the above dose: fatigue, hair loss, weight gain -she was recommended Mounjaro but she was not able to refill it due to backorder.  I advised her that Ozempic is better available now so she can try this.  Further management per PCP. - we discussed about taking the thyroid hormone every day, with water, >30 minutes before breakfast, separated by >4 hours from acid reflux medications, calcium, iron, multivitamins. Pt. is taking her multivitamins too close to levothyroxine.  She takes them 1 to 2 hours after LT4 and we advised about moving these at least 4 hours away to allow consistent absorption of levothyroxine. - will check thyroid tests today: TSH, fT3, and fT4 -  If labs are abnormal, she will need to return for repeat TFTs in 1.5 months - Otherwise, I will see her back in 6 months  2.  Dysphagia -prominent few months ago, now abated -Reviewed previous investigation: Patient has pharyngeal phase dysphagia (mild) for swallow evaluation studies from 2017.   -She also has a mild hiatal hernia, which is unlikely to contribute -She also had an EGD which did not show major pathology -At today's visit, we reviewed the results of her thyroid ultrasound obtained in 2023: this showed a diminutive thyroid, without any nodules, which is also unlikely to contribute to her dysphagia -On physical exam, she does not have any palpable neck masses -Will continue to monitor her expectantly  Component     Latest Ref Rng 08/27/2022  TSH     0.35 - 5.50 uIU/mL 0.36   T4,Free(Direct)     0.60 - 1.60 ng/dL 7.82   Triiodothyronine,Free,Serum     2.3 - 4.2 pg/mL 3.5   Thyroid tests are normal.  Plan to repeat them in approximately 3 months, but for now we can continue the same dose of LT4.  Carlus Pavlov, MD PhD [facility] Endocrinology

## 2022-08-30 ENCOUNTER — Ambulatory Visit: Admitting: "Endocrinology

## 2022-11-15 ENCOUNTER — Other Ambulatory Visit (INDEPENDENT_AMBULATORY_CARE_PROVIDER_SITE_OTHER): Payer: Medicare PPO

## 2022-11-15 DIAGNOSIS — E038 Other specified hypothyroidism: Secondary | ICD-10-CM | POA: Diagnosis not present

## 2022-11-15 DIAGNOSIS — E063 Autoimmune thyroiditis: Secondary | ICD-10-CM | POA: Diagnosis not present

## 2022-11-15 LAB — TSH: TSH: 0.12 u[IU]/mL — ABNORMAL LOW (ref 0.35–5.50)

## 2022-11-15 LAB — T4, FREE: Free T4: 1.38 ng/dL (ref 0.60–1.60)

## 2022-11-16 ENCOUNTER — Other Ambulatory Visit: Payer: Self-pay

## 2022-11-16 MED ORDER — LEVOTHYROXINE SODIUM 112 MCG PO TABS: 112.0000 ug | ORAL_TABLET | Freq: Every day | ORAL | 3 refills | Status: DC

## 2022-12-30 ENCOUNTER — Other Ambulatory Visit (INDEPENDENT_AMBULATORY_CARE_PROVIDER_SITE_OTHER): Payer: Medicare PPO

## 2022-12-30 DIAGNOSIS — E038 Other specified hypothyroidism: Secondary | ICD-10-CM | POA: Diagnosis not present

## 2022-12-30 LAB — T4, FREE: Free T4: 1.27 ng/dL (ref 0.60–1.60)

## 2022-12-30 LAB — TSH: TSH: 0.04 u[IU]/mL — ABNORMAL LOW (ref 0.35–5.50)

## 2022-12-31 ENCOUNTER — Telehealth: Payer: Self-pay | Admitting: Internal Medicine

## 2022-12-31 ENCOUNTER — Encounter: Payer: Self-pay | Admitting: Internal Medicine

## 2022-12-31 DIAGNOSIS — E119 Type 2 diabetes mellitus without complications: Secondary | ICD-10-CM | POA: Diagnosis not present

## 2022-12-31 DIAGNOSIS — H43811 Vitreous degeneration, right eye: Secondary | ICD-10-CM | POA: Diagnosis not present

## 2022-12-31 DIAGNOSIS — H353132 Nonexudative age-related macular degeneration, bilateral, intermediate dry stage: Secondary | ICD-10-CM | POA: Diagnosis not present

## 2022-12-31 DIAGNOSIS — E038 Other specified hypothyroidism: Secondary | ICD-10-CM

## 2022-12-31 DIAGNOSIS — H43822 Vitreomacular adhesion, left eye: Secondary | ICD-10-CM | POA: Diagnosis not present

## 2022-12-31 DIAGNOSIS — H35033 Hypertensive retinopathy, bilateral: Secondary | ICD-10-CM | POA: Diagnosis not present

## 2022-12-31 MED ORDER — LEVOTHYROXINE SODIUM 100 MCG PO TABS
100.0000 ug | ORAL_TABLET | Freq: Every day | ORAL | 5 refills | Status: DC
Start: 2022-12-31 — End: 2023-03-01

## 2022-12-31 NOTE — Telephone Encounter (Signed)
Labs were ordered under the wrong provider by Mistake, please see lab tab.

## 2022-12-31 NOTE — Telephone Encounter (Signed)
Pt also sent a Pt advice request and Dr. Elvera Lennox has been made aware. The orders were put under [facility] by mistake.

## 2022-12-31 NOTE — Telephone Encounter (Signed)
Patient called and advised that her Lab results were sent to wrong provider. She is a patient of Dr. Elvera Lennox, and they were sent to Dr. Roosevelt Locks. Patient want this resolved so Dr. Elvera Lennox get her results. Correct Provider was attached to chart although the Lab sent under the wrong Provider. Please contact patient when completed.

## 2023-01-11 DIAGNOSIS — K219 Gastro-esophageal reflux disease without esophagitis: Secondary | ICD-10-CM | POA: Diagnosis not present

## 2023-01-11 DIAGNOSIS — E039 Hypothyroidism, unspecified: Secondary | ICD-10-CM | POA: Diagnosis not present

## 2023-01-11 DIAGNOSIS — E119 Type 2 diabetes mellitus without complications: Secondary | ICD-10-CM | POA: Diagnosis not present

## 2023-01-11 DIAGNOSIS — I10 Essential (primary) hypertension: Secondary | ICD-10-CM | POA: Diagnosis not present

## 2023-01-11 DIAGNOSIS — J0100 Acute maxillary sinusitis, unspecified: Secondary | ICD-10-CM | POA: Diagnosis not present

## 2023-01-11 DIAGNOSIS — M797 Fibromyalgia: Secondary | ICD-10-CM | POA: Diagnosis not present

## 2023-01-11 DIAGNOSIS — E7800 Pure hypercholesterolemia, unspecified: Secondary | ICD-10-CM | POA: Diagnosis not present

## 2023-01-11 DIAGNOSIS — J4520 Mild intermittent asthma, uncomplicated: Secondary | ICD-10-CM | POA: Diagnosis not present

## 2023-01-19 DIAGNOSIS — H43813 Vitreous degeneration, bilateral: Secondary | ICD-10-CM | POA: Diagnosis not present

## 2023-01-19 DIAGNOSIS — H353132 Nonexudative age-related macular degeneration, bilateral, intermediate dry stage: Secondary | ICD-10-CM | POA: Diagnosis not present

## 2023-01-19 DIAGNOSIS — H35033 Hypertensive retinopathy, bilateral: Secondary | ICD-10-CM | POA: Diagnosis not present

## 2023-01-19 DIAGNOSIS — E119 Type 2 diabetes mellitus without complications: Secondary | ICD-10-CM | POA: Diagnosis not present

## 2023-03-01 ENCOUNTER — Ambulatory Visit (INDEPENDENT_AMBULATORY_CARE_PROVIDER_SITE_OTHER): Payer: Medicare PPO | Admitting: Internal Medicine

## 2023-03-01 ENCOUNTER — Encounter: Payer: Self-pay | Admitting: Internal Medicine

## 2023-03-01 VITALS — BP 120/70 | HR 66 | Ht 65.5 in | Wt 208.6 lb

## 2023-03-01 DIAGNOSIS — E063 Autoimmune thyroiditis: Secondary | ICD-10-CM

## 2023-03-01 LAB — T4, FREE: Free T4: 0.85 ng/dL (ref 0.60–1.60)

## 2023-03-01 LAB — TSH: TSH: 1.08 u[IU]/mL (ref 0.35–5.50)

## 2023-03-01 MED ORDER — LEVOTHYROXINE SODIUM 100 MCG PO TABS: 100.0000 ug | ORAL_TABLET | Freq: Every day | ORAL | 3 refills | Status: DC

## 2023-03-01 NOTE — Patient Instructions (Signed)
Please continue levothyroxine 100 mcg daily.  Take the thyroid hormone every day, with water, at least 30 minutes before breakfast, separated by at least 4 hours from: - acid reflux medications - calcium - iron - multivitamins  Please stop at the lab.  Please return for a follow-up appointment in 6 months.

## 2023-03-01 NOTE — Progress Notes (Signed)
Patient ID: Glenda Evans, female   DOB: 04-Aug-1955, 67 y.o.   MRN: [id number]  HPI  Glenda Evans is a 67 y.o.-year-old female, initially referred by her PCP, Dr. Tiburcio Pea, returning for follow-up for uncontrolled Hashimoto's hypothyroidism.  She is a cousin of Glenda Evans, who is also my patient.  Last visit 6 months ago.  Interim history: She has been on Mounjaro, but could not tolerate 7.5 mg weekly 2/2 N/V >> off >> retried 5 mg weekly >> still N/V >> off for 3 weeks. She gained 3 pounds back after stopping Mounjaro. She feels well otherwise, without complaints.  Reviewed history: Pt. has been dx with hypothyroidism at 67 y/o (1978): hair loss, dysphagia.  She has been on brand-name and generic levothyroxine since then.  Pt. is on levothyroxine 100 mcg daily, taken (last dose decrease 12/2022): - in am - fasting - at least 1h from b'fast - no calcium - no iron - + multivitamins: 1-2h later >> off now - + PPIs - Nexium - in the evening - not on Biotin  I reviewed pt's thyroid tests: Lab Results  Component Value Date   TSH 0.04 (L) 12/30/2022   TSH 0.12 (L) 11/15/2022   TSH 0.36 08/27/2022   TSH 2.390 10/08/2021   TSH 1.420 05/24/2019   TSH 1.579  11/09/2006  06/22/2022: TSH >4 (reportedly, above target, admits to skipping few doses of LT4)  Antithyroid antibodies: No results found for: "THGAB" No components found for: "TPOAB" However, she was told at diagnosis that she had Hashimoto's thyroiditis.  Pt describes: - fatigue >> stable - hair loss >> resolved  No: - cold intolerance - constipation - dry skin  Pt denies: - feeling nodules in neck - hoarseness - dysphagia - SOB with lying down  Reviewed previous investigation: Barium swallow (10/22/2015): Fluoroscopic evaluation of swallowing demonstrates disruption of 2 out of 3 primary esophageal peristaltic waves. No fixed stricture, fold thickening or mass. Small hiatal hernia. The patient swallowed a 13  mm barium tablet which freely passed into the stomach. No reflux with the water siphon maneuver.   IMPRESSION: Nonspecific esophageal motility disorder. Small hiatal hernia.  Swallow evaluation (10/22/2015): CHL IP CLINICAL IMPRESSIONS 10/22/2015  Therapy Diagnosis Mild pharyngeal phase dysphagia  Clinical Impression Pt presents with mild pharyngeal dysphagia, characterized by delayed swallow reflex due to decreased sensation for primary trigger site. Trigger was noted to occur at the level of the pyriform on thin liquids, but was timely for other consistencies. No penetration, aspiration, or post-swalow residue was noted on this study.  Regular Barium Swallow was completed following the MBS, and a hiatal hernia was identified. For this reason, pt was given information regarding behavioral and dietary strategies for management of esophageal dysmotility. No further ST intervenrion is recommended at htis time. Please reconsult if needs arise.  Impact on safety and function Mild aspiration risk   EGD (10/28/2015): Surgical [P], gastric body and gastric polyp - BENIGN GASTRIC MUCOSA AND BENIGN FUNDIC GLAND POLYP. - WARTHIN-STARRY STAIN NEGATIVE FOR HELICOBACTER PYLORI. - NO INTESTINAL METAPLASIA, DYSPLASIA OR MALIGNANCY.  Thyroid ultrasound (11/13/2021): Parenchymal Echotexture: Mildly heterogenous  Isthmus: 1 mm  Right lobe: 3.3 x 0.7 x 0.9 cm  Left lobe: 3.2 x 0.5 x 0.9 cm  _________________________________________________________   Estimated total number of nodules >/= 1 cm: 0 _________________________________________________________   Atrophic mildly heterogeneous thyroid. Negative for nodule. No hypervascularity or regional adenopathy.   IMPRESSION: Atrophic heterogeneous thyroid gland compatible with sequelae from chronic medical thyroid  disease. Negative for nodule.  She has + FH of thyroid disorders in: mother - on LT4 25 mcg, MGM, M aunt. No FH of thyroid cancer. No h/o radiation  tx to head or neck. No recent use of iodine supplements.  No herbal supplements.  No recent steroid use - she is allergic to Prednisone.  Pt. also has a history of diabetes (well-controlled/managed by PCP), hyperlipidemia, hypertension, vitamin D deficiency, obesity, GERD, IBS, fibromyalgia, depression/anxiety, insomnia, migraine.  She also has mitral valve prolapse - sees Dr. Rennis Golden. She had shingles neuropathy >> started Valtrex.  ROS: + see HPI  Reviewed: Past Medical History:  Diagnosis Date   Allergy    Anxiety    Arthritis    Asthma    Cataract    right eye    Depression    Fatigue    Fibromyalgia    GERD (gastroesophageal reflux disease)    Heart murmur    HLD (hyperlipidemia)    Hypertension    Hypothyroidism    IBS (irritable bowel syndrome)    Insomnia    Migraine    Mitral valve prolapse    Osteopenia    Pre-diabetes    PVC (premature ventricular contraction)    Stein-Leventhal syndrome    Wears glasses    Past Surgical History:  Procedure Laterality Date   ABDOMINAL HYSTERECTOMY     BUBBLE STUDY  01/04/2019   Procedure: BUBBLE STUDY;  Surgeon: Chrystie Nose, MD;  Location: [facility] ENDOSCOPY;  Service: Cardiovascular;;   CATARACT EXTRACTION Bilateral 2023   CESAREAN SECTION     x 2   CHOLECYSTECTOMY     COLONOSCOPY     LIPOMA EXCISION  11/26/2010   thigh x3 cyst   OVARY SURGERY     to poke holes in ovaries for PCOD   PATENT FORAMEN OVALE(PFO) CLOSURE N/A 02/15/2019   Procedure: PATENT FORAMEN OVALE (PFO) CLOSURE;  Surgeon: Tonny Bollman, MD;  Location: [facility] INVASIVE CV LAB;  Service: Cardiovascular;  Laterality: N/A;   TEE WITHOUT CARDIOVERSION N/A 01/04/2019   Procedure: TRANSESOPHAGEAL ECHOCARDIOGRAM (TEE);  Surgeon: Chrystie Nose, MD;  Location: [facility] ENDOSCOPY;  Service: Cardiovascular;  Laterality: N/A;   TONSILLECTOMY     Social History   Socioeconomic History   Marital status: Divorced    Spouse name: Not on file   Number of children: 2    Years of education: Not on file   Highest education level: Not on file  Occupational History   Occupation: disabled   Occupation: retired  Tobacco Use   Smoking status: Never   Smokeless tobacco: Never  Substance and Sexual Activity   Alcohol use: No    Alcohol/week: 0.0 standard drinks of alcohol   Drug use: No   Sexual activity: Not on file  Other Topics Concern   Not on file  Social History Narrative   Not on file   Social Determinants of Health   Financial Resource Strain: Not on file  Food Insecurity: Not on file  Transportation Needs: Not on file  Physical Activity: Not on file  Stress: Not on file  Social Connections: Not on file  Intimate Partner Violence: Not on file   Current Outpatient Medications on File Prior to Visit  Medication Sig Dispense Refill   Accu-Chek FastClix Lancets MISC FOR USE WHEN CHECKING BLOODSUGARS AS DIRECTED ONCE DAILY DX E11.9 102 DAYS     ACCU-CHEK GUIDE test strip USE AS DIRECTED ONCE A DAY     albuterol (PROVENTIL HFA;VENTOLIN HFA)  108 (90 BASE) MCG/ACT inhaler Inhale 2 puffs into the lungs every 6 (six) hours as needed for wheezing or shortness of breath.      aspirin EC 81 MG tablet Take 81 mg by mouth daily.     atorvastatin (LIPITOR) 40 MG tablet TAKE 1 TABLET (40 MG TOTAL) BY MOUTH DAILY AT 6 PM. 90 tablet 3   Black Cohosh 40 MG CAPS Take 40 mg by mouth daily.      diltiazem (CARDIZEM CD) 360 MG 24 hr capsule TAKE 1 CAPSULE BY MOUTH EVERY DAY 90 capsule 3   levothyroxine (SYNTHROID) 100 MCG tablet Take 1 tablet (100 mcg total) by mouth daily. 30 tablet 5   levothyroxine (SYNTHROID) 112 MCG tablet Take 1 tablet (112 mcg total) by mouth daily before breakfast. TOTAL 150 MG 45 tablet 3   loratadine (CLARITIN) 10 MG tablet Take 10 mg by mouth daily as needed for allergies.      losartan (COZAAR) 100 MG tablet TAKE 1 TABLET BY MOUTH EVERY DAY 90 tablet 1   magnesium oxide (MAG-OX) 400 (240 Mg) MG tablet TAKE 1 TABLET BY MOUTH EVERY DAY  (Patient not taking: Reported on 03/22/2022) 30 tablet 1   metFORMIN (GLUCOPHAGE-XR) 500 MG 24 hr tablet Take 500 mg by mouth daily.      metoprolol succinate (TOPROL XL) 25 MG 24 hr tablet Take 1 tablet (25 mg total) by mouth daily. 90 tablet 3   Multiple Vitamin (MULTIVITAMIN WITH MINERALS) TABS tablet Take 1 tablet by mouth daily. V[employer] Fusion     Nutritional Supplements (ESTROVEN PO) Take 1 tablet by mouth daily.      Potassium Chloride ER 20 MEQ TBCR TAKE 1 TABLET BY MOUTH EVERY DAY 90 tablet 3   valACYclovir (VALTREX) 1000 MG tablet Take 1,000 mg by mouth 3 (three) times daily.     Vitamin D, Ergocalciferol, (DRISDOL) 1.25 MG (50000 UNIT) CAPS capsule Take 50,000 Units by mouth once a week.     Current Facility-Administered Medications on File Prior to Visit  Medication Dose Route Frequency Provider Last Rate Last Admin   triamcinolone acetonide (KENALOG) 10 MG/ML injection 10 mg  10 mg Other Once Asencion Islam, DPM       Allergies  Allergen Reactions   Carvedilol Shortness Of Breath    Felt like she had cotton in her throat- felt like it was hard to breath   Erythromycin Shortness Of Breath and Swelling   Lyrica [Pregabalin] Shortness Of Breath and Swelling   Metaxalone Shortness Of Breath   Nitrofurantoin Macrocrystal Swelling and Shortness Of Breath   Ofloxacin Shortness Of Breath and Swelling   Pamelor Shortness Of Breath and Swelling   Prednisone Shortness Of Breath and Swelling   Relafen [Nabumetone] Shortness Of Breath and Swelling   Septra [Bactrim] Shortness Of Breath and Swelling   Sulfasalazine Shortness Of Breath   Escitalopram Oxalate Other (See Comments)    Unsure of exact reaction type   Nitrofurantoin Other (See Comments)   Nortriptyline Other (See Comments)   Sulfa Antibiotics Other (See Comments)   Sulfamethoxazole-Trimethoprim Other (See Comments)   Tricyclic Antidepressants Other (See Comments)   Family History  Problem Relation Age of Onset   Colon  polyps Mother    Heart disease Mother        MVP   COPD Father    Hypertension Father    Colon polyps Father    Hypertension Sister    Diabetes Sister    Colon polyps Sister  Heart disease Sister        MVP   Colon polyps Maternal Aunt        x 2   Diabetes Maternal Aunt    COPD Maternal Aunt    Ovarian cancer Paternal Aunt    Breast cancer Maternal Grandmother    Colon polyps Maternal Grandmother    Diabetes Maternal Grandmother    Heart disease Maternal Grandmother    COPD Maternal Grandmother    Colon cancer Neg Hx    Esophageal cancer Neg Hx    Rectal cancer Neg Hx    Stomach cancer Neg Hx     PE: BP 120/70   Pulse 66   Ht 5' 5.5" (1.664 m)   Wt 208 lb 9.6 oz (94.6 kg)   SpO2 97%   BMI 34.18 kg/m  Wt Readings from Last 3 Encounters:  03/01/23 208 lb 9.6 oz (94.6 kg)  08/27/22 225 lb 9.6 oz (102.3 kg)  03/22/22 218 lb (98.9 kg)   Constitutional: overweight, in NAD Eyes:  EOMI, no exophthalmos ENT: no neck masses, no cervical lymphadenopathy Cardiovascular: RRR, No MRG Respiratory: CTA B Musculoskeletal: no deformities Skin:no rashes Neurological: no tremor with outstretched hands  ASSESSMENT: 1. Hypothyroidism  2.  Dysphagia  PLAN:  1. Patient with long standing hypothyroidism, on levothyroxine therapy - prev.  Occasionally missing levothyroxine doses - latest thyroid labs reviewed with pt. >> TSH was suppressed: Lab Results  Component Value Date   TSH 0.04 (L) 12/30/2022  - she continues on LT4 100 mcg daily, dose decreased after the above results returned, but previously gradually decreased from 125 mcg daily at last visit - pt feels good on this dose.  Before last visit she had fatigue, hair loss, weight gain.  She was recommended to start [facility], which she did, but she could not continue due to GI symptoms.  At today's visit, however, she still lost 17 pounds since last visit and she is feeling well. - we discussed about taking the thyroid  hormone every day, with water, >30 minutes before breakfast, separated by >4 hours from acid reflux medications, calcium, iron, multivitamins. Pt. is taking it correctly now.  At last visit, she was taking multivitamins 1 to 2 hours after levothyroxine and I advised her to move these at least 4 hours later.  She stopped multivitamin since last visit. - will check thyroid tests today: TSH and fT4 - If labs are abnormal, she will need to return for repeat TFTs in 1.5 months -OTW, I will see her back in 6 months  2.  Dysphagia -She complained about this at last visit, but it was improving at that time, now resolved -Reviewing previous investigation, she had mild pharyngeal phase dysphagia on swallowing evaluation studies from 2017.  She also had a mild hiatal hernia which could have contributed.  She had an EGD which did not show major pathology -She also had a thyroid ultrasound in 2023 which showed a diminutive thyroid, without any nodules, which was unlikely to contribute to her dysphagia -No masses felt on palpation of her neck today -Will continue to manage her expectantly  Needs refills - 90 days - [place].  Component     Latest Ref Rng 03/01/2023  TSH     0.35 - 5.50 uIU/mL 1.08   T4,Free(Direct)     0.60 - 1.60 ng/dL 1.47   Excellent TFTs.  Carlus Pavlov, MD PhD [facility] Endocrinology

## 2023-03-14 DIAGNOSIS — L905 Scar conditions and fibrosis of skin: Secondary | ICD-10-CM | POA: Diagnosis not present

## 2023-03-14 DIAGNOSIS — D1801 Hemangioma of skin and subcutaneous tissue: Secondary | ICD-10-CM | POA: Diagnosis not present

## 2023-03-14 DIAGNOSIS — L821 Other seborrheic keratosis: Secondary | ICD-10-CM | POA: Diagnosis not present

## 2023-03-14 DIAGNOSIS — D485 Neoplasm of uncertain behavior of skin: Secondary | ICD-10-CM | POA: Diagnosis not present

## 2023-03-14 DIAGNOSIS — L814 Other melanin hyperpigmentation: Secondary | ICD-10-CM | POA: Diagnosis not present

## 2023-03-14 DIAGNOSIS — D225 Melanocytic nevi of trunk: Secondary | ICD-10-CM | POA: Diagnosis not present

## 2023-03-23 DIAGNOSIS — E119 Type 2 diabetes mellitus without complications: Secondary | ICD-10-CM | POA: Diagnosis not present

## 2023-03-23 DIAGNOSIS — H43813 Vitreous degeneration, bilateral: Secondary | ICD-10-CM | POA: Diagnosis not present

## 2023-03-23 DIAGNOSIS — H353132 Nonexudative age-related macular degeneration, bilateral, intermediate dry stage: Secondary | ICD-10-CM | POA: Diagnosis not present

## 2023-03-23 DIAGNOSIS — H35033 Hypertensive retinopathy, bilateral: Secondary | ICD-10-CM | POA: Diagnosis not present

## 2023-04-28 ENCOUNTER — Encounter: Payer: Self-pay | Admitting: Internal Medicine

## 2023-04-28 ENCOUNTER — Ambulatory Visit: Payer: Medicare PPO | Attending: Internal Medicine | Admitting: Internal Medicine

## 2023-04-28 VITALS — BP 118/76 | HR 70 | Ht 65.0 in | Wt 209.6 lb

## 2023-04-28 DIAGNOSIS — I10 Essential (primary) hypertension: Secondary | ICD-10-CM

## 2023-04-28 DIAGNOSIS — Q2112 Patent foramen ovale: Secondary | ICD-10-CM | POA: Diagnosis not present

## 2023-04-28 DIAGNOSIS — E782 Mixed hyperlipidemia: Secondary | ICD-10-CM | POA: Diagnosis not present

## 2023-04-28 NOTE — Progress Notes (Signed)
 OFFICE NOTE  Chief Complaint:  Follow-up  Primary Care Physician: Arloa Elsie SAUNDERS, MD  HPI:  Glenda Evans is a 68 y.o. female with a past medical history significant for some anxiety, fibromyalgia, hypertension, palpitations and hypothyroidism. She initially had evaluation of her palpitations in the early 2000's by Dr. Esmeralda Sharps. At the time she wore monitor, had a stress test and echocardiogram. The echocardiogram suggested mitral valve prolapse. Her monitor failed to show clear etiology of her symptoms. She does have a history of PVCs on her chart however those were not noted by EKG today. Recently she said she's had some increasing burden of palpitations. She denies any worsening stress. She also has sharp chest discomfort. She feels like this is different than her fibromyalgia, however it was reproduced during auscultation with my stethoscope today. She's had close to 20 pound weight gain over the past several months due to some orthopedic problems in her feet which she says is decreased her exercise. This certainly could explain some of her shortness of breath. She feels like the episodes of palpitations that occur very infrequently are paroxysmal. Oftentimes include heart racing and then can be followed by feelings of significant fatigue which is worse than her daily chronic fatigue.  09/17/2015  Mrs. Glenda Evans returns today for follow-up. She wore a monitor which demonstrated PACs and has had some PVCs. Her echocardiogram showed normal systolic function with mild diastolic dysfunction. There was no evidence of mitral valve prolapse that was diagnostically significant. We talked about possible medication changes to try to help with her palpitations. She does note that she has significant side effects from verapamil  including possible constipation.   12/26/2015  Mrs. Glenda Evans returns today for follow-up. She reports marked improvement in her palpitations with adjustment in her medications.  Blood pressure is excellent today at 120/80. She denies any chest pain or worsening shortness of breath. She continues to be fatigued. Sleep is poor at night and generally she is quite awake. Occasionally she gas for breath but does not report heavy snoring. She naps a lot during the day.  05/14/2016  Mrs. Glenda Evans reports improvement in her palpitations on diltiazem  and she is pleased with good blood pressure control. She continues to struggle with daytime somnolence and insomnia at night. She manages only to get a couple hours of sleep at night but generally can sleep 6-8 hours during the day. He feels chronically fatigued. She underwent a sleep study which was negative for obstructive sleep apnea however might of been concern for either restless leg syndrome which she vehemently denies or perhaps narcolepsy disorder. Dr. Burnard ordered and an MSLT test. This is pending. She does use clonazepam  to try to help with sleep and anxiety at night but she says is rarely effective. Her main issues not being able to turn her brain off at night.  12/16/2016  Mrs. Glenda Evans was seen today in follow-up. Recently she's been concerned about leg pain and swelling. She did have a sleep study which was negative for sleep apnea. She reports her palpitations have improved on diltiazem . She does carry a diagnosis of fibromyalgia may have chronic fatigue syndrome. In addition she has some degree of neuropathy but has been intolerant to neuropathic pain medicines. She reports shooting pain down both legs. Seems to be worse in the right. There is some associated swelling. It's worse when she walks and gets better when she rests. She reports some cold feet. She's concerned as her husband recently had popliteal aneurysms  and ultimately had to have amputation of one of his legs. She's also concerned about same prominent veins as it may be related to varicose veins. She wonders if she may have had a blood clot.  06/22/2018  Mrs. Glenda Evans  is seen today in routine follow-up.  Unfortunately recently she had a stroke when she was visiting a family member [facility].  She was under significant stress and ended up being hospitalized she has had for 2 weeks.  She underwent various imaging studies and was felt that her symptoms were due to stress.  She does have a history of elevated cholesterol and most recently her lipid profile in February 2019 did show a direct LDL 69 and triglycerides of 450.  She had previously been on WelChol but was not on statin therapy.  She did see Dr. Rosemarie with [facility] neurology who recommended her starting on atorvastatin  20 mg daily.  Currently however she is not on that medication has not been on it for several months because she was not sure why she was taking it.  Her last lipid profile was in March 2019 again showed a total cholesterol 184, HDL 41, LDL 91 and triglycerides of 260.  Blood pressure today was 132/80.  She has managed to lose about 8 pounds and is working on dietary modification.  Recently, she was also diagnosed with type 2 diabetes and her hemoglobin A1c was over 6.8.  She started on metformin which has helped her sugars and also surprisingly helped symptoms of leg pain which she had.  As part of her work-up she did have MRI/MRA.  This did demonstrate a bovine configuration of her aortic arch but no evidence of carotid artery disease, stenosis, aneurysm or vascular occlusion.  Surprisingly, I did locate an echocardiogram performed on 225/2019, which shows an LVEF of 50 to 55%.  If one looks really closely a bubble study was performed and under the right atrial tab it does indicate there was a right to left interatrial shunt after injection of agitated saline contrast.  This suggest that a PFO is noted.  I was able to locate the neurology consult note which indicated positive PFO but negative Dopplers for DVT.  Transcranial Doppler indicated normal intracranial velocities.  There was no suggestion of  pursuing the PFO as a cause of TIA, since LE venous dopplers were performed and were negative for DVT.  12/06/2018  Mrs. Altland is seen today in follow-up.  She was supposed to undergo TEE to evaluate for PFO however with COVID this was delayed.  She indicates that she is interested in pursuing this.  Unfortunately she has not been compliant with aspirin .  I asked her to restart that today.  Otherwise she is asymptomatic.  She denies any chest pain or worsening shortness of breath.  She has had no further TIAs but does get some occasional dizzy episodes which she thinks could be related.  EKG is normal today.  Blood pressure is normal as well.  07/25/2019  Mrs. Kotlarz is seen today for follow-up.  She underwent successful closure of her PFO and there was no residual shunting.  Unfortunately, she subsequently developed some inappropriate tachycardia and flushing.  She was seen by our pharmacist Eleanor Crews, who uptitrated her calcium  channel blocker and sent a number of labs including work-up for pheochromocytoma.  Not surprisingly this was negative.  Over a short period of time her heart rate has come down.  She is also been struggling with possible shingles or  some type of neuropathic pain in her upper chest that started under her right armpit and extends under the right breast.  She has been on Valtrex and gabapentin without much relief.   06/16/2020  Mrs. Schmit returns today for follow-up.  Unfortunately she has had a very difficult past several months.  In the fall she contracted COVID-19.  She ended up getting antibody therapies for it.  She was unvaccinated.  Her husband, who is a veteran was also apparently unvaccinated and had previously approached his PCP who did not encourage the vaccine.  Subsequently he contracted COVID-19 and unfortunately died 10 days later of the virus.  She struggled as well and had been hospitalized 3 different times with symptoms related to that.  More recently she  has been placed on a Breo inhaler and finally has improvement in her symptoms.  She says she is just about back to baseline at this point.  03/22/2022  Ms. Orman is seen today in follow-up.  She reports she is feeling very well.  She had previously seen one of our APP's who ordered CT coronary angiography on her.  This was negative for calcium  or any coronary disease.  It also demonstrated the Amplatzer PFO closure device in place.  No residual shunting was noted.  04/28/2023  Ms. Redner returns today for follow-up.  She has recently been seen by our nurse practitioners.  Last year she had episodes of shortness of breath and diaphoresis and tachycardia.  She underwent extensive workup including CT coronary angiography which showed no coronary artery disease or coronary calcifications.  Her PFO closure was stable.  No evidence of interatrial shunting.  Echo at that time was reassuring with LVEF 55 to 60%.  BNP was negative.  Monitoring was performed but never finalized.  She now reports that she is feeling a whole lot better.  She stopped her metoprolol  because she had some low blood pressure but remains on diltiazem .  She denies any significant tachycardia.  She denies any worsening shortness of breath.  Cholesterol was assessed early last year which showed good control of her LDL at 63 but minimally elevated triglycerides of 212.  PMHx:  Past Medical History:  Diagnosis Date   Allergy    Anxiety    Arthritis    Asthma    Cataract    right eye    Depression    Fatigue    Fibromyalgia    GERD (gastroesophageal reflux disease)    Heart murmur    HLD (hyperlipidemia)    Hypertension    Hypothyroidism    IBS (irritable bowel syndrome)    Insomnia    Migraine    Mitral valve prolapse    Osteopenia    Pre-diabetes    PVC (premature ventricular contraction)    Stein-Leventhal syndrome    Wears glasses     Past Surgical History:  Procedure Laterality Date   ABDOMINAL HYSTERECTOMY      BUBBLE STUDY  01/04/2019   Procedure: BUBBLE STUDY;  Surgeon: Mona Vinie BROCKS, MD;  Location: [facility] ENDOSCOPY;  Service: Cardiovascular;;   CATARACT EXTRACTION Bilateral 2023   CESAREAN SECTION     x 2   CHOLECYSTECTOMY     COLONOSCOPY     LIPOMA EXCISION  11/26/2010   thigh x3 cyst   OVARY SURGERY     to poke holes in ovaries for PCOD   PATENT FORAMEN OVALE(PFO) CLOSURE N/A 02/15/2019   Procedure: PATENT FORAMEN OVALE (PFO) CLOSURE;  Surgeon: Wonda Sharper,  MD;  Location: [facility] INVASIVE CV LAB;  Service: Cardiovascular;  Laterality: N/A;   TEE WITHOUT CARDIOVERSION N/A 01/04/2019   Procedure: TRANSESOPHAGEAL ECHOCARDIOGRAM (TEE);  Surgeon: Mona Vinie BROCKS, MD;  Location[facility];  Service: Cardiovascular;  Laterality: N/A;   TONSILLECTOMY      FAMHx:  Family History  Problem Relation Age of Onset   Colon polyps Mother    Heart disease Mother        MVP   COPD Father    Hypertension Father    Colon polyps Father    Hypertension Sister    Diabetes Sister    Colon polyps Sister    Heart disease Sister        MVP   Colon polyps Maternal Aunt        x 2   Diabetes Maternal Aunt    COPD Maternal Aunt    Ovarian cancer Paternal Aunt    Breast cancer Maternal Grandmother    Colon polyps Maternal Grandmother    Diabetes Maternal Grandmother    Heart disease Maternal Grandmother    COPD Maternal Grandmother    Colon cancer Neg Hx    Esophageal cancer Neg Hx    Rectal cancer Neg Hx    Stomach cancer Neg Hx     SOCHx:   reports that she has never smoked. She has never used smokeless tobacco. She reports that she does not drink alcohol and does not use drugs.  ALLERGIES:  Allergies  Allergen Reactions   Carvedilol  Shortness Of Breath    Felt like she had cotton in her throat- felt like it was hard to breath   Erythromycin Shortness Of Breath and Swelling   Lyrica [Pregabalin] Shortness Of Breath and Swelling   Metaxalone Shortness Of Breath   Nitrofurantoin  Macrocrystal Swelling and Shortness Of Breath   Ofloxacin Shortness Of Breath and Swelling   Pamelor Shortness Of Breath and Swelling   Prednisone Shortness Of Breath and Swelling   Relafen [Nabumetone] Shortness Of Breath and Swelling   Septra [Bactrim] Shortness Of Breath and Swelling   Sulfasalazine Shortness Of Breath   Escitalopram Oxalate Other (See Comments)    Unsure of exact reaction type   Nitrofurantoin Other (See Comments)   Nortriptyline Other (See Comments)   Sulfa Antibiotics Other (See Comments)   Sulfamethoxazole-Trimethoprim Other (See Comments)   Tricyclic Antidepressants Other (See Comments)    ROS: Pertinent items noted in HPI and remainder of comprehensive ROS otherwise negative.  HOME MEDS: Current Outpatient Medications  Medication Sig Dispense Refill   Accu-Chek FastClix Lancets MISC FOR USE WHEN CHECKING BLOODSUGARS AS DIRECTED ONCE DAILY DX E11.9 102 DAYS     ACCU-CHEK GUIDE test strip USE AS DIRECTED ONCE A DAY     albuterol  (PROVENTIL  HFA;VENTOLIN  HFA) 108 (90 BASE) MCG/ACT inhaler Inhale 2 puffs into the lungs every 6 (six) hours as needed for wheezing or shortness of breath.      aspirin  EC 81 MG tablet Take 81 mg by mouth daily.     atorvastatin  (LIPITOR) 40 MG tablet TAKE 1 TABLET (40 MG TOTAL) BY MOUTH DAILY AT 6 PM. 90 tablet 3   Black Cohosh 40 MG CAPS Take 40 mg by mouth daily.      diltiazem  (CARDIZEM  CD) 360 MG 24 hr capsule TAKE 1 CAPSULE BY MOUTH EVERY DAY 90 capsule 3   levothyroxine  (SYNTHROID ) 100 MCG tablet Take 1 tablet (100 mcg total) by mouth daily. 90 tablet 3   losartan  (COZAAR ) 100  MG tablet TAKE 1 TABLET BY MOUTH EVERY DAY 90 tablet 1   magnesium  oxide (MAG-OX) 400 (240 Mg) MG tablet TAKE 1 TABLET BY MOUTH EVERY DAY 30 tablet 1   metFORMIN (GLUCOPHAGE-XR) 500 MG 24 hr tablet Take 500 mg by mouth daily.      Multiple Vitamin (MULTIVITAMIN WITH MINERALS) TABS tablet Take 1 tablet by mouth daily. Vita Fusion     Nutritional  Supplements (ESTROVEN PO) Take 1 tablet by mouth daily.      Potassium Chloride  ER 20 MEQ TBCR TAKE 1 TABLET BY MOUTH EVERY DAY 90 tablet 3   Vitamin D , Ergocalciferol , (DRISDOL ) 1.25 MG (50000 UNIT) CAPS capsule Take 50,000 Units by mouth once a week.     loratadine (CLARITIN) 10 MG tablet Take 10 mg by mouth daily as needed for allergies.  (Patient not taking: Reported on 04/28/2023)     metoprolol  succinate (TOPROL  XL) 25 MG 24 hr tablet Take 1 tablet (25 mg total) by mouth daily. (Patient not taking: Reported on 04/28/2023) 90 tablet 3   valACYclovir (VALTREX) 1000 MG tablet Take 1,000 mg by mouth 3 (three) times daily. (Patient not taking: Reported on 04/28/2023)     Current Facility-Administered Medications  Medication Dose Route Frequency Provider Last Rate Last Admin   triamcinolone  acetonide (KENALOG ) 10 MG/ML injection 10 mg  10 mg Other Once Stover, Titorya, DPM        LABS/IMAGING: No results found for this or any previous visit (from the past 48 hours). No results found.  WEIGHTS: Wt Readings from Last 3 Encounters:  04/28/23 209 lb 9.6 oz (95.1 kg)  03/01/23 208 lb 9.6 oz (94.6 kg)  08/27/22 225 lb 9.6 oz (102.3 kg)    VITALS: BP 118/76 (BP Location: Left Arm, Patient Position: Sitting)   Pulse 70   Ht 5' 5 (1.651 m)   Wt 209 lb 9.6 oz (95.1 kg)   SpO2 97%   BMI 34.88 kg/m   EXAM: General appearance: alert and no distress Neck: no carotid bruit and no JVD Lungs: clear to auscultation bilaterally Heart: regular rate and rhythm, S1, S2 normal, no murmur, click, rub or gallop Abdomen: soft, non-tender; bowel sounds normal; no masses,  no organomegaly Extremities: extremities normal, atraumatic, no cyanosis or edema and varicose veins noted Pulses: 2+ and symmetric Skin: Skin color, texture, turgor normal. No rashes or lesions Neurologic: Grossly normal Psych: Moderately anxious  EKG: EKG Interpretation Date/Time:  Thursday April 28 2023 08:33:36 EST Ventricular  Rate:  70 PR Interval:  138 QRS Duration:  94 QT Interval:  404 QTC Calculation: 436 R Axis:   -24  Text Interpretation: Normal sinus rhythm Incomplete right bundle branch block Possible Septal infarct , age undetermined When compared with ECG of 25-Feb-2019 21:25, Incomplete right bundle branch block is now Present Possible Septal infarct is now Present Confirmed by Mona Kent [phone]) on 04/28/2023 8:42:16 AM    ASSESSMENT: Inappropriate tachycardia-resolved Recurrent TIA without imaging evidence of stroke PFO with right to left shunt Low normal LVEF 50 to 55% (2019)-improved to 55 to 60% (10/2021) Normal CT coronary angiogram-0 CAC, no CAD (10/2021) Bovine aortic arch Bilateral leg pain and swelling Palpitations - PAC's and PVC's (improved on diltiazem ) Dyspnea on exertion - normal systolic function and mild diastolic dysfunction Essential hypertension Atypical chest wall pain-likely related to fibromyalgia Hypersomnolence  PLAN: 1.   Mrs. Zuk seems to be doing well without any worsening shortness of breath or chest pain.  Most of the symptoms  she had last year have resolved.  She is come off of the metoprolol  due to lower blood pressure but blood pressure appears to be normal today.  Cholesterol had been well-controlled except for minimally elevated triglycerides.  Coronary CT was reassuring showing no evidence of coronary artery disease.  Her PFO closure has been stable.  I would advise no changes to her medicines today.  Plan follow-up with us  annually or sooner as necessary.  Vinie KYM Maxcy, MD, [facility], FACP  [place]  [facility]  Medical Director of the Advanced Lipid Disorders &  Cardiovascular Risk Reduction Clinic Diplomate of the American Board of Clinical Lipidology Attending Cardiologist  Direct Dial: [phone]  Fax: [phone]  Website:  [email or web address] Vinie BROCKS Soyla Bainter 04/28/2023, 8:42 AM

## 2023-04-28 NOTE — Patient Instructions (Addendum)
   1st Floor: - Lobby - Registration  - Pharmacy  - Lab - Cafe  2nd Floor: - PV Lab - Diagnostic Testing (echo, CT, nuclear med)  3rd Floor: - Vacant  4th Floor: - TCTS (cardiothoracic surgery) - AFib Clinic - Structural Heart Clinic - Vascular Surgery  - Vascular Ultrasound  5th Floor: - HeartCare Cardiology (general and EP) - Clinical Pharmacy for coumadin, hypertension, lipid, weight-loss medications, and med management appointments    Valet parking services will be available as well.       Medication Instructions:  CONTINUE current medications *If you need a refill on your cardiac medications before your next appointment, please call your pharmacy*   Follow-Up: At [facility], you and your health needs are our priority.  As part of our continuing mission to provide you with exceptional heart care, we have created designated Provider Care Teams.  These Care Teams include your primary Cardiologist (physician) and Advanced Practice Providers (APPs -  Physician Assistants and Nurse Practitioners) who all work together to provide you with the care you need, when you need it.  We recommend signing up for the patient portal called MyChart.  Sign up information is provided on this After Visit Summary.  MyChart is used to connect with patients for Virtual Visits (Telemedicine).  Patients are able to view lab/test results, encounter notes, upcoming appointments, etc.  Non-urgent messages can be sent to your provider as well.   To learn more about what you can do with MyChart, go to [email or web address].    Your next appointment:   12 months with Dr. Mona

## 2023-05-18 ENCOUNTER — Other Ambulatory Visit: Payer: Self-pay | Admitting: Obstetrics and Gynecology

## 2023-05-18 DIAGNOSIS — Z1231 Encounter for screening mammogram for malignant neoplasm of breast: Secondary | ICD-10-CM

## 2023-06-21 ENCOUNTER — Ambulatory Visit: Payer: Medicare PPO

## 2023-06-28 ENCOUNTER — Ambulatory Visit
Admission: RE | Admit: 2023-06-28 | Discharge: 2023-06-28 | Disposition: A | Discharge status: Home / Self Care | Source: Ambulatory Visit | Attending: Obstetrics and Gynecology | Admitting: Obstetrics and Gynecology

## 2023-06-28 DIAGNOSIS — Z1231 Encounter for screening mammogram for malignant neoplasm of breast: Secondary | ICD-10-CM

## 2023-07-12 DIAGNOSIS — E039 Hypothyroidism, unspecified: Secondary | ICD-10-CM | POA: Diagnosis not present

## 2023-07-12 DIAGNOSIS — K219 Gastro-esophageal reflux disease without esophagitis: Secondary | ICD-10-CM | POA: Diagnosis not present

## 2023-07-12 DIAGNOSIS — I10 Essential (primary) hypertension: Secondary | ICD-10-CM | POA: Diagnosis not present

## 2023-07-12 DIAGNOSIS — E119 Type 2 diabetes mellitus without complications: Secondary | ICD-10-CM | POA: Diagnosis not present

## 2023-07-12 DIAGNOSIS — J4520 Mild intermittent asthma, uncomplicated: Secondary | ICD-10-CM | POA: Diagnosis not present

## 2023-07-12 DIAGNOSIS — E7800 Pure hypercholesterolemia, unspecified: Secondary | ICD-10-CM | POA: Diagnosis not present

## 2023-08-29 ENCOUNTER — Ambulatory Visit (INDEPENDENT_AMBULATORY_CARE_PROVIDER_SITE_OTHER): Payer: Medicare PPO | Admitting: Internal Medicine

## 2023-08-29 ENCOUNTER — Encounter: Payer: Self-pay | Admitting: Internal Medicine

## 2023-08-29 VITALS — BP 118/72 | HR 78 | Ht 65.0 in | Wt 193.2 lb

## 2023-08-29 DIAGNOSIS — E063 Autoimmune thyroiditis: Secondary | ICD-10-CM

## 2023-08-29 LAB — TSH: TSH: 0.12 m[IU]/L — ABNORMAL LOW (ref 0.40–4.50)

## 2023-08-29 LAB — T4, FREE: Free T4: 1.7 ng/dL (ref 0.8–1.8)

## 2023-08-29 NOTE — Patient Instructions (Signed)
 Please continue levothyroxine 100 mcg daily.  Take the thyroid hormone every day, with water, at least 30 minutes before breakfast, separated by at least 4 hours from: - acid reflux medications - calcium - iron - multivitamins  Please stop at the lab.  Please return for a follow-up appointment in 6 months.

## 2023-08-29 NOTE — Progress Notes (Signed)
 Patient ID: Glenda Evans, female   DOB: 02-07-1956, 68 y.o.   MRN: [id number]  HPI  Glenda Evans is a 68 y.o.-year-old female, initially referred by her PCP, Dr. Raquel Cables, returning for follow-up for uncontrolled Hashimoto's hypothyroidism.  She is a cousin of Harlon Light, who is also my patient.  Last visit 6 months ago.  Interim history: She feels well at today's visit, without complaints. She has been on Mounjaro, but could not tolerate this due to nausea and vomiting. She restarted it 3-4 mo ago and lost approximately 15 pounds since then.  However, she does have some dizziness and also nausea - on Zofran . Goal: 160 lbs -she plans to stop the medication after she gets to her goal weight.  Reviewed history: Pt. has been dx with hypothyroidism at 68 y/o (1976): hair loss, dysphagia.  She has been on brand-name and generic levothyroxine  since then.  Pt. is on levothyroxine  100 mcg daily, taken (last dose decrease 12/2022): - in am - fasting - at least 1h from b'fast - no calcium  - no iron - + multivitamins: 1-2h later >> off and seldom taking them now - + PPIs - Nexium - midday + evening - not on Biotin  I reviewed pt's thyroid  tests: Lab Results  Component Value Date   TSH 1.08 03/01/2023   Lab Results  Component Value Date   TSH 0.04 (L) 12/30/2022   TSH 0.12 (L) 11/15/2022   TSH 0.36 08/27/2022   TSH 2.390 10/08/2021   TSH 1.420 05/24/2019   TSH 1.579  11/09/2006  06/22/2022: TSH >4 (reportedly, above target, admits to skipping few doses of LT4)  Antithyroid antibodies: No results found for: "THGAB" No components found for: "TPOAB" However, she was told at diagnosis that she had Hashimoto's thyroiditis.  She previously had fatigue and hair loss, which resolved. She denies: - cold intolerance - constipation - dry skin  Pt denies: - feeling nodules in neck - hoarseness She previously had dysphagia, now much improved.  Reviewed previous investigation: Barium  swallow (10/22/2015): Fluoroscopic evaluation of swallowing demonstrates disruption of 2 out of 3 primary esophageal peristaltic waves. No fixed stricture, fold thickening or mass. Small hiatal hernia. The patient swallowed a 13 mm barium tablet which freely passed into the stomach. No reflux with the water siphon maneuver.   IMPRESSION: Nonspecific esophageal motility disorder. Small hiatal hernia.  Swallow evaluation (10/22/2015): CH[facility] IP CLINICAL IMPRESSIONS 10/22/2015  Therapy Diagnosis Mild pharyngeal phase dysphagia  Clinical Impression Pt presents with mild pharyngeal dysphagia, characterized by delayed swallow reflex due to decreased sensation for primary trigger site. Trigger was noted to occur at the level of the pyriform on thin liquids, but was timely for other consistencies. No penetration, aspiration, or post-swalow residue was noted on this study.  Regular Barium Swallow was completed following the MBS, and a hiatal hernia was identified. For this reason, pt was given information regarding behavioral and dietary strategies for management of esophageal dysmotility. No further ST intervenrion is recommended at htis time. Please reconsult if needs arise.  Impact on safety and function Mild aspiration risk   EGD (10/28/2015): Surgical [P], gastric body and gastric polyp - BENIGN GASTRIC MUCOSA AND BENIGN FUNDIC GLAND POLYP. - WARTHIN-STARRY STAIN NEGATIVE FOR HELICOBACTER PYLORI. - NO INTESTINAL METAPLASIA, DYSPLASIA OR MALIGNANCY.  Thyroid  ultrasound (11/13/2021): Parenchymal Echotexture: Mildly heterogenous  Isthmus: 1 mm  Right lobe: 3.3 x 0.7 x 0.9 cm  Left lobe: 3.2 x 0.5 x 0.9 cm  _________________________________________________________   Estimated  total number of nodules >/= 1 cm: 0 _________________________________________________________   Atrophic mildly heterogeneous thyroid . Negative for nodule. No hypervascularity or regional adenopathy.   IMPRESSION: Atrophic  heterogeneous thyroid  gland compatible with sequelae from chronic medical thyroid  disease. Negative for nodule.  She has + FH of thyroid  disorders in: mother - on LT4 25 mcg, MGM, M aunt. No FH of thyroid  cancer. No h/o radiation tx to head or neck. No recent steroid use - she is allergic to Prednisone.  Pt. also has a history of diabetes (well-controlled/managed by PCP), hyperlipidemia, hypertension, vitamin D  deficiency, obesity, GERD, IBS, fibromyalgia, depression/anxiety, insomnia, migraine.   She also has mitral valve prolapse - sees Dr. Maximo Spar.  ROS: + see HPI  Reviewed: Past Medical History:  Diagnosis Date   Allergy    Anxiety    Arthritis    Asthma    Cataract    right eye    Depression    Fatigue    Fibromyalgia    GERD (gastroesophageal reflux disease)    Heart murmur    HLD (hyperlipidemia)    Hypertension    Hypothyroidism    IBS (irritable bowel syndrome)    Insomnia    Migraine    Mitral valve prolapse    Osteopenia    Pre-diabetes    PVC (premature ventricular contraction)    Stein-Leventhal syndrome    Wears glasses    Past Surgical History:  Procedure Laterality Date   ABDOMINAL HYSTERECTOMY     BUBBLE STUDY  01/04/2019   Procedure: BUBBLE STUDY;  Surgeon: Hazle Lites, MD;  Location: [facility] ENDOSCOPY;  Service: Cardiovascular;;   CATARACT EXTRACTION Bilateral 2023   CESAREAN SECTION     x 2   CHOLECYSTECTOMY     COLONOSCOPY     LIPOMA EXCISION  11/26/2010   thigh x3 cyst   OVARY SURGERY     to poke holes in ovaries for PCOD   PATENT FORAMEN OVALE(PFO) CLOSURE N/A 02/15/2019   Procedure: PATENT FORAMEN OVALE (PFO) CLOSURE;  Surgeon: Arnoldo Lapping, MD;  Location: [facility] INVASIVE CV LAB;  Service: Cardiovascular;  Laterality: N/A;   TEE WITHOUT CARDIOVERSION N/A 01/04/2019   Procedure: TRANSESOPHAGEAL ECHOCARDIOGRAM (TEE);  Surgeon: Hazle Lites, MD;  Location: [facility] ENDOSCOPY;  Service: Cardiovascular;  Laterality: N/A;   TONSILLECTOMY      Social History   Socioeconomic History   Marital status: Divorced    Spouse name: Not on file   Number of children: 2   Years of education: Not on file   Highest education level: Not on file  Occupational History   Occupation: disabled   Occupation: retired  Tobacco Use   Smoking status: Never   Smokeless tobacco: Never  Substance and Sexual Activity   Alcohol use: No    Alcohol/week: 0.0 standard drinks of alcohol   Drug use: No   Sexual activity: Not on file  Other Topics Concern   Not on file  Social History Narrative   Not on file   Social Drivers of Health   Financial Resource Strain: Not on file  Food Insecurity: Not on file  Transportation Needs: Not on file  Physical Activity: Not on file  Stress: Not on file  Social Connections: Not on file  Intimate Partner Violence: Not on file   Current Outpatient Medications on File Prior to Visit  Medication Sig Dispense Refill   Accu-Chek FastClix Lancets MISC FOR USE WHEN CHECKING BLOODSUGARS AS DIRECTED ONCE DAILY DX E11.9 102 DAYS  ACCU-CHEK GUIDE test strip USE AS DIRECTED ONCE A DAY     albuterol  (PROVENTIL  HFA;VENTOLIN  HFA) 108 (90 BASE) MCG/ACT inhaler Inhale 2 puffs into the lungs every 6 (six) hours as needed for wheezing or shortness of breath.      aspirin  EC 81 MG tablet Take 81 mg by mouth daily.     atorvastatin  (LIPITOR) 40 MG tablet TAKE 1 TABLET (40 MG TOTAL) BY MOUTH DAILY AT 6 PM. 90 tablet 3   Black Cohosh 40 MG CAPS Take 40 mg by mouth daily.      diltiazem  (CARDIZEM  CD) 360 MG 24 hr capsule TAKE 1 CAPSULE BY MOUTH EVERY DAY 90 capsule 3   levothyroxine  (SYNTHROID ) 100 MCG tablet Take 1 tablet (100 mcg total) by mouth daily. 90 tablet 3   losartan  (COZAAR ) 100 MG tablet TAKE 1 TABLET BY MOUTH EVERY DAY 90 tablet 1   magnesium  oxide (MAG-OX) 400 (240 Mg) MG tablet TAKE 1 TABLET BY MOUTH EVERY DAY 30 tablet 1   metFORMIN (GLUCOPHAGE-XR) 500 MG 24 hr tablet Take 500 mg by mouth daily.       Multiple Vitamin (MULTIVITAMIN WITH MINERALS) TABS tablet Take 1 tablet by mouth daily. Vita Fusion     Nutritional Supplements (ESTROVEN PO) Take 1 tablet by mouth daily.      Potassium Chloride  ER 20 MEQ TBCR TAKE 1 TABLET BY MOUTH EVERY DAY 90 tablet 3   Vitamin D , Ergocalciferol , (DRISDOL ) 1.25 MG (50000 UNIT) CAPS capsule Take 50,000 Units by mouth once a week.     Current Facility-Administered Medications on File Prior to Visit  Medication Dose Route Frequency Provider Last Rate Last Admin   triamcinolone  acetonide (KENALOG ) 10 MG/ML injection 10 mg  10 mg Other Once Stover, Titorya, DPM       Allergies  Allergen Reactions   Carvedilol  Shortness Of Breath    Felt like she had cotton in her throat- felt like it was hard to breath   Erythromycin Shortness Of Breath and Swelling   Lyrica [Pregabalin] Shortness Of Breath and Swelling   Metaxalone Shortness Of Breath   Nitrofurantoin Macrocrystal Swelling and Shortness Of Breath   Ofloxacin Shortness Of Breath and Swelling   Pamelor Shortness Of Breath and Swelling   Prednisone Shortness Of Breath and Swelling   Relafen [Nabumetone] Shortness Of Breath and Swelling   Septra [Bactrim] Shortness Of Breath and Swelling   Sulfasalazine Shortness Of Breath   Escitalopram Oxalate Other (See Comments)    Unsure of exact reaction type   Nitrofurantoin Other (See Comments)   Nortriptyline Other (See Comments)   Sulfa Antibiotics Other (See Comments)   Sulfamethoxazole-Trimethoprim Other (See Comments)   Tricyclic Antidepressants Other (See Comments)   Family History  Problem Relation Age of Onset   Colon polyps Mother    Heart disease Mother        MVP   COPD Father    Hypertension Father    Colon polyps Father    Hypertension Sister    Diabetes Sister    Colon polyps Sister    Heart disease Sister        MVP   Colon polyps Maternal Aunt        x 2   Diabetes Maternal Aunt    COPD Maternal Aunt    Ovarian cancer Paternal Aunt     Breast cancer Maternal Grandmother    Colon polyps Maternal Grandmother    Diabetes Maternal Grandmother    Heart disease Maternal Grandmother  COPD Maternal Grandmother    Colon cancer Neg Hx    Esophageal cancer Neg Hx    Rectal cancer Neg Hx    Stomach cancer Neg Hx    PE: BP 118/72   Pulse 78   Ht 5\' 5"  (1.651 m)   Wt 193 lb 3.2 oz (87.6 kg)   SpO2 97%   BMI 32.15 kg/m  Wt Readings from Last 3 Encounters:  08/29/23 193 lb 3.2 oz (87.6 kg)  04/28/23 209 lb 9.6 oz (95.1 kg)  03/01/23 208 lb 9.6 oz (94.6 kg)   Constitutional: overweight, in NAD Eyes:  EOMI, no exophthalmos ENT: no neck masses, no cervical lymphadenopathy Cardiovascular: RRR, No MRG Respiratory: CTA B Musculoskeletal: no deformities Skin:no rashes Neurological: no tremor with outstretched hands  ASSESSMENT: 1. Hypothyroidism  2.  Dysphagia  PLAN:  1. Patient with longstanding hypothyroidism, on levothyroxine  therapy - latest thyroid  labs reviewed with pt. >> normal: Lab Results  Component Value Date   TSH 1.08 03/01/2023  - she continues on LT4 100 mcg daily - pt feels good on this dose.  She previously had fatigue, hair loss, weight gain.  She tried Mounjaro, but had to stop before last visit due to GI symptoms. She still lost 17 pounds before last visit.  Since then, she restarted Mounjaro and lost another 15 pounds.  We discussed that with significant weight loss, we may need to keep decreasing her LT4 dose. - we discussed about taking the thyroid  hormone every day, with water, >30 minutes before breakfast, separated by >4 hours from acid reflux medications, calcium , iron, multivitamins. Pt. is taking it correctly.  She was previously taking multivitamins only 1 to 2 hours after levothyroxine  and we discussed about moving these at least 4 hours later, but she stopped the multivitamin before last visit >> now intermittently. - will check thyroid  tests today: TSH and fT4 - If labs are abnormal,  she will need to return for repeat TFTs in 1.5 months - I will see her back in 1 year, but in 6 months for lab.  2.  Dysphagia - Reviewing previous investigation, she had mild pharyngeal phase dysphagia on swallowing evaluation from 2017.  She also had a mild hiatal hernia which could have contributed.  An EGD did not show other pathology.  However, she does have reflux and is on esomeprazole.  This could contribute. - She had a thyroid  ultrasound in 2023 which showed a diminutive thyroid , without any nodules, which was unlikely to contribute to her dysphagia - No masses felt on palpation of her neck today - Will continue to follow her clinically for this  Needs refills - 90 days (even if we end up changing the dose) [facility].  Orders Placed This Encounter  Procedures   TSH   T4, free   Emilie Harden, MD PhD [facility] Endocrinology

## 2023-08-30 ENCOUNTER — Ambulatory Visit: Payer: Self-pay | Admitting: Internal Medicine

## 2023-08-30 DIAGNOSIS — E063 Autoimmune thyroiditis: Secondary | ICD-10-CM

## 2023-08-30 MED ORDER — LEVOTHYROXINE SODIUM 100 MCG PO TABS: 100.0000 ug | ORAL_TABLET | ORAL | 3 refills | Status: DC

## 2023-08-30 MED ORDER — LEVOTHYROXINE SODIUM 88 MCG PO TABS: 88.0000 ug | ORAL_TABLET | ORAL | 3 refills | Status: DC

## 2023-08-30 NOTE — Addendum Note (Signed)
 Addended by: Emilie Harden on: 08/30/2023 12:27 PM   Modules accepted: Orders

## 2023-10-17 ENCOUNTER — Other Ambulatory Visit

## 2023-10-17 DIAGNOSIS — E063 Autoimmune thyroiditis: Secondary | ICD-10-CM | POA: Diagnosis not present

## 2023-10-18 LAB — TSH: TSH: 0.08 m[IU]/L — ABNORMAL LOW (ref 0.40–4.50)

## 2023-10-18 LAB — T4, FREE: Free T4: 1.8 ng/dL (ref 0.8–1.8)

## 2023-10-18 MED ORDER — LEVOTHYROXINE SODIUM 88 MCG PO TABS: 88.0000 ug | ORAL_TABLET | Freq: Every day | ORAL | 2 refills | Status: DC

## 2023-10-18 MED ORDER — LEVOTHYROXINE SODIUM 88 MCG PO TABS: 88.0000 ug | ORAL_TABLET | Freq: Every day | ORAL | 5 refills | Status: DC

## 2023-11-28 ENCOUNTER — Other Ambulatory Visit

## 2023-11-28 DIAGNOSIS — E063 Autoimmune thyroiditis: Secondary | ICD-10-CM | POA: Diagnosis not present

## 2023-11-29 ENCOUNTER — Other Ambulatory Visit: Payer: Self-pay | Admitting: Internal Medicine

## 2023-11-29 LAB — T4, FREE: Free T4: 1.5 ng/dL (ref 0.8–1.8)

## 2023-11-29 LAB — TSH: TSH: 0.22 m[IU]/L — ABNORMAL LOW (ref 0.40–4.50)

## 2023-11-29 MED ORDER — LEVOTHYROXINE SODIUM 75 MCG PO TABS: 75.0000 ug | ORAL_TABLET | Freq: Every day | ORAL | 1 refills | Status: AC

## 2023-11-29 NOTE — Addendum Note (Signed)
 Addended by: TRIXIE FILE on: 11/29/2023 03:33 PM   Modules accepted: Orders

## 2024-01-23 ENCOUNTER — Other Ambulatory Visit

## 2024-01-23 DIAGNOSIS — E063 Autoimmune thyroiditis: Secondary | ICD-10-CM | POA: Diagnosis not present

## 2024-01-24 LAB — TSH: TSH: 2.22 m[IU]/L (ref 0.40–4.50)

## 2024-01-24 LAB — T4, FREE: Free T4: 1.2 ng/dL (ref 0.8–1.8)

## 2024-01-26 DIAGNOSIS — E119 Type 2 diabetes mellitus without complications: Secondary | ICD-10-CM | POA: Diagnosis not present

## 2024-01-26 DIAGNOSIS — Z23 Encounter for immunization: Secondary | ICD-10-CM | POA: Diagnosis not present

## 2024-01-26 DIAGNOSIS — K219 Gastro-esophageal reflux disease without esophagitis: Secondary | ICD-10-CM | POA: Diagnosis not present

## 2024-01-26 DIAGNOSIS — J4520 Mild intermittent asthma, uncomplicated: Secondary | ICD-10-CM | POA: Diagnosis not present

## 2024-01-26 DIAGNOSIS — I10 Essential (primary) hypertension: Secondary | ICD-10-CM | POA: Diagnosis not present

## 2024-01-26 DIAGNOSIS — Z1211 Encounter for screening for malignant neoplasm of colon: Secondary | ICD-10-CM | POA: Diagnosis not present

## 2024-01-26 DIAGNOSIS — E7800 Pure hypercholesterolemia, unspecified: Secondary | ICD-10-CM | POA: Diagnosis not present

## 2024-01-26 DIAGNOSIS — E039 Hypothyroidism, unspecified: Secondary | ICD-10-CM | POA: Diagnosis not present

## 2024-01-26 DIAGNOSIS — Z0000 Encounter for general adult medical examination without abnormal findings: Secondary | ICD-10-CM | POA: Diagnosis not present

## 2024-02-15 ENCOUNTER — Encounter: Payer: Self-pay | Admitting: Internal Medicine

## 2024-03-27 DIAGNOSIS — H43813 Vitreous degeneration, bilateral: Secondary | ICD-10-CM | POA: Diagnosis not present

## 2024-03-27 DIAGNOSIS — H35033 Hypertensive retinopathy, bilateral: Secondary | ICD-10-CM | POA: Diagnosis not present

## 2024-03-27 DIAGNOSIS — H353132 Nonexudative age-related macular degeneration, bilateral, intermediate dry stage: Secondary | ICD-10-CM | POA: Diagnosis not present

## 2024-03-27 DIAGNOSIS — E119 Type 2 diabetes mellitus without complications: Secondary | ICD-10-CM | POA: Diagnosis not present

## 2024-03-28 ENCOUNTER — Other Ambulatory Visit: Payer: Self-pay | Admitting: Medical Genetics

## 2024-04-04 ENCOUNTER — Ambulatory Visit

## 2024-04-04 VITALS — Ht 65.0 in | Wt 174.0 lb

## 2024-04-04 DIAGNOSIS — Z860100 Personal history of colon polyps, unspecified: Secondary | ICD-10-CM

## 2024-04-04 NOTE — Progress Notes (Signed)
 No issues known to pt with past sedation with any surgeries or procedures Patient denies ever being told they had issues or difficulty with intubation  No FH of Malignant Hyperthermia Pt is not on diet pills; is taking GLP-1 medication Pt is not on  home 02  Pt is not on blood thinners  Pt states ongoing issues with constipation; typically has BM q3days  No A fib or A flutter Have any cardiac testing pending--no Pt instructed to use [employer] or GoodRx for a price reduction on prep  Ambulates independently

## 2024-04-05 ENCOUNTER — Other Ambulatory Visit

## 2024-04-05 DIAGNOSIS — Z006 Encounter for examination for normal comparison and control in clinical research program: Secondary | ICD-10-CM

## 2024-04-08 ENCOUNTER — Encounter: Payer: Self-pay | Admitting: Internal Medicine

## 2024-04-13 ENCOUNTER — Telehealth: Payer: Self-pay | Admitting: Internal Medicine

## 2024-04-13 NOTE — Telephone Encounter (Signed)
 Patient informed of Colleen's message below. Encouraged to hydrate well before and after her colonoscopy. Encouraged to call our office if her urinary symptoms return prior to her procedure. Patient verbalized understanding and no further questions asked.

## 2024-04-13 NOTE — Telephone Encounter (Signed)
 Call from pt stating she is scheduled for procedure on 12/30 and recently when to ER. Pt stated she had bladder infection Pt requesting call to discuss further. Please advise, thank you Sending to Pod due to [facility] being closed

## 2024-04-13 NOTE — Telephone Encounter (Signed)
 Patient called back. The ER called her to inform her about her urine culture. The culture did grown E. Coli. They changed her antibiotic to Omnicef 300 mg BID x 7 days. She is going to get this now and start. She just wanted to let us  know & to make sure she was still ok to proceed as she really wants to get this over with.

## 2024-04-13 NOTE — Telephone Encounter (Signed)
 Spoke to patient. Patient was seen in the ER 04/11/24 & was diagnosed with a UTI. Taking Cephalexin TID. Patient states she is feeling much better and denies any fever. Patient wants to make sure she will be ok for her upcoming procedure on 04/17/24.

## 2024-04-13 NOTE — Telephone Encounter (Signed)
 Glenda Evans, as long as UTI was noncomplex and her symptoms have completely resolved after taking antibiotic for UTI ok for her to proceed with a colonoscopy 04/17/2024 as planned. Please make sure patient continues to hydrate well before and after her colonoscopy. If she has any recurrence of urinary symptoms prior to her colonoscopy date, patient will need to contact our office. THX.

## 2024-04-13 NOTE — Telephone Encounter (Signed)
 Glenda Evans, thank you for the additional update. If patient feels ill or is having active UTI symptoms then I advise cancelling her colonoscopy 12/30. Otherwise, if she is tolerating the Omnicef without any GI distress or worsening UTI symptoms, she can proceed with the colonoscopy on 12/30. I think it would be best to check on patient Monday 12/29 in morning. I will not be in the clinic on 12/29 and will be working at [facility]. Can you please call the patient Monday 12/29 morning and provide further update. THX.

## 2024-04-16 NOTE — Telephone Encounter (Signed)
 Agree, ok for colonoscopy tomorrow as scheduled Thanks JMP

## 2024-04-16 NOTE — Telephone Encounter (Signed)
 Spoke with patient. Patient states she has done fine with the antibiotic change & is feeling fine. Patient instructed on Colleen's note below & will proceed with colonoscopy tomorrow.

## 2024-04-17 ENCOUNTER — Ambulatory Visit: Admitting: Internal Medicine

## 2024-04-17 ENCOUNTER — Encounter: Payer: Self-pay | Admitting: Internal Medicine

## 2024-04-17 VITALS — BP 116/72 | HR 64 | Temp 97.3°F | Resp 12 | Ht 65.0 in | Wt 174.0 lb

## 2024-04-17 DIAGNOSIS — K5730 Diverticulosis of large intestine without perforation or abscess without bleeding: Secondary | ICD-10-CM | POA: Diagnosis not present

## 2024-04-17 DIAGNOSIS — Z860101 Personal history of adenomatous and serrated colon polyps: Secondary | ICD-10-CM | POA: Diagnosis not present

## 2024-04-17 DIAGNOSIS — D124 Benign neoplasm of descending colon: Secondary | ICD-10-CM

## 2024-04-17 DIAGNOSIS — Z8371 Family history of adenomatous and serrated polyps: Secondary | ICD-10-CM | POA: Diagnosis not present

## 2024-04-17 DIAGNOSIS — D123 Benign neoplasm of transverse colon: Secondary | ICD-10-CM

## 2024-04-17 DIAGNOSIS — Z1211 Encounter for screening for malignant neoplasm of colon: Secondary | ICD-10-CM

## 2024-04-17 DIAGNOSIS — Z860100 Personal history of colon polyps, unspecified: Secondary | ICD-10-CM

## 2024-04-17 LAB — GENECONNECT MOLECULAR SCREEN: Genetic Analysis Overall Interpretation: NEGATIVE

## 2024-04-17 MED ORDER — SODIUM CHLORIDE 0.9 % IV SOLN: 500.0000 mL | Freq: Once | INTRAVENOUS | Status: DC

## 2024-04-17 NOTE — Op Note (Signed)
 [facility] Patient Name: Glenda Evans Procedure Date: 04/17/2024 11:12 AM MRN: [id number] Endoscopist: Gordy CHRISTELLA Starch , MD, [id number] Age: 68 Referring MD:  Date of Birth: February 27, 1956 Gender: Female Account #: [id number] Procedure:                Colonoscopy Indications:              High risk colon cancer surveillance: Personal                            history of non-advanced adenoma, Family history of                            adenomatous polyps, Last colonoscopy: February 2019                            (TA x 1) Medicines:                Monitored Anesthesia Care Procedure:                Pre-Anesthesia Assessment:                           - Prior to the procedure, a History and Physical                            was performed, and patient medications and                            allergies were reviewed. The patient's tolerance of                            previous anesthesia was also reviewed. The risks                            and benefits of the procedure and the sedation                            options and risks were discussed with the patient.                            All questions were answered, and informed consent                            was obtained. Prior Anticoagulants: The patient has                            taken no anticoagulant or antiplatelet agents. ASA                            Grade Assessment: II - A patient with mild systemic                            disease. After reviewing the risks and benefits,  the patient was deemed in satisfactory condition to                            undergo the procedure.                           After obtaining informed consent, the colonoscope                            was passed under direct vision. Throughout the                            procedure, the patient's blood pressure, pulse, and                            oxygen  saturations were monitored continuously.  The                            CF [id number] #[id number] was introduced through the anus                            and advanced to the cecum, identified by                            appendiceal orifice and ileocecal valve. The                            colonoscopy was performed without difficulty. The                            patient tolerated the procedure well. The quality                            of the bowel preparation was good. The ileocecal                            valve, appendiceal orifice, and rectum were                            photographed. Scope In: 11:29:38 AM Scope Out: 11:51:05 AM Scope Withdrawal Time: 0 hours 17 minutes 3 seconds  Total Procedure Duration: 0 hours 21 minutes 27 seconds  Findings:                 The digital rectal exam was normal.                           Three sessile polyps were found in the transverse                            colon. The polyps were 3 to 6 mm in size. These                            polyps were removed with a cold snare. Resection  and retrieval were complete.                           Seven sessile polyps were found in the descending                            colon. The polyps were 3 to 6 mm in size. These                            polyps were removed with a cold snare. Resection                            and retrieval were complete.                           Multiple medium-mouthed and small-mouthed                            diverticula were found in the sigmoid colon.                           The retroflexed view of the distal rectum and anal                            verge was normal and showed no anal or rectal                            abnormalities. Complications:            No immediate complications. Estimated Blood Loss:     Estimated blood loss: none. Impression:               - Three 3 to 6 mm polyps in the transverse colon,                            removed with a cold snare.  Resected and retrieved.                           - Seven 3 to 6 mm polyps in the descending colon,                            removed with a cold snare. Resected and retrieved.                           - Mild diverticulosis in the sigmoid colon.                           - The distal rectum and anal verge are normal on                            retroflexion view. Recommendation:           - Patient has a contact number available for  emergencies. The signs and symptoms of potential                            delayed complications were discussed with the                            patient. Return to normal activities tomorrow.                            Written discharge instructions were provided to the                            patient.                           - Resume previous diet.                           - Continue present medications.                           - Await pathology results.                           - Repeat colonoscopy is recommended for                            surveillance. The colonoscopy date will be                            determined after pathology results from today's                            exam become available for review. Gordy CHRISTELLA Starch, MD 04/17/2024 11:55:51 AM This report has been signed electronically.

## 2024-04-17 NOTE — Progress Notes (Signed)
 Sedate, gd SR, tolerated procedure well, VSS, report to RN

## 2024-04-17 NOTE — Patient Instructions (Signed)
Discharge instructions given. Handouts on polyps and Diverticulosis. Resume previous medications. YOU HAD AN ENDOSCOPIC PROCEDURE TODAY AT THE [facility]:   Refer to the procedure report that was given to you for any specific questions about what was found during the examination.  If the procedure report does not answer your questions, please call your gastroenterologist to clarify.  If you requested that your care partner not be given the details of your procedure findings, then the procedure report has been included in a sealed envelope for you to review at your convenience later.  YOU SHOULD EXPECT: Some feelings of bloating in the abdomen. Passage of more gas than usual.  Walking can help get rid of the air that was put into your GI tract during the procedure and reduce the bloating. If you had a lower endoscopy (such as a colonoscopy or flexible sigmoidoscopy) you may notice spotting of blood in your stool or on the toilet paper. If you underwent a bowel prep for your procedure, you may not have a normal bowel movement for a few days.  Please Note:  You might notice some irritation and congestion in your nose or some drainage.  This is from the oxygen used during your procedure.  There is no need for concern and it should clear up in a day or so.  SYMPTOMS TO REPORT IMMEDIATELY:  Following lower endoscopy (colonoscopy or flexible sigmoidoscopy):  Excessive amounts of blood in the stool  Significant tenderness or worsening of abdominal pains  Swelling of the abdomen that is new, acute  Fever of 100F or higher  For urgent or emergent issues, a gastroenterologist can be reached at any hour by calling [phone]. Do not use MyChart messaging for urgent concerns.    DIET:  We do recommend a small meal at first, but then you may proceed to your regular diet.  Drink plenty of fluids but you should avoid alcoholic beverages for 24 hours.  ACTIVITY:  You should plan to take it  easy for the rest of today and you should NOT DRIVE or use heavy machinery until tomorrow (because of the sedation medicines used during the test).    FOLLOW UP: Our staff will call the number listed on your records the next business day following your procedure.  We will call around 7:15- 8:00 am to check on you and address any questions or concerns that you may have regarding the information given to you following your procedure. If we do not reach you, we will leave a message.     If any biopsies were taken you will be contacted by phone or by letter within the next 1-3 weeks.  Please call us at [phone] if you have not heard about the biopsies in 3 weeks.    SIGNATURES/CONFIDENTIALITY: You and/or your care partner have signed paperwork which will be entered into your electronic medical record.  These signatures attest to the fact that that the information above on your After Visit Summary has been reviewed and is understood.  Full responsibility of the confidentiality of this discharge information lies with you and/or your care-partner. 

## 2024-04-17 NOTE — Progress Notes (Signed)
 Called to room to assist during endoscopic procedure.  Patient ID and intended procedure confirmed with present staff. Received instructions for my participation in the procedure from the performing physician.

## 2024-04-17 NOTE — Progress Notes (Signed)
Pt. states no medical or surgical changes since previsit or office visit. 

## 2024-04-17 NOTE — Progress Notes (Signed)
 "   GASTROENTEROLOGY PROCEDURE H&P NOTE   Primary Care Physician: Arloa Elsie SAUNDERS, MD    Reason for Procedure:   Hx of colonic polyp/adenoma and family history of colon polyps  Plan:    Surveillance colonoscopy  Patient is appropriate for endoscopic procedure(s) in the ambulatory ([facility]) setting.  The nature of the procedure, as well as the risks, benefits, and alternatives were carefully and thoroughly reviewed with the patient. Ample time for discussion and questions allowed.  All questions were answered. The patient understood, was satisfied, and agreed with the plan to proceed.    HPI: Glenda Evans is a 68 y.o. female who presents for colonoscopy.  Medical history as below.  Tolerated the prep.  No recent chest pain or shortness of breath.  No abdominal pain today.  Past Medical History:  Diagnosis Date   Allergy    Anxiety    Arthritis    Asthma    Cataract    right eye    Depression    Diabetes mellitus without complication (HCC)    Fatigue    Fibromyalgia    GERD (gastroesophageal reflux disease)    Heart murmur    HLD (hyperlipidemia)    Hypertension    Hypothyroidism    IBS (irritable bowel syndrome)    Insomnia    Migraine    Mitral valve prolapse    Osteopenia    Pre-diabetes    PVC (premature ventricular contraction)    Stein-Leventhal syndrome    TIA 2019   Wears glasses     Past Surgical History:  Procedure Laterality Date   ABDOMINAL HYSTERECTOMY     BUBBLE STUDY  01/04/2019   Procedure: BUBBLE STUDY;  Surgeon: Mona Vinie BROCKS, MD;  Location: [facility];  Service: Cardiovascular;;   CATARACT EXTRACTION Bilateral 2023   CESAREAN SECTION     x 2   CHOLECYSTECTOMY     COLONOSCOPY     LIPOMA EXCISION  11/26/2010   thigh x3 cyst   OVARY SURGERY     to poke holes in ovaries for PCOD   PATENT FORAMEN OVALE(PFO) CLOSURE N/A 02/15/2019   Procedure: PATENT FORAMEN OVALE (PFO) CLOSURE;  Surgeon: Wonda Sharper, MD;  Location: [facility] INVASIVE CV  LAB;  Service: Cardiovascular;  Laterality: N/A;   TEE WITHOUT CARDIOVERSION N/A 01/04/2019   Procedure: TRANSESOPHAGEAL ECHOCARDIOGRAM (TEE);  Surgeon: Mona Vinie BROCKS, MD;  Location: [facility] ENDOSCOPY;  Service: Cardiovascular;  Laterality: N/A;   TONSILLECTOMY      Prior to Admission medications  Medication Sig Start Date End Date Taking? Authorizing Provider  Ascorbic Acid (VITAMIN C) 1000 MG tablet Take 1,000 mg by mouth daily.   Yes [provider]  aspirin  EC 81 MG tablet Take 81 mg by mouth daily.   Yes [provider]  atorvastatin  (LIPITOR) 40 MG tablet TAKE 1 TABLET (40 MG TOTAL) BY MOUTH DAILY AT 6 PM. 05/18/19  Yes Hilty, Vinie BROCKS, MD  cefdinir (OMNICEF) 300 MG capsule Take 300 mg by mouth 2 (two) times daily. 04/13/24  Yes [provider]  diltiazem  (CARDIZEM  CD) 360 MG 24 hr capsule TAKE 1 CAPSULE BY MOUTH EVERY DAY 05/26/20  Yes Hilty, Vinie BROCKS, MD  esomeprazole (NEXIUM) 40 MG capsule Take 40 mg by mouth 2 (two) times daily before a meal. 06/02/21  Yes [provider]  esomeprazole (NEXIUM) 40 MG capsule 1 capsule. 06/02/21  Yes [provider]  levothyroxine  (SYNTHROID ) 75 MCG tablet Take 1 tablet (75 mcg total) by  mouth daily before breakfast. 11/29/23  Yes Trixie File, MD  losartan  (COZAAR ) 100 MG tablet TAKE 1 TABLET BY MOUTH EVERY DAY Patient taking differently: Take 25 mg by mouth daily. 01/08/20  Yes Hilty, Vinie BROCKS, MD  magnesium  oxide (MAG-OX) 400 (240 Mg) MG tablet TAKE 1 TABLET BY MOUTH EVERY DAY Patient taking differently: Take 400 mg by mouth daily. 11/02/21  Yes Goodrich, Callie E, PA-C  metFORMIN (GLUCOPHAGE-XR) 500 MG 24 hr tablet Take 500 mg by mouth daily.  03/28/18  Yes [provider]  Nutritional Supplements (ESTROVEN PO) Take 1 tablet by mouth daily.    Yes [provider]  ondansetron  (ZOFRAN -ODT) 4 MG disintegrating tablet Take 4 mg by mouth every 8 (eight) hours as needed. 03/11/23  Yes  [provider]  Potassium Chloride  ER 20 MEQ TBCR TAKE 1 TABLET BY MOUTH EVERY DAY 04/24/20  Yes Wonda Sharper, MD  spironolactone  (ALDACTONE ) 25 MG tablet Take 12.5 mg by mouth once.   Yes [provider]  Vitamin D , Ergocalciferol , (DRISDOL ) 1.25 MG (50000 UNIT) CAPS capsule Take 50,000 Units by mouth once a week. 10/19/19  Yes [provider]  Accu-Chek FastClix Lancets MISC FOR USE WHEN CHECKING BLOODSUGARS AS DIRECTED ONCE DAILY DX E11.9 102 DAYS Patient not taking: No sig reported 05/23/19   [provider]  ACCU-CHEK GUIDE test strip USE AS DIRECTED ONCE A DAY Patient not taking: No sig reported 02/08/19   [provider]  albuterol  (PROVENTIL  HFA;VENTOLIN  HFA) 108 (90 BASE) MCG/ACT inhaler Inhale 2 puffs into the lungs every 6 (six) hours as needed for wheezing or shortness of breath.     [provider]  budesonide-formoterol (SYMBICORT) 160-4.5 MCG/ACT inhaler Inhale 2 puffs into the lungs daily as needed.    [provider]  Multiple Vitamin (MULTI VITAMIN) TABS 1 tablet Orally Once a day; Duration: 30 day(s)    [provider]  Multiple Vitamin (MULTIVITAMIN WITH MINERALS) TABS tablet Take 1 tablet by mouth daily. [employer] Fusion    [provider]  tirzepatide (MOUNJARO) 5 MG/0.5ML Pen 0.5 milliters (Dx E11.9) Subcutaneous once a week for diabetes 07/01/22   [provider]    Current Outpatient Medications  Medication Sig Dispense Refill   Ascorbic Acid (VITAMIN C) 1000 MG tablet Take 1,000 mg by mouth daily.     aspirin  EC 81 MG tablet Take 81 mg by mouth daily.     atorvastatin  (LIPITOR) 40 MG tablet TAKE 1 TABLET (40 MG TOTAL) BY MOUTH DAILY AT 6 PM. 90 tablet 3   cefdinir (OMNICEF) 300 MG capsule Take 300 mg by mouth 2 (two) times daily.     diltiazem  (CARDIZEM  CD) 360 MG 24 hr capsule TAKE 1 CAPSULE BY MOUTH EVERY DAY 90 capsule 3   esomeprazole (NEXIUM) 40 MG capsule Take 40 mg by mouth 2 (two)  times daily before a meal.     esomeprazole (NEXIUM) 40 MG capsule 1 capsule.     levothyroxine  (SYNTHROID ) 75 MCG tablet Take 1 tablet (75 mcg total) by mouth daily before breakfast. 90 tablet 1   losartan  (COZAAR ) 100 MG tablet TAKE 1 TABLET BY MOUTH EVERY DAY (Patient taking differently: Take 25 mg by mouth daily.) 90 tablet 1   magnesium  oxide (MAG-OX) 400 (240 Mg) MG tablet TAKE 1 TABLET BY MOUTH EVERY DAY (Patient taking differently: Take 400 mg by mouth daily.) 30 tablet 1   metFORMIN (GLUCOPHAGE-XR) 500 MG 24 hr tablet Take 500 mg by mouth daily.  Nutritional Supplements (ESTROVEN PO) Take 1 tablet by mouth daily.      ondansetron  (ZOFRAN -ODT) 4 MG disintegrating tablet Take 4 mg by mouth every 8 (eight) hours as needed.     Potassium Chloride  ER 20 MEQ TBCR TAKE 1 TABLET BY MOUTH EVERY DAY 90 tablet 3   spironolactone  (ALDACTONE ) 25 MG tablet Take 12.5 mg by mouth once.     Vitamin D , Ergocalciferol , (DRISDOL ) 1.25 MG (50000 UNIT) CAPS capsule Take 50,000 Units by mouth once a week.     Accu-Chek FastClix Lancets MISC FOR USE WHEN CHECKING BLOODSUGARS AS DIRECTED ONCE DAILY DX E11.9 102 DAYS (Patient not taking: No sig reported)     ACCU-CHEK GUIDE test strip USE AS DIRECTED ONCE A DAY (Patient not taking: No sig reported)     albuterol  (PROVENTIL  HFA;VENTOLIN  HFA) 108 (90 BASE) MCG/ACT inhaler Inhale 2 puffs into the lungs every 6 (six) hours as needed for wheezing or shortness of breath.      budesonide-formoterol (SYMBICORT) 160-4.5 MCG/ACT inhaler Inhale 2 puffs into the lungs daily as needed.     Multiple Vitamin (MULTI VITAMIN) TABS 1 tablet Orally Once a day; Duration: 30 day(s)     Multiple Vitamin (MULTIVITAMIN WITH MINERALS) TABS tablet Take 1 tablet by mouth daily. Vita Fusion     tirzepatide (MOUNJARO) 5 MG/0.5ML Pen 0.5 milliters (Dx E11.9) Subcutaneous once a week for diabetes     Current Facility-Administered Medications  Medication Dose Route Frequency Provider Last  Rate Last Admin   0.9 %  sodium chloride  infusion  500 mL Intravenous Once Pattie Flaharty, Gordy HERO, MD       triamcinolone  acetonide (KENALOG ) 10 MG/ML injection 10 mg  10 mg Other Once Stover, Titorya, DPM        Allergies as of 04/17/2024 - Review Complete 04/17/2024  Allergen Reaction Noted   Carvedilol  Shortness Of Breath 05/24/2019   Erythromycin Shortness Of Breath and Swelling 11/18/2010   Escitalopram oxalate Other (See Comments) 11/18/2010   Lyrica [pregabalin] Shortness Of Breath and Swelling 11/18/2010   Metaxalone Shortness Of Breath 11/18/2010   Nitrofurantoin Other (See Comments) 06/02/2021   Nitrofurantoin macrocrystal Swelling and Shortness Of Breath 02/10/2012   Nortriptyline Other (See Comments) 06/02/2021   Ofloxacin Shortness Of Breath and Swelling 11/18/2010   Pamelor Shortness Of Breath and Swelling 11/18/2010   Prednisone Shortness Of Breath and Swelling 10/27/2021   Relafen [nabumetone] Shortness Of Breath and Swelling 11/18/2010   Septra [bactrim] Shortness Of Breath and Swelling 11/18/2010   Sulfa antibiotics Other (See Comments) 11/18/2010   Sulfamethoxazole-trimethoprim Other (See Comments) 06/02/2021   Sulfasalazine Shortness Of Breath 11/18/2010   Tricyclic antidepressants Other (See Comments) 06/02/2021    Family History  Problem Relation Age of Onset   Colon polyps Mother    Heart disease Mother        MVP   COPD Father    Hypertension Father    Colon polyps Father    Hypertension Sister    Diabetes Sister    Colon polyps Sister    Heart disease Sister        MVP   Colon polyps Maternal Aunt        x 2   Diabetes Maternal Aunt    COPD Maternal Aunt    Ovarian cancer Paternal Aunt    Breast cancer Maternal Grandmother    Colon polyps Maternal Grandmother    Diabetes Maternal Grandmother    Heart disease Maternal Grandmother    COPD Maternal Grandmother  Colon cancer Neg Hx    Esophageal cancer Neg Hx    Rectal cancer Neg Hx    Stomach cancer  Neg Hx     Social History   Socioeconomic History   Marital status: Divorced    Spouse name: Not on file   Number of children: 2   Years of education: Not on file   Highest education level: Not on file  Occupational History   Occupation: disabled   Occupation: retired  Tobacco Use   Smoking status: Never   Smokeless tobacco: Never  Vaping Use   Vaping status: Never Used  Substance and Sexual Activity   Alcohol use: No    Alcohol/week: 0.0 standard drinks of alcohol   Drug use: No   Sexual activity: Not on file  Other Topics Concern   Not on file  Social History Narrative   Not on file   Social Drivers of Health   Tobacco Use: Low Risk (04/17/2024)   Patient History    Smoking Tobacco Use: Never    Smokeless Tobacco Use: Never    Passive Exposure: Not on file  Financial Resource Strain: Not on file  Food Insecurity: Not on file  Transportation Needs: Not on file  Physical Activity: Not on file  Stress: Not on file  Social Connections: Not on file  Intimate Partner Violence: Not on file  Depression (EYV7-0): Not on file  Alcohol Screen: Not on file  Housing: Not on file  Utilities: Not on file  Health Literacy: Not on file    Physical Exam: Vital signs in last 24 hours: @BP  (!) 114/58   Pulse 80   Temp (!) 97.3 F (36.3 C) (Skin)   Ht 5' 5 (1.651 m)   Wt 174 lb (78.9 kg)   SpO2 97%   BMI 28.96 kg/m  GEN: NAD EYE: Sclerae anicteric ENT: MMM CV: Non-tachycardic Pulm: CTA b/l GI: Soft, NT/ND NEURO:  Alert & Oriented x 3   Gordy Starch, MD [facility]  04/17/2024 11:18 AM  "

## 2024-04-20 ENCOUNTER — Telehealth: Payer: Self-pay

## 2024-04-20 LAB — SURGICAL PATHOLOGY

## 2024-04-20 NOTE — Telephone Encounter (Signed)
 Follow up call to pt, no answer.

## 2024-04-23 ENCOUNTER — Ambulatory Visit: Payer: Self-pay | Admitting: Internal Medicine

## 2024-04-25 ENCOUNTER — Encounter: Payer: Self-pay | Admitting: Internal Medicine

## 2024-07-13 ENCOUNTER — Ambulatory Visit: Admitting: Internal Medicine
